# Patient Record
Sex: Female | Born: 1960
Health system: Southern US, Community
[De-identification: ages and names within clinical notes are randomized; demographics above are authoritative.]

## PROBLEM LIST (undated history)

## (undated) DIAGNOSIS — I639 Cerebral infarction, unspecified: Secondary | ICD-10-CM

## (undated) DIAGNOSIS — I10 Essential (primary) hypertension: Secondary | ICD-10-CM

## (undated) DIAGNOSIS — R569 Unspecified convulsions: Secondary | ICD-10-CM

## (undated) HISTORY — PX: TRACHEOSTOMY CLOSURE: SHX458

---

## 2009-08-28 ENCOUNTER — Inpatient Hospital Stay (HOSPITAL_COMMUNITY): Admission: EM | Admit: 2009-08-28 | Discharge: 2009-09-16 | Payer: Self-pay | Admitting: Emergency Medicine

## 2009-08-28 ENCOUNTER — Ambulatory Visit: Payer: Self-pay | Admitting: Cardiovascular Disease

## 2009-08-29 ENCOUNTER — Encounter (INDEPENDENT_AMBULATORY_CARE_PROVIDER_SITE_OTHER): Payer: Self-pay | Admitting: Neurology

## 2009-08-29 ENCOUNTER — Ambulatory Visit: Payer: Self-pay | Admitting: Internal Medicine

## 2009-09-04 ENCOUNTER — Encounter (INDEPENDENT_AMBULATORY_CARE_PROVIDER_SITE_OTHER): Payer: Self-pay | Admitting: Neurology

## 2009-09-05 ENCOUNTER — Ambulatory Visit: Payer: Self-pay | Admitting: Vascular Surgery

## 2009-09-05 ENCOUNTER — Encounter (INDEPENDENT_AMBULATORY_CARE_PROVIDER_SITE_OTHER): Payer: Self-pay | Admitting: Neurology

## 2009-09-07 ENCOUNTER — Ambulatory Visit: Payer: Self-pay | Admitting: Internal Medicine

## 2009-09-13 ENCOUNTER — Encounter: Payer: Self-pay | Admitting: Gastroenterology

## 2009-09-14 ENCOUNTER — Ambulatory Visit: Payer: Self-pay | Admitting: Physical Medicine & Rehabilitation

## 2009-09-16 ENCOUNTER — Inpatient Hospital Stay: Admission: EM | Admit: 2009-09-16 | Discharge: 2009-10-20 | Payer: Self-pay | Admitting: *Deleted

## 2009-09-30 ENCOUNTER — Ambulatory Visit: Payer: Self-pay | Admitting: Pulmonary Disease

## 2009-10-20 ENCOUNTER — Ambulatory Visit: Payer: Self-pay | Admitting: Physical Medicine & Rehabilitation

## 2009-10-20 ENCOUNTER — Inpatient Hospital Stay (HOSPITAL_COMMUNITY)
Admission: RE | Admit: 2009-10-20 | Discharge: 2009-11-16 | Payer: Self-pay | Admitting: Physical Medicine & Rehabilitation

## 2009-10-21 ENCOUNTER — Ambulatory Visit: Payer: Self-pay | Admitting: Physical Medicine & Rehabilitation

## 2009-12-27 ENCOUNTER — Encounter
Admission: RE | Admit: 2009-12-27 | Discharge: 2010-03-27 | Payer: Self-pay | Admitting: Physical Medicine & Rehabilitation

## 2009-12-28 ENCOUNTER — Ambulatory Visit: Payer: Self-pay | Admitting: Physical Medicine & Rehabilitation

## 2010-01-13 ENCOUNTER — Emergency Department (HOSPITAL_BASED_OUTPATIENT_CLINIC_OR_DEPARTMENT_OTHER): Admission: EM | Admit: 2010-01-13 | Discharge: 2010-01-13 | Payer: Self-pay | Admitting: Emergency Medicine

## 2010-01-25 ENCOUNTER — Encounter
Admission: RE | Admit: 2010-01-25 | Discharge: 2010-04-25 | Payer: Self-pay | Admitting: Physical Medicine & Rehabilitation

## 2010-02-15 ENCOUNTER — Ambulatory Visit: Payer: Self-pay | Admitting: Physical Medicine & Rehabilitation

## 2010-03-29 ENCOUNTER — Ambulatory Visit: Payer: Self-pay | Admitting: Psychology

## 2010-05-12 ENCOUNTER — Encounter
Admission: RE | Admit: 2010-05-12 | Discharge: 2010-05-17 | Payer: Self-pay | Source: Home / Self Care | Attending: Physical Medicine & Rehabilitation | Admitting: Physical Medicine & Rehabilitation

## 2010-05-17 ENCOUNTER — Ambulatory Visit: Payer: Self-pay | Admitting: Physical Medicine & Rehabilitation

## 2010-05-25 ENCOUNTER — Ambulatory Visit: Payer: Self-pay | Admitting: Cardiology

## 2010-08-03 ENCOUNTER — Inpatient Hospital Stay (HOSPITAL_COMMUNITY): Admission: EM | Admit: 2010-08-03 | Discharge: 2010-05-26 | Payer: Self-pay | Admitting: Emergency Medicine

## 2010-08-04 ENCOUNTER — Encounter
Admission: RE | Admit: 2010-08-04 | Discharge: 2010-08-09 | Payer: Self-pay | Source: Home / Self Care | Attending: Physical Medicine & Rehabilitation | Admitting: Physical Medicine & Rehabilitation

## 2010-08-09 ENCOUNTER — Ambulatory Visit: Payer: Self-pay | Admitting: Physical Medicine & Rehabilitation

## 2010-09-26 NOTE — Procedures (Signed)
Summary: [facility]  [facility]   Imported By: Sherian Rein 09/19/2009 12:45:21  _____________________________________________________________________  External Attachment:    Type:   Image     Comment:   External Document

## 2010-10-09 ENCOUNTER — Emergency Department (HOSPITAL_COMMUNITY): Payer: BC Managed Care – PPO

## 2010-10-09 ENCOUNTER — Emergency Department (HOSPITAL_COMMUNITY)
Admission: EM | Admit: 2010-10-09 | Discharge: 2010-10-10 | Disposition: A | Discharge status: Home / Self Care | Payer: BC Managed Care – PPO | Attending: Emergency Medicine | Admitting: Emergency Medicine

## 2010-10-09 DIAGNOSIS — R5383 Other fatigue: Secondary | ICD-10-CM | POA: Insufficient documentation

## 2010-10-09 DIAGNOSIS — F29 Unspecified psychosis not due to a substance or known physiological condition: Secondary | ICD-10-CM | POA: Insufficient documentation

## 2010-10-09 DIAGNOSIS — I440 Atrioventricular block, first degree: Secondary | ICD-10-CM | POA: Insufficient documentation

## 2010-10-09 DIAGNOSIS — I10 Essential (primary) hypertension: Secondary | ICD-10-CM | POA: Insufficient documentation

## 2010-10-09 DIAGNOSIS — E039 Hypothyroidism, unspecified: Secondary | ICD-10-CM | POA: Insufficient documentation

## 2010-10-09 DIAGNOSIS — R569 Unspecified convulsions: Secondary | ICD-10-CM | POA: Insufficient documentation

## 2010-10-09 DIAGNOSIS — R5381 Other malaise: Secondary | ICD-10-CM | POA: Insufficient documentation

## 2010-10-09 DIAGNOSIS — I498 Other specified cardiac arrhythmias: Secondary | ICD-10-CM | POA: Insufficient documentation

## 2010-10-09 DIAGNOSIS — E119 Type 2 diabetes mellitus without complications: Secondary | ICD-10-CM | POA: Insufficient documentation

## 2010-10-09 DIAGNOSIS — Z8679 Personal history of other diseases of the circulatory system: Secondary | ICD-10-CM | POA: Insufficient documentation

## 2010-10-09 DIAGNOSIS — R51 Headache: Secondary | ICD-10-CM | POA: Insufficient documentation

## 2010-10-09 LAB — URINALYSIS, ROUTINE W REFLEX MICROSCOPIC
Bilirubin Urine: NEGATIVE
Nitrite: NEGATIVE
Specific Gravity, Urine: 1.028 (ref 1.005–1.030)
pH: 5.5 (ref 5.0–8.0)

## 2010-10-09 LAB — CBC
HCT: 37.3 % (ref 36.0–46.0)
Hemoglobin: 12.5 g/dL (ref 12.0–15.0)
MCH: 27.1 pg (ref 26.0–34.0)
MCV: 80.9 fL (ref 78.0–100.0)
RBC: 4.61 MIL/uL (ref 3.87–5.11)

## 2010-10-09 LAB — PROTIME-INR: Prothrombin Time: 25 seconds — ABNORMAL HIGH (ref 11.6–15.2)

## 2010-10-09 LAB — DIFFERENTIAL
Basophils Relative: 1 % (ref 0–1)
Eosinophils Relative: 1 % (ref 0–5)
Lymphocytes Relative: 23 % (ref 12–46)
Monocytes Relative: 7 % (ref 3–12)
Neutrophils Relative %: 68 % (ref 43–77)

## 2010-10-09 LAB — BASIC METABOLIC PANEL
CO2: 24 mEq/L (ref 19–32)
GFR calc non Af Amer: >60 mL/min (ref >60)
Glucose, Bld: 133 mg/dL — ABNORMAL HIGH (ref 70–99)
Potassium: 4.1 mEq/L (ref 3.5–5.1)
Sodium: 139 mEq/L (ref 135–145)

## 2010-10-09 LAB — URINE MICROSCOPIC-ADD ON

## 2010-10-25 ENCOUNTER — Ambulatory Visit: Payer: Self-pay | Admitting: Physical Medicine & Rehabilitation

## 2010-11-09 LAB — FERRITIN: Ferritin: 3 ng/mL — ABNORMAL LOW (ref 10–291)

## 2010-11-09 LAB — BASIC METABOLIC PANEL
BUN: 6 mg/dL (ref 6–23)
Calcium: 9 mg/dL (ref 8.4–10.5)
Chloride: 106 mEq/L (ref 96–112)
GFR calc Af Amer: >60 mL/min (ref >60)
GFR calc non Af Amer: >60 mL/min (ref >60)
GFR calc non Af Amer: >60 mL/min (ref >60)
Glucose, Bld: 73 mg/dL (ref 70–99)
Potassium: 3.7 mEq/L (ref 3.5–5.1)
Potassium: 3.7 mEq/L (ref 3.5–5.1)
Sodium: 135 mEq/L (ref 135–145)
Sodium: 137 mEq/L (ref 135–145)

## 2010-11-09 LAB — CBC
HCT: 22.4 % — ABNORMAL LOW (ref 36.0–46.0)
MCH: 16.4 pg — ABNORMAL LOW (ref 26.0–34.0)
MCHC: 26.8 g/dL — ABNORMAL LOW (ref 30.0–36.0)
MCHC: 27.4 g/dL — ABNORMAL LOW (ref 30.0–36.0)
MCHC: 28.9 g/dL — ABNORMAL LOW (ref 30.0–36.0)
MCV: 59.9 fL — ABNORMAL LOW (ref 78.0–100.0)
Platelets: 312 10*3/uL (ref 150–400)
Platelets: 339 10*3/uL (ref 150–400)
RDW: 20.6 % — ABNORMAL HIGH (ref 11.5–15.5)
RDW: 20.8 % — ABNORMAL HIGH (ref 11.5–15.5)
RDW: 23.8 % — ABNORMAL HIGH (ref 11.5–15.5)
WBC: 4.5 10*3/uL (ref 4.0–10.5)
WBC: 6.9 10*3/uL (ref 4.0–10.5)
WBC: 9.8 10*3/uL (ref 4.0–10.5)

## 2010-11-09 LAB — DIFFERENTIAL
Basophils Relative: 1 % (ref 0–1)
Eosinophils Relative: 0 % (ref 0–5)
Lymphocytes Relative: 11 % — ABNORMAL LOW (ref 12–46)
Monocytes Relative: 5 % (ref 3–12)
Neutrophils Relative %: 83 % — ABNORMAL HIGH (ref 43–77)

## 2010-11-09 LAB — TSH: TSH: 1.404 u[IU]/mL (ref 0.350–4.500)

## 2010-11-09 LAB — RETICULOCYTES: RBC.: 3.84 MIL/uL — ABNORMAL LOW (ref 3.87–5.11)

## 2010-11-09 LAB — TYPE AND SCREEN: Antibody Screen: NEGATIVE

## 2010-11-09 LAB — CARDIAC PANEL(CRET KIN+CKTOT+MB+TROPI)
CK, MB: 1.1 ng/mL (ref 0.3–4.0)
Relative Index: 0.9 (ref 0.0–2.5)
Relative Index: 1.3 (ref 0.0–2.5)
Total CK: 131 U/L (ref 7–177)
Troponin I: 0.02 ng/mL (ref 0.00–0.06)
Troponin I: 0.03 ng/mL (ref 0.00–0.06)

## 2010-11-09 LAB — GLUCOSE, CAPILLARY
Glucose-Capillary: 104 mg/dL — ABNORMAL HIGH (ref 70–99)
Glucose-Capillary: 106 mg/dL — ABNORMAL HIGH (ref 70–99)
Glucose-Capillary: 116 mg/dL — ABNORMAL HIGH (ref 70–99)
Glucose-Capillary: 116 mg/dL — ABNORMAL HIGH (ref 70–99)
Glucose-Capillary: 65 mg/dL — ABNORMAL LOW (ref 70–99)
Glucose-Capillary: 83 mg/dL (ref 70–99)

## 2010-11-09 LAB — COMPREHENSIVE METABOLIC PANEL
Albumin: 3.3 g/dL — ABNORMAL LOW (ref 3.5–5.2)
BUN: 7 mg/dL (ref 6–23)
Calcium: 8.7 mg/dL (ref 8.4–10.5)
Creatinine, Ser: 0.63 mg/dL (ref 0.4–1.2)
Potassium: 4 mEq/L (ref 3.5–5.1)
Total Protein: 7.8 g/dL (ref 6.0–8.3)

## 2010-11-09 LAB — IRON AND TIBC
Iron: <10 ug/dL — ABNORMAL LOW (ref 42–135)
UIBC: 388 ug/dL

## 2010-11-09 LAB — PROTIME-INR
INR: 2.61 — ABNORMAL HIGH (ref 0.00–1.49)
Prothrombin Time: 28 seconds — ABNORMAL HIGH (ref 11.6–15.2)

## 2010-11-09 LAB — VITAMIN B12: Vitamin B-12: 567 pg/mL (ref 211–911)

## 2010-11-09 LAB — HEMOCCULT GUIAC POC 1CARD (OFFICE): Fecal Occult Bld: NEGATIVE

## 2010-11-12 LAB — BASIC METABOLIC PANEL
BUN: 10 mg/dL (ref 6–23)
BUN: 10 mg/dL (ref 6–23)
BUN: 12 mg/dL (ref 6–23)
BUN: 12 mg/dL (ref 6–23)
BUN: 19 mg/dL (ref 6–23)
CO2: 23 mEq/L (ref 19–32)
CO2: 24 mEq/L (ref 19–32)
CO2: 25 mEq/L (ref 19–32)
CO2: 25 mEq/L (ref 19–32)
CO2: 25 mEq/L (ref 19–32)
CO2: 26 mEq/L (ref 19–32)
CO2: 28 mEq/L (ref 19–32)
Calcium: 8.1 mg/dL — ABNORMAL LOW (ref 8.4–10.5)
Calcium: 8.2 mg/dL — ABNORMAL LOW (ref 8.4–10.5)
Calcium: 8.3 mg/dL — ABNORMAL LOW (ref 8.4–10.5)
Calcium: 8.4 mg/dL (ref 8.4–10.5)
Calcium: 8.4 mg/dL (ref 8.4–10.5)
Calcium: 8.5 mg/dL (ref 8.4–10.5)
Chloride: 115 mEq/L — ABNORMAL HIGH (ref 96–112)
Chloride: 119 mEq/L — ABNORMAL HIGH (ref 96–112)
Creatinine, Ser: 0.61 mg/dL (ref 0.4–1.2)
Creatinine, Ser: 0.61 mg/dL (ref 0.4–1.2)
Creatinine, Ser: 0.63 mg/dL (ref 0.4–1.2)
Creatinine, Ser: 0.69 mg/dL (ref 0.4–1.2)
Creatinine, Ser: 0.7 mg/dL (ref 0.4–1.2)
GFR calc Af Amer: >60 mL/min (ref >60)
GFR calc Af Amer: >60 mL/min (ref >60)
GFR calc Af Amer: >60 mL/min (ref >60)
GFR calc Af Amer: >60 mL/min (ref >60)
GFR calc Af Amer: >60 mL/min (ref >60)
GFR calc non Af Amer: >60 mL/min (ref >60)
GFR calc non Af Amer: >60 mL/min (ref >60)
GFR calc non Af Amer: >60 mL/min (ref >60)
GFR calc non Af Amer: >60 mL/min (ref >60)
GFR calc non Af Amer: >60 mL/min (ref >60)
Glucose, Bld: 161 mg/dL — ABNORMAL HIGH (ref 70–99)
Glucose, Bld: 166 mg/dL — ABNORMAL HIGH (ref 70–99)
Glucose, Bld: 176 mg/dL — ABNORMAL HIGH (ref 70–99)
Glucose, Bld: 190 mg/dL — ABNORMAL HIGH (ref 70–99)
Potassium: 3 mEq/L — ABNORMAL LOW (ref 3.5–5.1)
Potassium: 3.3 mEq/L — ABNORMAL LOW (ref 3.5–5.1)
Potassium: 3.5 mEq/L (ref 3.5–5.1)
Potassium: 3.5 mEq/L (ref 3.5–5.1)
Potassium: 3.7 mEq/L (ref 3.5–5.1)
Sodium: 138 mEq/L (ref 135–145)
Sodium: 150 mEq/L — ABNORMAL HIGH (ref 135–145)
Sodium: 154 mEq/L — ABNORMAL HIGH (ref 135–145)
Sodium: 155 mEq/L — ABNORMAL HIGH (ref 135–145)

## 2010-11-12 LAB — GLUCOSE, CAPILLARY
Glucose-Capillary: 115 mg/dL — ABNORMAL HIGH (ref 70–99)
Glucose-Capillary: 119 mg/dL — ABNORMAL HIGH (ref 70–99)
Glucose-Capillary: 124 mg/dL — ABNORMAL HIGH (ref 70–99)
Glucose-Capillary: 129 mg/dL — ABNORMAL HIGH (ref 70–99)
Glucose-Capillary: 130 mg/dL — ABNORMAL HIGH (ref 70–99)
Glucose-Capillary: 133 mg/dL — ABNORMAL HIGH (ref 70–99)
Glucose-Capillary: 133 mg/dL — ABNORMAL HIGH (ref 70–99)
Glucose-Capillary: 133 mg/dL — ABNORMAL HIGH (ref 70–99)
Glucose-Capillary: 134 mg/dL — ABNORMAL HIGH (ref 70–99)
Glucose-Capillary: 134 mg/dL — ABNORMAL HIGH (ref 70–99)
Glucose-Capillary: 136 mg/dL — ABNORMAL HIGH (ref 70–99)
Glucose-Capillary: 137 mg/dL — ABNORMAL HIGH (ref 70–99)
Glucose-Capillary: 137 mg/dL — ABNORMAL HIGH (ref 70–99)
Glucose-Capillary: 139 mg/dL — ABNORMAL HIGH (ref 70–99)
Glucose-Capillary: 139 mg/dL — ABNORMAL HIGH (ref 70–99)
Glucose-Capillary: 139 mg/dL — ABNORMAL HIGH (ref 70–99)
Glucose-Capillary: 140 mg/dL — ABNORMAL HIGH (ref 70–99)
Glucose-Capillary: 141 mg/dL — ABNORMAL HIGH (ref 70–99)
Glucose-Capillary: 142 mg/dL — ABNORMAL HIGH (ref 70–99)
Glucose-Capillary: 144 mg/dL — ABNORMAL HIGH (ref 70–99)
Glucose-Capillary: 144 mg/dL — ABNORMAL HIGH (ref 70–99)
Glucose-Capillary: 144 mg/dL — ABNORMAL HIGH (ref 70–99)
Glucose-Capillary: 144 mg/dL — ABNORMAL HIGH (ref 70–99)
Glucose-Capillary: 144 mg/dL — ABNORMAL HIGH (ref 70–99)
Glucose-Capillary: 144 mg/dL — ABNORMAL HIGH (ref 70–99)
Glucose-Capillary: 144 mg/dL — ABNORMAL HIGH (ref 70–99)
Glucose-Capillary: 144 mg/dL — ABNORMAL HIGH (ref 70–99)
Glucose-Capillary: 145 mg/dL — ABNORMAL HIGH (ref 70–99)
Glucose-Capillary: 145 mg/dL — ABNORMAL HIGH (ref 70–99)
Glucose-Capillary: 146 mg/dL — ABNORMAL HIGH (ref 70–99)
Glucose-Capillary: 147 mg/dL — ABNORMAL HIGH (ref 70–99)
Glucose-Capillary: 148 mg/dL — ABNORMAL HIGH (ref 70–99)
Glucose-Capillary: 149 mg/dL — ABNORMAL HIGH (ref 70–99)
Glucose-Capillary: 150 mg/dL — ABNORMAL HIGH (ref 70–99)
Glucose-Capillary: 152 mg/dL — ABNORMAL HIGH (ref 70–99)
Glucose-Capillary: 153 mg/dL — ABNORMAL HIGH (ref 70–99)
Glucose-Capillary: 154 mg/dL — ABNORMAL HIGH (ref 70–99)
Glucose-Capillary: 156 mg/dL — ABNORMAL HIGH (ref 70–99)
Glucose-Capillary: 157 mg/dL — ABNORMAL HIGH (ref 70–99)
Glucose-Capillary: 157 mg/dL — ABNORMAL HIGH (ref 70–99)
Glucose-Capillary: 158 mg/dL — ABNORMAL HIGH (ref 70–99)
Glucose-Capillary: 159 mg/dL — ABNORMAL HIGH (ref 70–99)
Glucose-Capillary: 159 mg/dL — ABNORMAL HIGH (ref 70–99)
Glucose-Capillary: 160 mg/dL — ABNORMAL HIGH (ref 70–99)
Glucose-Capillary: 160 mg/dL — ABNORMAL HIGH (ref 70–99)
Glucose-Capillary: 160 mg/dL — ABNORMAL HIGH (ref 70–99)
Glucose-Capillary: 161 mg/dL — ABNORMAL HIGH (ref 70–99)
Glucose-Capillary: 161 mg/dL — ABNORMAL HIGH (ref 70–99)
Glucose-Capillary: 161 mg/dL — ABNORMAL HIGH (ref 70–99)
Glucose-Capillary: 161 mg/dL — ABNORMAL HIGH (ref 70–99)
Glucose-Capillary: 164 mg/dL — ABNORMAL HIGH (ref 70–99)
Glucose-Capillary: 164 mg/dL — ABNORMAL HIGH (ref 70–99)
Glucose-Capillary: 164 mg/dL — ABNORMAL HIGH (ref 70–99)
Glucose-Capillary: 165 mg/dL — ABNORMAL HIGH (ref 70–99)
Glucose-Capillary: 166 mg/dL — ABNORMAL HIGH (ref 70–99)
Glucose-Capillary: 168 mg/dL — ABNORMAL HIGH (ref 70–99)
Glucose-Capillary: 168 mg/dL — ABNORMAL HIGH (ref 70–99)
Glucose-Capillary: 169 mg/dL — ABNORMAL HIGH (ref 70–99)
Glucose-Capillary: 174 mg/dL — ABNORMAL HIGH (ref 70–99)
Glucose-Capillary: 175 mg/dL — ABNORMAL HIGH (ref 70–99)
Glucose-Capillary: 178 mg/dL — ABNORMAL HIGH (ref 70–99)
Glucose-Capillary: 178 mg/dL — ABNORMAL HIGH (ref 70–99)
Glucose-Capillary: 178 mg/dL — ABNORMAL HIGH (ref 70–99)
Glucose-Capillary: 178 mg/dL — ABNORMAL HIGH (ref 70–99)
Glucose-Capillary: 179 mg/dL — ABNORMAL HIGH (ref 70–99)
Glucose-Capillary: 182 mg/dL — ABNORMAL HIGH (ref 70–99)
Glucose-Capillary: 184 mg/dL — ABNORMAL HIGH (ref 70–99)
Glucose-Capillary: 184 mg/dL — ABNORMAL HIGH (ref 70–99)
Glucose-Capillary: 186 mg/dL — ABNORMAL HIGH (ref 70–99)
Glucose-Capillary: 186 mg/dL — ABNORMAL HIGH (ref 70–99)
Glucose-Capillary: 186 mg/dL — ABNORMAL HIGH (ref 70–99)
Glucose-Capillary: 187 mg/dL — ABNORMAL HIGH (ref 70–99)
Glucose-Capillary: 190 mg/dL — ABNORMAL HIGH (ref 70–99)
Glucose-Capillary: 192 mg/dL — ABNORMAL HIGH (ref 70–99)
Glucose-Capillary: 193 mg/dL — ABNORMAL HIGH (ref 70–99)
Glucose-Capillary: 194 mg/dL — ABNORMAL HIGH (ref 70–99)
Glucose-Capillary: 195 mg/dL — ABNORMAL HIGH (ref 70–99)
Glucose-Capillary: 198 mg/dL — ABNORMAL HIGH (ref 70–99)
Glucose-Capillary: 199 mg/dL — ABNORMAL HIGH (ref 70–99)
Glucose-Capillary: 201 mg/dL — ABNORMAL HIGH (ref 70–99)
Glucose-Capillary: 209 mg/dL — ABNORMAL HIGH (ref 70–99)
Glucose-Capillary: 210 mg/dL — ABNORMAL HIGH (ref 70–99)
Glucose-Capillary: 210 mg/dL — ABNORMAL HIGH (ref 70–99)
Glucose-Capillary: 213 mg/dL — ABNORMAL HIGH (ref 70–99)
Glucose-Capillary: 213 mg/dL — ABNORMAL HIGH (ref 70–99)
Glucose-Capillary: 214 mg/dL — ABNORMAL HIGH (ref 70–99)
Glucose-Capillary: 215 mg/dL — ABNORMAL HIGH (ref 70–99)
Glucose-Capillary: 217 mg/dL — ABNORMAL HIGH (ref 70–99)
Glucose-Capillary: 219 mg/dL — ABNORMAL HIGH (ref 70–99)
Glucose-Capillary: 219 mg/dL — ABNORMAL HIGH (ref 70–99)
Glucose-Capillary: 239 mg/dL — ABNORMAL HIGH (ref 70–99)
Glucose-Capillary: 242 mg/dL — ABNORMAL HIGH (ref 70–99)
Glucose-Capillary: 245 mg/dL — ABNORMAL HIGH (ref 70–99)
Glucose-Capillary: 247 mg/dL — ABNORMAL HIGH (ref 70–99)
Glucose-Capillary: 252 mg/dL — ABNORMAL HIGH (ref 70–99)
Glucose-Capillary: 259 mg/dL — ABNORMAL HIGH (ref 70–99)
Glucose-Capillary: 270 mg/dL — ABNORMAL HIGH (ref 70–99)
Glucose-Capillary: 281 mg/dL — ABNORMAL HIGH (ref 70–99)
Glucose-Capillary: 283 mg/dL — ABNORMAL HIGH (ref 70–99)
Glucose-Capillary: 289 mg/dL — ABNORMAL HIGH (ref 70–99)

## 2010-11-12 LAB — BLOOD GAS, ARTERIAL
Acid-Base Excess: 1.5 mmol/L (ref 0.0–2.0)
Acid-Base Excess: 2.3 mmol/L — ABNORMAL HIGH (ref 0.0–2.0)
Acid-Base Excess: 2.8 mmol/L — ABNORMAL HIGH (ref 0.0–2.0)
Acid-base deficit: 0.3 mmol/L (ref 0.0–2.0)
Bicarbonate: 23.1 mEq/L (ref 20.0–24.0)
Bicarbonate: 23.3 mEq/L (ref 20.0–24.0)
Bicarbonate: 24.7 mEq/L — ABNORMAL HIGH (ref 20.0–24.0)
Bicarbonate: 26.2 mEq/L — ABNORMAL HIGH (ref 20.0–24.0)
Drawn by: 24487
Drawn by: 29017
FIO2: 0.3 %
FIO2: 0.3 %
FIO2: 0.3 %
FIO2: 0.3 %
MECHVT: 440 mL
MECHVT: 440 mL
MECHVT: 440 mL
MECHVT: 440 mL
O2 Saturation: 96 %
O2 Saturation: 96.2 %
O2 Saturation: 96.9 %
PEEP: 5 cmH2O
PEEP: 5 cmH2O
PEEP: 5 cmH2O
PEEP: 5 cmH2O
PEEP: 5 cmH2O
PEEP: 5 cmH2O
PEEP: 5 cmH2O
Patient temperature: 100.1
Patient temperature: 98.2
Patient temperature: 98.6
Patient temperature: 98.6
Patient temperature: 98.6
RATE: 12 resp/min
RATE: 14 resp/min
RATE: 14 resp/min
RATE: 14 resp/min
TCO2: 22.7 mmol/L (ref 0–100)
TCO2: 24.1 mmol/L (ref 0–100)
TCO2: 24.8 mmol/L (ref 0–100)
TCO2: 26.2 mmol/L (ref 0–100)
TCO2: 26.6 mmol/L (ref 0–100)
TCO2: 27.8 mmol/L (ref 0–100)
pCO2 arterial: 33.9 mmHg — ABNORMAL LOW (ref 35.0–45.0)
pCO2 arterial: 35.3 mmHg (ref 35.0–45.0)
pCO2 arterial: 36.1 mmHg (ref 35.0–45.0)
pCO2 arterial: 36.6 mmHg (ref 35.0–45.0)
pCO2 arterial: 37.9 mmHg (ref 35.0–45.0)
pCO2 arterial: 38.2 mmHg (ref 35.0–45.0)
pCO2 arterial: 39.1 mmHg (ref 35.0–45.0)
pH, Arterial: 7.393 (ref 7.350–7.400)
pH, Arterial: 7.402 — ABNORMAL HIGH (ref 7.350–7.400)
pH, Arterial: 7.445 — ABNORMAL HIGH (ref 7.350–7.400)
pH, Arterial: 7.452 — ABNORMAL HIGH (ref 7.350–7.400)
pH, Arterial: 7.456 — ABNORMAL HIGH (ref 7.350–7.400)
pH, Arterial: 7.477 — ABNORMAL HIGH (ref 7.350–7.400)
pH, Arterial: 7.483 — ABNORMAL HIGH (ref 7.350–7.400)
pO2, Arterial: 419 mmHg — ABNORMAL HIGH (ref 80.0–100.0)
pO2, Arterial: 71.5 mmHg — ABNORMAL LOW (ref 80.0–100.0)
pO2, Arterial: 72.6 mmHg — ABNORMAL LOW (ref 80.0–100.0)
pO2, Arterial: 73.9 mmHg — ABNORMAL LOW (ref 80.0–100.0)
pO2, Arterial: 80.4 mmHg (ref 80.0–100.0)
pO2, Arterial: 84 mmHg (ref 80.0–100.0)
pO2, Arterial: 93.1 mmHg (ref 80.0–100.0)

## 2010-11-12 LAB — CBC
HCT: 21.7 % — ABNORMAL LOW (ref 36.0–46.0)
HCT: 24.1 % — ABNORMAL LOW (ref 36.0–46.0)
HCT: 24.1 % — ABNORMAL LOW (ref 36.0–46.0)
HCT: 27.6 % — ABNORMAL LOW (ref 36.0–46.0)
HCT: 30.6 % — ABNORMAL LOW (ref 36.0–46.0)
HCT: 35.2 % — ABNORMAL LOW (ref 36.0–46.0)
Hemoglobin: 10.4 g/dL — ABNORMAL LOW (ref 12.0–15.0)
Hemoglobin: 11.8 g/dL — ABNORMAL LOW (ref 12.0–15.0)
Hemoglobin: 7.4 g/dL — ABNORMAL LOW (ref 12.0–15.0)
Hemoglobin: 7.6 g/dL — ABNORMAL LOW (ref 12.0–15.0)
Hemoglobin: 8.1 g/dL — ABNORMAL LOW (ref 12.0–15.0)
Hemoglobin: 9.3 g/dL — ABNORMAL LOW (ref 12.0–15.0)
Hemoglobin: 9.3 g/dL — ABNORMAL LOW (ref 12.0–15.0)
MCHC: 33.1 g/dL (ref 30.0–36.0)
MCHC: 33.3 g/dL (ref 30.0–36.0)
MCHC: 33.6 g/dL (ref 30.0–36.0)
MCHC: 33.6 g/dL (ref 30.0–36.0)
MCHC: 33.7 g/dL (ref 30.0–36.0)
MCHC: 33.7 g/dL (ref 30.0–36.0)
MCHC: 33.8 g/dL (ref 30.0–36.0)
MCHC: 33.8 g/dL (ref 30.0–36.0)
MCHC: 34 g/dL (ref 30.0–36.0)
MCHC: 34.2 g/dL (ref 30.0–36.0)
MCV: 84.6 fL (ref 78.0–100.0)
MCV: 85.2 fL (ref 78.0–100.0)
MCV: 85.5 fL (ref 78.0–100.0)
MCV: 85.6 fL (ref 78.0–100.0)
MCV: 85.8 fL (ref 78.0–100.0)
Platelets: 200 10*3/uL (ref 150–400)
Platelets: 243 10*3/uL (ref 150–400)
Platelets: 256 10*3/uL (ref 150–400)
Platelets: 329 10*3/uL (ref 150–400)
Platelets: 353 10*3/uL (ref 150–400)
RBC: 2.53 MIL/uL — ABNORMAL LOW (ref 3.87–5.11)
RBC: 2.66 MIL/uL — ABNORMAL LOW (ref 3.87–5.11)
RBC: 2.78 MIL/uL — ABNORMAL LOW (ref 3.87–5.11)
RBC: 3.2 MIL/uL — ABNORMAL LOW (ref 3.87–5.11)
RBC: 3.58 MIL/uL — ABNORMAL LOW (ref 3.87–5.11)
RBC: 4.03 MIL/uL (ref 3.87–5.11)
RBC: 4.16 MIL/uL (ref 3.87–5.11)
RDW: 14.4 % (ref 11.5–15.5)
RDW: 14.9 % (ref 11.5–15.5)
RDW: 15.3 % (ref 11.5–15.5)
RDW: 15.4 % (ref 11.5–15.5)
RDW: 15.7 % — ABNORMAL HIGH (ref 11.5–15.5)
WBC: 10.8 10*3/uL — ABNORMAL HIGH (ref 4.0–10.5)
WBC: 12.6 10*3/uL — ABNORMAL HIGH (ref 4.0–10.5)
WBC: 13.8 10*3/uL — ABNORMAL HIGH (ref 4.0–10.5)
WBC: 14.4 10*3/uL — ABNORMAL HIGH (ref 4.0–10.5)

## 2010-11-12 LAB — CULTURE, BLOOD (ROUTINE X 2): Culture: NO GROWTH

## 2010-11-12 LAB — COMPREHENSIVE METABOLIC PANEL
ALT: 15 U/L (ref 0–35)
ALT: 16 U/L (ref 0–35)
ALT: 17 U/L (ref 0–35)
AST: 20 U/L (ref 0–37)
Albumin: 2.8 g/dL — ABNORMAL LOW (ref 3.5–5.2)
Alkaline Phosphatase: 39 U/L (ref 39–117)
Alkaline Phosphatase: 42 U/L (ref 39–117)
Alkaline Phosphatase: 56 U/L (ref 39–117)
BUN: 8 mg/dL (ref 6–23)
CO2: 23 mEq/L (ref 19–32)
CO2: 24 mEq/L (ref 19–32)
CO2: 28 mEq/L (ref 19–32)
Calcium: 8 mg/dL — ABNORMAL LOW (ref 8.4–10.5)
Calcium: 8.2 mg/dL — ABNORMAL LOW (ref 8.4–10.5)
Calcium: 8.6 mg/dL (ref 8.4–10.5)
Chloride: 103 mEq/L (ref 96–112)
Chloride: 113 mEq/L — ABNORMAL HIGH (ref 96–112)
Chloride: 120 mEq/L — ABNORMAL HIGH (ref 96–112)
Creatinine, Ser: 0.68 mg/dL (ref 0.4–1.2)
GFR calc Af Amer: >60 mL/min (ref >60)
GFR calc non Af Amer: >60 mL/min (ref >60)
GFR calc non Af Amer: >60 mL/min (ref >60)
Glucose, Bld: 253 mg/dL — ABNORMAL HIGH (ref 70–99)
Glucose, Bld: 255 mg/dL — ABNORMAL HIGH (ref 70–99)
Glucose, Bld: 288 mg/dL — ABNORMAL HIGH (ref 70–99)
Potassium: 3.1 mEq/L — ABNORMAL LOW (ref 3.5–5.1)
Potassium: 3.2 mEq/L — ABNORMAL LOW (ref 3.5–5.1)
Potassium: 4 mEq/L (ref 3.5–5.1)
Sodium: 135 mEq/L (ref 135–145)
Sodium: 142 mEq/L (ref 135–145)
Sodium: 155 mEq/L — ABNORMAL HIGH (ref 135–145)
Total Bilirubin: 0.1 mg/dL — ABNORMAL LOW (ref 0.3–1.2)
Total Bilirubin: 0.3 mg/dL (ref 0.3–1.2)
Total Protein: 5.9 g/dL — ABNORMAL LOW (ref 6.0–8.3)
Total Protein: 7.6 g/dL (ref 6.0–8.3)

## 2010-11-12 LAB — SODIUM
Sodium: 137 mEq/L (ref 135–145)
Sodium: 139 mEq/L (ref 135–145)
Sodium: 145 mEq/L (ref 135–145)
Sodium: 147 mEq/L — ABNORMAL HIGH (ref 135–145)
Sodium: 147 mEq/L — ABNORMAL HIGH (ref 135–145)
Sodium: 148 mEq/L — ABNORMAL HIGH (ref 135–145)
Sodium: 150 mEq/L — ABNORMAL HIGH (ref 135–145)
Sodium: 152 mEq/L — ABNORMAL HIGH (ref 135–145)
Sodium: 153 mEq/L — ABNORMAL HIGH (ref 135–145)
Sodium: 154 mEq/L — ABNORMAL HIGH (ref 135–145)
Sodium: 155 mEq/L — ABNORMAL HIGH (ref 135–145)
Sodium: 158 mEq/L — ABNORMAL HIGH (ref 135–145)

## 2010-11-12 LAB — APTT: aPTT: 34 seconds (ref 24–37)

## 2010-11-12 LAB — MAGNESIUM
Magnesium: 2 mg/dL (ref 1.5–2.5)
Magnesium: 2.2 mg/dL (ref 1.5–2.5)
Magnesium: 2.2 mg/dL (ref 1.5–2.5)
Magnesium: 2.3 mg/dL (ref 1.5–2.5)
Magnesium: 2.3 mg/dL (ref 1.5–2.5)

## 2010-11-12 LAB — URINALYSIS, ROUTINE W REFLEX MICROSCOPIC
Bilirubin Urine: NEGATIVE
Bilirubin Urine: NEGATIVE
Ketones, ur: NEGATIVE mg/dL
Nitrite: NEGATIVE
Nitrite: NEGATIVE
Specific Gravity, Urine: 1.019 (ref 1.005–1.030)
Urobilinogen, UA: 0.2 mg/dL (ref 0.0–1.0)
Urobilinogen, UA: 2 mg/dL — ABNORMAL HIGH (ref 0.0–1.0)

## 2010-11-12 LAB — HEMOGLOBIN A1C: Mean Plasma Glucose: 209 mg/dL

## 2010-11-12 LAB — LIPID PANEL
LDL Cholesterol: 158 mg/dL — ABNORMAL HIGH (ref 0–99)
Triglycerides: 69 mg/dL (ref <150)
VLDL: 14 mg/dL (ref 0–40)

## 2010-11-12 LAB — CULTURE, RESPIRATORY W GRAM STAIN

## 2010-11-12 LAB — PHOSPHORUS
Phosphorus: 3.5 mg/dL (ref 2.3–4.6)
Phosphorus: 3.9 mg/dL (ref 2.3–4.6)

## 2010-11-12 LAB — DIFFERENTIAL
Basophils Absolute: 0 10*3/uL (ref 0.0–0.1)
Basophils Relative: 0 % (ref 0–1)
Eosinophils Absolute: 0 10*3/uL (ref 0.0–0.7)
Monocytes Absolute: 0.6 10*3/uL (ref 0.1–1.0)
Neutrophils Relative %: 50 % (ref 43–77)

## 2010-11-12 LAB — CULTURE, BAL-QUANTITATIVE W GRAM STAIN

## 2010-11-12 LAB — OSMOLALITY
Osmolality: 292 mOsm/kg (ref 275–300)
Osmolality: 324 mOsm/kg — ABNORMAL HIGH (ref 275–300)
Osmolality: 326 mOsm/kg — ABNORMAL HIGH (ref 275–300)
Osmolality: 332 mOsm/kg — ABNORMAL HIGH (ref 275–300)

## 2010-11-12 LAB — SEDIMENTATION RATE: Sed Rate: 8 mm/hr (ref 0–22)

## 2010-11-12 LAB — URINE CULTURE

## 2010-11-12 LAB — VANCOMYCIN, TROUGH: Vancomycin Tr: 22.7 ug/mL — ABNORMAL HIGH (ref 10.0–20.0)

## 2010-11-12 LAB — PROTIME-INR
INR: 1.14 (ref 0.00–1.49)
Prothrombin Time: 14.8 seconds (ref 11.6–15.2)

## 2010-11-12 LAB — TSH: TSH: 6.581 u[IU]/mL — ABNORMAL HIGH (ref 0.350–4.500)

## 2010-11-13 LAB — CARDIOLIPIN ANTIBODIES, IGG, IGM, IGA
Anticardiolipin IgA: <3 APL U/mL — ABNORMAL LOW (ref <10)
Anticardiolipin IgG: 4 GPL U/mL — ABNORMAL LOW (ref <10)
Anticardiolipin IgM: <3 MPL U/mL — ABNORMAL LOW (ref <10)

## 2010-11-13 LAB — GLUCOSE, CAPILLARY
Glucose-Capillary: 147 mg/dL — ABNORMAL HIGH (ref 70–99)
Glucose-Capillary: 157 mg/dL — ABNORMAL HIGH (ref 70–99)
Glucose-Capillary: 160 mg/dL — ABNORMAL HIGH (ref 70–99)
Glucose-Capillary: 161 mg/dL — ABNORMAL HIGH (ref 70–99)
Glucose-Capillary: 175 mg/dL — ABNORMAL HIGH (ref 70–99)
Glucose-Capillary: 178 mg/dL — ABNORMAL HIGH (ref 70–99)
Glucose-Capillary: 185 mg/dL — ABNORMAL HIGH (ref 70–99)
Glucose-Capillary: 186 mg/dL — ABNORMAL HIGH (ref 70–99)
Glucose-Capillary: 196 mg/dL — ABNORMAL HIGH (ref 70–99)
Glucose-Capillary: 216 mg/dL — ABNORMAL HIGH (ref 70–99)
Glucose-Capillary: 233 mg/dL — ABNORMAL HIGH (ref 70–99)

## 2010-11-13 LAB — PROTIME-INR
INR: 1.31 (ref 0.00–1.49)
INR: 1.32 (ref 0.00–1.49)
INR: 1.46 (ref 0.00–1.49)
INR: 1.46 (ref 0.00–1.49)
INR: 1.46 (ref 0.00–1.49)
Prothrombin Time: 16.2 seconds — ABNORMAL HIGH (ref 11.6–15.2)
Prothrombin Time: 16.3 seconds — ABNORMAL HIGH (ref 11.6–15.2)
Prothrombin Time: 17.6 seconds — ABNORMAL HIGH (ref 11.6–15.2)
Prothrombin Time: 17.6 seconds — ABNORMAL HIGH (ref 11.6–15.2)

## 2010-11-13 LAB — COMPREHENSIVE METABOLIC PANEL
ALT: 42 U/L — ABNORMAL HIGH (ref 0–35)
AST: 29 U/L (ref 0–37)
Albumin: 2.6 g/dL — ABNORMAL LOW (ref 3.5–5.2)
Alkaline Phosphatase: 65 U/L (ref 39–117)
Alkaline Phosphatase: 76 U/L (ref 39–117)
BUN: 19 mg/dL (ref 6–23)
CO2: 29 mEq/L (ref 19–32)
Calcium: 9.2 mg/dL (ref 8.4–10.5)
Calcium: 9.4 mg/dL (ref 8.4–10.5)
Chloride: 102 mEq/L (ref 96–112)
Chloride: 99 mEq/L (ref 96–112)
Creatinine, Ser: 0.53 mg/dL (ref 0.4–1.2)
GFR calc Af Amer: >60 mL/min (ref >60)
GFR calc non Af Amer: >60 mL/min (ref >60)
Glucose, Bld: 180 mg/dL — ABNORMAL HIGH (ref 70–99)
Glucose, Bld: 186 mg/dL — ABNORMAL HIGH (ref 70–99)
Potassium: 4.3 mEq/L (ref 3.5–5.1)
Sodium: 140 mEq/L (ref 135–145)
Total Bilirubin: 0.3 mg/dL (ref 0.3–1.2)
Total Bilirubin: 0.4 mg/dL (ref 0.3–1.2)
Total Protein: 7.1 g/dL (ref 6.0–8.3)
Total Protein: 8 g/dL (ref 6.0–8.3)

## 2010-11-13 LAB — HEMOGLOBIN A1C: Hgb A1c MFr Bld: 9 % — ABNORMAL HIGH (ref 4.6–6.1)

## 2010-11-13 LAB — CBC
HCT: 26.6 % — ABNORMAL LOW (ref 36.0–46.0)
HCT: 28.1 % — ABNORMAL LOW (ref 36.0–46.0)
HCT: 28.6 % — ABNORMAL LOW (ref 36.0–46.0)
Hemoglobin: 8.6 g/dL — ABNORMAL LOW (ref 12.0–15.0)
Hemoglobin: 9.1 g/dL — ABNORMAL LOW (ref 12.0–15.0)
MCHC: 31.9 g/dL (ref 30.0–36.0)
MCHC: 32.3 g/dL (ref 30.0–36.0)
MCHC: 32.9 g/dL (ref 30.0–36.0)
MCHC: 33.8 g/dL (ref 30.0–36.0)
MCHC: 29.8 g/dL — ABNORMAL LOW (ref 30.0–36.0)
MCV: 84.3 fL (ref 78.0–100.0)
MCV: 84.9 fL (ref 78.0–100.0)
MCV: 64.2 fL — ABNORMAL LOW (ref 78.0–100.0)
Platelets: 390 10*3/uL (ref 150–400)
Platelets: 424 10*3/uL — ABNORMAL HIGH (ref 150–400)
Platelets: 536 10*3/uL — ABNORMAL HIGH (ref 150–400)
Platelets: 612 10*3/uL — ABNORMAL HIGH (ref 150–400)
Platelets: 385 10*3/uL (ref 150–400)
RBC: 3.31 MIL/uL — ABNORMAL LOW (ref 3.87–5.11)
RDW: 15.3 % (ref 11.5–15.5)
RDW: 15.3 % (ref 11.5–15.5)
RDW: 15.5 % (ref 11.5–15.5)
RDW: 15.6 % — ABNORMAL HIGH (ref 11.5–15.5)
WBC: 4 10*3/uL (ref 4.0–10.5)
WBC: 4.8 10*3/uL (ref 4.0–10.5)

## 2010-11-13 LAB — DIFFERENTIAL
Basophils Absolute: 0 10*3/uL (ref 0.0–0.1)
Basophils Relative: 1 % (ref 0–1)
Basophils Relative: 1 % (ref 0–1)
Eosinophils Absolute: 0.2 10*3/uL (ref 0.0–0.7)
Eosinophils Absolute: 0 10*3/uL (ref 0.0–0.7)
Lymphocytes Relative: 25 % (ref 12–46)
Lymphs Abs: 1.2 10*3/uL (ref 0.7–4.0)
Lymphs Abs: 1.2 10*3/uL (ref 0.7–4.0)
Lymphs Abs: 1.3 10*3/uL (ref 0.7–4.0)
Monocytes Absolute: 0.2 10*3/uL (ref 0.1–1.0)
Monocytes Relative: 7 % (ref 3–12)
Neutro Abs: 2.9 10*3/uL (ref 1.7–7.7)
Neutro Abs: 5.8 10*3/uL (ref 1.7–7.7)
Neutrophils Relative %: 62 % (ref 43–77)
Neutrophils Relative %: 75 % (ref 43–77)
Neutrophils Relative %: 41 % — ABNORMAL LOW (ref 43–77)

## 2010-11-13 LAB — BASIC METABOLIC PANEL
BUN: 17 mg/dL (ref 6–23)
BUN: 20 mg/dL (ref 6–23)
BUN: 12 mg/dL (ref 6–23)
CO2: 24 mEq/L (ref 19–32)
CO2: 25 mEq/L (ref 19–32)
CO2: 26 mEq/L (ref 19–32)
CO2: 26 mEq/L (ref 19–32)
Calcium: 9.1 mg/dL (ref 8.4–10.5)
Chloride: 104 mEq/L (ref 96–112)
Chloride: 104 mEq/L (ref 96–112)
Chloride: 105 mEq/L (ref 96–112)
Creatinine, Ser: 0.7 mg/dL (ref 0.4–1.2)
Creatinine, Ser: 0.85 mg/dL (ref 0.4–1.2)
Creatinine, Ser: 0.6 mg/dL (ref 0.4–1.2)
GFR calc Af Amer: >60 mL/min (ref >60)
GFR calc Af Amer: >60 mL/min (ref >60)
Glucose, Bld: 157 mg/dL — ABNORMAL HIGH (ref 70–99)
Glucose, Bld: 193 mg/dL — ABNORMAL HIGH (ref 70–99)
Potassium: 3.8 mEq/L (ref 3.5–5.1)
Potassium: 3.8 mEq/L (ref 3.5–5.1)
Sodium: 141 mEq/L (ref 135–145)

## 2010-11-13 LAB — BLOOD GAS, ARTERIAL
Acid-Base Excess: 1 mmol/L (ref 0.0–2.0)
Acid-base deficit: 1.6 mmol/L (ref 0.0–2.0)
Bicarbonate: 22.5 mEq/L (ref 20.0–24.0)
Bicarbonate: 24.4 mEq/L — ABNORMAL HIGH (ref 20.0–24.0)
Drawn by: 270091
FIO2: 0.28 %
FIO2: 0.3 %
MECHVT: 500 mL
MECHVT: 500 mL
Patient temperature: 100.3
Patient temperature: 100.6
Patient temperature: 98.6
TCO2: 23.6 mmol/L (ref 0–100)
TCO2: 25.5 mmol/L (ref 0–100)
TCO2: 27 mmol/L (ref 0–100)
pCO2 arterial: 34.9 mmHg — ABNORMAL LOW (ref 35.0–45.0)
pCO2 arterial: 36.2 mmHg (ref 35.0–45.0)
pH, Arterial: 7.411 — ABNORMAL HIGH (ref 7.350–7.400)
pH, Arterial: 7.432 — ABNORMAL HIGH (ref 7.350–7.400)
pH, Arterial: 7.46 — ABNORMAL HIGH (ref 7.350–7.400)

## 2010-11-13 LAB — HOMOCYSTEINE: Homocysteine: 4.7 umol/L (ref 4.0–15.4)

## 2010-11-13 LAB — POCT CARDIAC MARKERS
CKMB, poc: <1 ng/mL — ABNORMAL LOW (ref 1.0–8.0)
Myoglobin, poc: 57.5 ng/mL (ref 12–200)
Myoglobin, poc: 69.9 ng/mL (ref 12–200)

## 2010-11-13 LAB — PHOSPHORUS: Phosphorus: 3.2 mg/dL (ref 2.3–4.6)

## 2010-11-13 LAB — TYPE AND SCREEN: Antibody Screen: NEGATIVE

## 2010-11-13 LAB — LUPUS ANTICOAGULANT PANEL
DRVVT: 60.8 secs — ABNORMAL HIGH (ref 36.2–46.0)
Drvvt confirmation: 1.38 Ratio — ABNORMAL HIGH (ref <1.21)
PTT Lupus Anticoagulant: 43.7 secs — ABNORMAL HIGH (ref 32.0–43.4)
PTT Lupus Anticoagulant: 46.1 secs — ABNORMAL HIGH (ref 32.0–43.4)
PTTLA 4:1 Mix: 43.2 secs (ref 36.3–48.8)

## 2010-11-13 LAB — COMPLEMENT, TOTAL: Compl, Total (CH50): >60 U/mL — ABNORMAL HIGH (ref 31–60)

## 2010-11-13 LAB — ANTITHROMBIN III: AntiThromb III Func: 126 % — ABNORMAL HIGH (ref 76–126)

## 2010-11-13 LAB — IRON AND TIBC
Iron: 19 ug/dL — ABNORMAL LOW (ref 42–135)
UIBC: 326 ug/dL

## 2010-11-13 LAB — RETICULOCYTES: Retic Ct Pct: 2.3 % (ref 0.4–3.1)

## 2010-11-13 LAB — FACTOR 5 LEIDEN

## 2010-11-13 LAB — ABO/RH: ABO/RH(D): A POS

## 2010-11-13 LAB — PREALBUMIN
Prealbumin: 22.6 mg/dL (ref 18.0–45.0)
Prealbumin: 25.9 mg/dL (ref 18.0–45.0)

## 2010-11-13 LAB — PROTHROMBIN GENE MUTATION

## 2010-11-13 LAB — ANA: Anti Nuclear Antibody(ANA): NEGATIVE

## 2010-11-13 LAB — MAGNESIUM
Magnesium: 1.8 mg/dL (ref 1.5–2.5)
Magnesium: 2.1 mg/dL (ref 1.5–2.5)
Magnesium: 2.4 mg/dL (ref 1.5–2.5)

## 2010-11-13 LAB — FOLATE: Folate: 16 ng/mL

## 2010-11-13 LAB — PROTEIN S, TOTAL: Protein S Ag, Total: 129 % (ref 70–140)

## 2010-11-13 LAB — PROTEIN C, TOTAL: Protein C, Total: 132 % (ref 70–140)

## 2010-11-13 LAB — SEDIMENTATION RATE: Sed Rate: 114 mm/hr — ABNORMAL HIGH (ref 0–22)

## 2010-11-13 LAB — PROTEIN S ACTIVITY: Protein S Activity: 51 % — ABNORMAL LOW (ref 69–129)

## 2010-11-16 LAB — DIFFERENTIAL
Band Neutrophils: 1 % (ref 0–10)
Band Neutrophils: 0 % (ref 0–10)
Band Neutrophils: 0 % (ref 0–10)
Basophils Absolute: 0 10*3/uL (ref 0.0–0.1)
Basophils Absolute: 0 10*3/uL (ref 0.0–0.1)
Basophils Absolute: 0 10*3/uL (ref 0.0–0.1)
Basophils Relative: 0 % (ref 0–1)
Basophils Relative: 0 % (ref 0–1)
Blasts: 0 %
Eosinophils Absolute: 0 10*3/uL (ref 0.0–0.7)
Eosinophils Absolute: 0.1 10*3/uL (ref 0.0–0.7)
Eosinophils Relative: 3 % (ref 0–5)
Eosinophils Relative: 4 % (ref 0–5)
Lymphocytes Relative: 38 % (ref 12–46)
Lymphocytes Relative: 40 % (ref 12–46)
Lymphocytes Relative: 43 % (ref 12–46)
Lymphs Abs: 2 10*3/uL (ref 0.7–4.0)
Lymphs Abs: 1.3 10*3/uL (ref 0.7–4.0)
Lymphs Abs: 1.3 10*3/uL (ref 0.7–4.0)
Metamyelocytes Relative: 0 %
Metamyelocytes Relative: 0 %
Monocytes Absolute: 0.2 10*3/uL (ref 0.1–1.0)
Monocytes Relative: 5 % (ref 3–12)
Myelocytes: 1 %
Myelocytes: 0 %
Neutro Abs: 2.6 10*3/uL (ref 1.7–7.7)
Neutro Abs: 2.8 10*3/uL (ref 1.7–7.7)
Neutrophils Relative %: 40 % — ABNORMAL LOW (ref 43–77)
Promyelocytes Absolute: 0 %
nRBC: 0 /100 WBC

## 2010-11-16 LAB — BASIC METABOLIC PANEL
BUN: 5 mg/dL — ABNORMAL LOW (ref 6–23)
BUN: 6 mg/dL (ref 6–23)
CO2: 26 mEq/L (ref 19–32)
CO2: 27 mEq/L (ref 19–32)
Calcium: 8.8 mg/dL (ref 8.4–10.5)
Calcium: 8.9 mg/dL (ref 8.4–10.5)
Chloride: 100 mEq/L (ref 96–112)
Chloride: 101 mEq/L (ref 96–112)
Creatinine, Ser: 0.44 mg/dL (ref 0.4–1.2)
GFR calc non Af Amer: >60 mL/min (ref >60)
Glucose, Bld: 116 mg/dL — ABNORMAL HIGH (ref 70–99)
Glucose, Bld: 98 mg/dL (ref 70–99)
Potassium: 3.6 mEq/L (ref 3.5–5.1)
Potassium: 4.1 mEq/L (ref 3.5–5.1)
Sodium: 133 mEq/L — ABNORMAL LOW (ref 135–145)
Sodium: 135 mEq/L (ref 135–145)

## 2010-11-16 LAB — GLUCOSE, CAPILLARY
Glucose-Capillary: 118 mg/dL — ABNORMAL HIGH (ref 70–99)
Glucose-Capillary: 120 mg/dL — ABNORMAL HIGH (ref 70–99)
Glucose-Capillary: 137 mg/dL — ABNORMAL HIGH (ref 70–99)
Glucose-Capillary: 143 mg/dL — ABNORMAL HIGH (ref 70–99)
Glucose-Capillary: 174 mg/dL — ABNORMAL HIGH (ref 70–99)
Glucose-Capillary: 85 mg/dL (ref 70–99)
Glucose-Capillary: 85 mg/dL (ref 70–99)
Glucose-Capillary: 91 mg/dL (ref 70–99)
Glucose-Capillary: 98 mg/dL (ref 70–99)

## 2010-11-16 LAB — PROTIME-INR
INR: 1.59 — ABNORMAL HIGH (ref 0.00–1.49)
INR: 1.74 — ABNORMAL HIGH (ref 0.00–1.49)
INR: 1.64 — ABNORMAL HIGH (ref 0.00–1.49)
INR: 1.65 — ABNORMAL HIGH (ref 0.00–1.49)
INR: 1.67 — ABNORMAL HIGH (ref 0.00–1.49)
INR: 1.83 — ABNORMAL HIGH (ref 0.00–1.49)
INR: 2.09 — ABNORMAL HIGH (ref 0.00–1.49)
INR: 2.31 — ABNORMAL HIGH (ref 0.00–1.49)
INR: 2.37 — ABNORMAL HIGH (ref 0.00–1.49)
INR: 2.78 — ABNORMAL HIGH (ref 0.00–1.49)
INR: 2.96 — ABNORMAL HIGH (ref 0.00–1.49)
Prothrombin Time: 20.2 seconds — ABNORMAL HIGH (ref 11.6–15.2)
Prothrombin Time: 19.3 seconds — ABNORMAL HIGH (ref 11.6–15.2)
Prothrombin Time: 19.4 seconds — ABNORMAL HIGH (ref 11.6–15.2)
Prothrombin Time: 19.6 seconds — ABNORMAL HIGH (ref 11.6–15.2)
Prothrombin Time: 21 seconds — ABNORMAL HIGH (ref 11.6–15.2)
Prothrombin Time: 25.4 seconds — ABNORMAL HIGH (ref 11.6–15.2)
Prothrombin Time: 28.7 seconds — ABNORMAL HIGH (ref 11.6–15.2)
Prothrombin Time: 30.6 seconds — ABNORMAL HIGH (ref 11.6–15.2)

## 2010-11-16 LAB — COMPREHENSIVE METABOLIC PANEL
ALT: 15 U/L (ref 0–35)
AST: 17 U/L (ref 0–37)
AST: 17 U/L (ref 0–37)
AST: 22 U/L (ref 0–37)
Albumin: 2.7 g/dL — ABNORMAL LOW (ref 3.5–5.2)
Albumin: 3 g/dL — ABNORMAL LOW (ref 3.5–5.2)
Alkaline Phosphatase: 53 U/L (ref 39–117)
Alkaline Phosphatase: 69 U/L (ref 39–117)
BUN: 10 mg/dL (ref 6–23)
BUN: 10 mg/dL (ref 6–23)
BUN: 6 mg/dL (ref 6–23)
CO2: 27 mEq/L (ref 19–32)
CO2: 28 mEq/L (ref 19–32)
Calcium: 8.8 mg/dL (ref 8.4–10.5)
Calcium: 8.9 mg/dL (ref 8.4–10.5)
Chloride: 103 mEq/L (ref 96–112)
Chloride: 101 mEq/L (ref 96–112)
Chloride: 105 mEq/L (ref 96–112)
Creatinine, Ser: 0.49 mg/dL (ref 0.4–1.2)
Creatinine, Ser: 0.47 mg/dL (ref 0.4–1.2)
Creatinine, Ser: 0.52 mg/dL (ref 0.4–1.2)
GFR calc Af Amer: >60 mL/min (ref >60)
GFR calc Af Amer: >60 mL/min (ref >60)
GFR calc non Af Amer: >60 mL/min (ref >60)
GFR calc non Af Amer: >60 mL/min (ref >60)
Glucose, Bld: 85 mg/dL (ref 70–99)
Potassium: 4 mEq/L (ref 3.5–5.1)
Potassium: 4 mEq/L (ref 3.5–5.1)
Total Bilirubin: 0.2 mg/dL — ABNORMAL LOW (ref 0.3–1.2)
Total Bilirubin: 0.2 mg/dL — ABNORMAL LOW (ref 0.3–1.2)
Total Bilirubin: 0.4 mg/dL (ref 0.3–1.2)

## 2010-11-16 LAB — CBC
HCT: 26.1 % — ABNORMAL LOW (ref 36.0–46.0)
HCT: 27.3 % — ABNORMAL LOW (ref 36.0–46.0)
HCT: 27.4 % — ABNORMAL LOW (ref 36.0–46.0)
HCT: 29.3 % — ABNORMAL LOW (ref 36.0–46.0)
HCT: 29.7 % — ABNORMAL LOW (ref 36.0–46.0)
HCT: 29.7 % — ABNORMAL LOW (ref 36.0–46.0)
Hemoglobin: 8.9 g/dL — ABNORMAL LOW (ref 12.0–15.0)
Hemoglobin: 9 g/dL — ABNORMAL LOW (ref 12.0–15.0)
Hemoglobin: 9.4 g/dL — ABNORMAL LOW (ref 12.0–15.0)
Hemoglobin: 9.6 g/dL — ABNORMAL LOW (ref 12.0–15.0)
Hemoglobin: 9.7 g/dL — ABNORMAL LOW (ref 12.0–15.0)
MCHC: 32.5 g/dL (ref 30.0–36.0)
MCHC: 32.2 g/dL (ref 30.0–36.0)
MCHC: 32.7 g/dL (ref 30.0–36.0)
MCHC: 32.8 g/dL (ref 30.0–36.0)
MCHC: 32.9 g/dL (ref 30.0–36.0)
MCV: 77.3 fL — ABNORMAL LOW (ref 78.0–100.0)
MCV: 77.4 fL — ABNORMAL LOW (ref 78.0–100.0)
MCV: 80.2 fL (ref 78.0–100.0)
MCV: 81.3 fL (ref 78.0–100.0)
MCV: 81.9 fL (ref 78.0–100.0)
MCV: 81.9 fL (ref 78.0–100.0)
Platelets: 491 10*3/uL — ABNORMAL HIGH (ref 150–400)
Platelets: 296 10*3/uL (ref 150–400)
Platelets: 298 10*3/uL (ref 150–400)
Platelets: 443 10*3/uL — ABNORMAL HIGH (ref 150–400)
Platelets: 596 10*3/uL — ABNORMAL HIGH (ref 150–400)
RBC: 3.37 MIL/uL — ABNORMAL LOW (ref 3.87–5.11)
RBC: 3.58 MIL/uL — ABNORMAL LOW (ref 3.87–5.11)
RDW: 16.7 % — ABNORMAL HIGH (ref 11.5–15.5)
RDW: 15.4 % (ref 11.5–15.5)
RDW: 15.6 % — ABNORMAL HIGH (ref 11.5–15.5)
WBC: 5.2 10*3/uL (ref 4.0–10.5)
WBC: 3 10*3/uL — ABNORMAL LOW (ref 4.0–10.5)
WBC: 3.7 10*3/uL — ABNORMAL LOW (ref 4.0–10.5)
WBC: 4.3 10*3/uL (ref 4.0–10.5)
WBC: 5.4 10*3/uL (ref 4.0–10.5)

## 2010-11-16 LAB — PHOSPHORUS: Phosphorus: 4.7 mg/dL — ABNORMAL HIGH (ref 2.3–4.6)

## 2010-11-16 LAB — MAGNESIUM
Magnesium: 1.6 mg/dL (ref 1.5–2.5)
Magnesium: 1.7 mg/dL (ref 1.5–2.5)

## 2010-11-20 LAB — GLUCOSE, CAPILLARY
Glucose-Capillary: 100 mg/dL — ABNORMAL HIGH (ref 70–99)
Glucose-Capillary: 105 mg/dL — ABNORMAL HIGH (ref 70–99)
Glucose-Capillary: 106 mg/dL — ABNORMAL HIGH (ref 70–99)
Glucose-Capillary: 106 mg/dL — ABNORMAL HIGH (ref 70–99)
Glucose-Capillary: 113 mg/dL — ABNORMAL HIGH (ref 70–99)
Glucose-Capillary: 122 mg/dL — ABNORMAL HIGH (ref 70–99)
Glucose-Capillary: 124 mg/dL — ABNORMAL HIGH (ref 70–99)
Glucose-Capillary: 125 mg/dL — ABNORMAL HIGH (ref 70–99)
Glucose-Capillary: 132 mg/dL — ABNORMAL HIGH (ref 70–99)
Glucose-Capillary: 134 mg/dL — ABNORMAL HIGH (ref 70–99)
Glucose-Capillary: 137 mg/dL — ABNORMAL HIGH (ref 70–99)
Glucose-Capillary: 137 mg/dL — ABNORMAL HIGH (ref 70–99)
Glucose-Capillary: 139 mg/dL — ABNORMAL HIGH (ref 70–99)
Glucose-Capillary: 142 mg/dL — ABNORMAL HIGH (ref 70–99)
Glucose-Capillary: 145 mg/dL — ABNORMAL HIGH (ref 70–99)
Glucose-Capillary: 146 mg/dL — ABNORMAL HIGH (ref 70–99)
Glucose-Capillary: 147 mg/dL — ABNORMAL HIGH (ref 70–99)
Glucose-Capillary: 149 mg/dL — ABNORMAL HIGH (ref 70–99)
Glucose-Capillary: 152 mg/dL — ABNORMAL HIGH (ref 70–99)
Glucose-Capillary: 153 mg/dL — ABNORMAL HIGH (ref 70–99)
Glucose-Capillary: 154 mg/dL — ABNORMAL HIGH (ref 70–99)
Glucose-Capillary: 159 mg/dL — ABNORMAL HIGH (ref 70–99)
Glucose-Capillary: 179 mg/dL — ABNORMAL HIGH (ref 70–99)
Glucose-Capillary: 180 mg/dL — ABNORMAL HIGH (ref 70–99)
Glucose-Capillary: 180 mg/dL — ABNORMAL HIGH (ref 70–99)
Glucose-Capillary: 184 mg/dL — ABNORMAL HIGH (ref 70–99)
Glucose-Capillary: 186 mg/dL — ABNORMAL HIGH (ref 70–99)
Glucose-Capillary: 201 mg/dL — ABNORMAL HIGH (ref 70–99)
Glucose-Capillary: 202 mg/dL — ABNORMAL HIGH (ref 70–99)
Glucose-Capillary: 71 mg/dL (ref 70–99)
Glucose-Capillary: 77 mg/dL (ref 70–99)
Glucose-Capillary: 78 mg/dL (ref 70–99)
Glucose-Capillary: 78 mg/dL (ref 70–99)
Glucose-Capillary: 78 mg/dL (ref 70–99)
Glucose-Capillary: 81 mg/dL (ref 70–99)
Glucose-Capillary: 86 mg/dL (ref 70–99)
Glucose-Capillary: 88 mg/dL (ref 70–99)
Glucose-Capillary: 89 mg/dL (ref 70–99)
Glucose-Capillary: 90 mg/dL (ref 70–99)
Glucose-Capillary: 91 mg/dL (ref 70–99)
Glucose-Capillary: 93 mg/dL (ref 70–99)
Glucose-Capillary: 94 mg/dL (ref 70–99)
Glucose-Capillary: 94 mg/dL (ref 70–99)
Glucose-Capillary: 97 mg/dL (ref 70–99)
Glucose-Capillary: 99 mg/dL (ref 70–99)

## 2010-11-20 LAB — PROTIME-INR
INR: 1.61 — ABNORMAL HIGH (ref 0.00–1.49)
INR: 1.72 — ABNORMAL HIGH (ref 0.00–1.49)
INR: 2.17 — ABNORMAL HIGH (ref 0.00–1.49)
INR: 2.26 — ABNORMAL HIGH (ref 0.00–1.49)
INR: 2.39 — ABNORMAL HIGH (ref 0.00–1.49)
INR: 2.49 — ABNORMAL HIGH (ref 0.00–1.49)
INR: 2.54 — ABNORMAL HIGH (ref 0.00–1.49)
Prothrombin Time: 18.2 seconds — ABNORMAL HIGH (ref 11.6–15.2)
Prothrombin Time: 18.9 seconds — ABNORMAL HIGH (ref 11.6–15.2)
Prothrombin Time: 19 seconds — ABNORMAL HIGH (ref 11.6–15.2)
Prothrombin Time: 21.3 seconds — ABNORMAL HIGH (ref 11.6–15.2)
Prothrombin Time: 24 seconds — ABNORMAL HIGH (ref 11.6–15.2)
Prothrombin Time: 26.7 seconds — ABNORMAL HIGH (ref 11.6–15.2)
Prothrombin Time: 27.1 seconds — ABNORMAL HIGH (ref 11.6–15.2)
Prothrombin Time: 28.9 seconds — ABNORMAL HIGH (ref 11.6–15.2)

## 2010-11-20 LAB — LIPID PANEL
LDL Cholesterol: 74 mg/dL (ref 0–99)
Total CHOL/HDL Ratio: 2.5 RATIO
VLDL: 7 mg/dL (ref 0–40)

## 2010-11-20 LAB — CBC
MCHC: 32.1 g/dL (ref 30.0–36.0)
RDW: 16.7 % — ABNORMAL HIGH (ref 11.5–15.5)

## 2011-01-24 ENCOUNTER — Encounter
Payer: BC Managed Care – PPO | Attending: Physical Medicine & Rehabilitation | Admitting: Physical Medicine & Rehabilitation

## 2011-01-24 DIAGNOSIS — M7520 Bicipital tendinitis, unspecified shoulder: Secondary | ICD-10-CM

## 2011-01-24 DIAGNOSIS — M719 Bursopathy, unspecified: Secondary | ICD-10-CM | POA: Insufficient documentation

## 2011-01-24 DIAGNOSIS — G8110 Spastic hemiplegia affecting unspecified side: Secondary | ICD-10-CM

## 2011-01-24 DIAGNOSIS — G589 Mononeuropathy, unspecified: Secondary | ICD-10-CM | POA: Insufficient documentation

## 2011-01-24 DIAGNOSIS — M7530 Calcific tendinitis of unspecified shoulder: Secondary | ICD-10-CM

## 2011-01-24 DIAGNOSIS — I6330 Cerebral infarction due to thrombosis of unspecified cerebral artery: Secondary | ICD-10-CM

## 2011-01-24 DIAGNOSIS — M67919 Unspecified disorder of synovium and tendon, unspecified shoulder: Secondary | ICD-10-CM | POA: Insufficient documentation

## 2011-01-24 DIAGNOSIS — I69959 Hemiplegia and hemiparesis following unspecified cerebrovascular disease affecting unspecified side: Secondary | ICD-10-CM | POA: Insufficient documentation

## 2011-01-25 ENCOUNTER — Emergency Department (HOSPITAL_COMMUNITY): Payer: BC Managed Care – PPO

## 2011-01-25 ENCOUNTER — Emergency Department (HOSPITAL_COMMUNITY)
Admission: EM | Admit: 2011-01-25 | Discharge: 2011-01-25 | Disposition: A | Discharge status: Home / Self Care | Payer: BC Managed Care – PPO | Attending: Emergency Medicine | Admitting: Emergency Medicine

## 2011-01-25 DIAGNOSIS — E039 Hypothyroidism, unspecified: Secondary | ICD-10-CM | POA: Insufficient documentation

## 2011-01-25 DIAGNOSIS — R51 Headache: Secondary | ICD-10-CM | POA: Insufficient documentation

## 2011-01-25 DIAGNOSIS — Z7901 Long term (current) use of anticoagulants: Secondary | ICD-10-CM | POA: Insufficient documentation

## 2011-01-25 DIAGNOSIS — Z8673 Personal history of transient ischemic attack (TIA), and cerebral infarction without residual deficits: Secondary | ICD-10-CM | POA: Insufficient documentation

## 2011-01-25 DIAGNOSIS — Z794 Long term (current) use of insulin: Secondary | ICD-10-CM | POA: Insufficient documentation

## 2011-01-25 DIAGNOSIS — G40909 Epilepsy, unspecified, not intractable, without status epilepticus: Secondary | ICD-10-CM | POA: Insufficient documentation

## 2011-01-25 DIAGNOSIS — E119 Type 2 diabetes mellitus without complications: Secondary | ICD-10-CM | POA: Insufficient documentation

## 2011-01-25 DIAGNOSIS — I10 Essential (primary) hypertension: Secondary | ICD-10-CM | POA: Insufficient documentation

## 2011-01-25 DIAGNOSIS — Z79899 Other long term (current) drug therapy: Secondary | ICD-10-CM | POA: Insufficient documentation

## 2011-01-25 DIAGNOSIS — W503XXA Accidental bite by another person, initial encounter: Secondary | ICD-10-CM | POA: Insufficient documentation

## 2011-01-25 DIAGNOSIS — IMO0002 Reserved for concepts with insufficient information to code with codable children: Secondary | ICD-10-CM | POA: Insufficient documentation

## 2011-01-25 LAB — PROTIME-INR
INR: 2.73 — ABNORMAL HIGH (ref 0.00–1.49)
Prothrombin Time: 29 seconds — ABNORMAL HIGH (ref 11.6–15.2)

## 2011-01-25 LAB — BASIC METABOLIC PANEL
BUN: 10 mg/dL (ref 6–23)
Calcium: 9.2 mg/dL (ref 8.4–10.5)
GFR calc non Af Amer: >60 mL/min (ref >60)
Glucose, Bld: 216 mg/dL — ABNORMAL HIGH (ref 70–99)

## 2011-01-25 LAB — URINALYSIS, ROUTINE W REFLEX MICROSCOPIC
Bilirubin Urine: NEGATIVE
Ketones, ur: NEGATIVE mg/dL
Nitrite: NEGATIVE
Protein, ur: 100 mg/dL — AB
Urobilinogen, UA: 1 mg/dL (ref 0.0–1.0)

## 2011-01-25 LAB — CBC
MCH: 30.2 pg (ref 26.0–34.0)
MCHC: 35.1 g/dL (ref 30.0–36.0)
Platelets: 255 10*3/uL (ref 150–400)
RBC: 4.67 MIL/uL (ref 3.87–5.11)

## 2011-01-25 NOTE — Assessment & Plan Note (Signed)
Kimberly [name] is back regarding her right MCA stroke.  I have not seen her since December.  I am really not sure why she did not follow up sooner. She is having some bilateral shoulder pain, left more than right.  The right bothers her with activity, left bothers her more when she rests on that side or if the arm is hanging downwards.  She was given a sling by an orthopedist in [place].  She is having some left hip pain and left foot pain.  She is concerned why the left foot will not stretch out in all the way.  We had her on baclofen previously, but apparently this was taken off to due to a questionable seizure.  She has been given Ultram in the past for breakthrough pain.  Sleep is good.  She tries to do some short distance walking with her cane.  Primarily, she is in a wheelchair lot of the day.  REVIEW OF SYSTEMS:  Notable for the above.  Full 12-point review is in the written health and history section of the chart.  SOCIAL HISTORY:  The patient is married.  Husband is present with her today.  PHYSICAL EXAMINATION:  VITAL SIGNS:  Blood pressure is 133/66, pulse 78, respiratory rate 18, she is satting 99% on room air. GENERAL:  The patient is generally flat.  Sometimes, she is inappropriate with her gestures and has been impulsive with speech. MUSCULOSKELETAL:  Left shoulder is painful with rotation movements and rotator cuff impingement maneuvers.  She has 1/2-inch subluxation of the left shoulder as well.  Right shoulder is more tender anteriorly along the short head biceps tendon and lesser so with rotator cuff impingement maneuvers.  She has trace movement in left upper extremity.  Left lower extremity, she is 2 to 2+ out of 5 except for the left ankle, but she is 1+ with plantar flexion, 0 with ankle dorsiflexion.  Left foot is in a 15-20 degrees plantar flexion contracture.  There is some surrounding spasticity as well.  She remained hyperreflexic on the left side throughout.  She  has mild inattention and sensory loss still on the left side. HEART:  Regular. CHEST:  Clear. ABDOMEN:  Soft, nontender.  ASSESSMENT: 1. Right middle cerebral artery stroke, spastic left hemiparesis, and     inattention. 2. Neuropathic left-sided pain. 3. Left rotator cuff syndrome and stroke syndrome related to     subluxation of the shoulder. 4. Right bicipital tendonitis.  PLAN: 1. After informed consent, we injected around the short head biceps     tendon with 40 mg Kenalog 1 mL of 1% lidocaine.  I also injected     the left shoulder via lateral approach with the same solution.  I     have asked patient to work on regular passive and active range of     motion as tolerated.  She should avoid over use.  She also needs to     be better about keeping the left arm supportive when she is up and     about.  Sling is okay for short periods of time.  I want to     minimize the use of this to avoid adhesive capsulitis. 2. We will set patient up for Botox injections as she is not a     candidate for oral medications with seizure.  We will try using the     left gastrocs perhaps the tibialis posterior.  We went over some  stretching exercises as well today.  I likely have her followup     with therapy once we do the injections for a program of stretching     and range of     motion. 3. I will see the patient back here pending injection the above.     Ranelle Oyster, M.D. Electronically Signed    ZTS/MedQ D:  01/24/2011 11:29:52  T:  01/25/2011 00:12:56  Job #:  [id number]  cc:   Truddie Crumble Fax: [phone]

## 2011-03-27 ENCOUNTER — Encounter
Payer: BC Managed Care – PPO | Attending: Physical Medicine & Rehabilitation | Admitting: Physical Medicine & Rehabilitation

## 2011-03-27 DIAGNOSIS — G8110 Spastic hemiplegia affecting unspecified side: Secondary | ICD-10-CM

## 2011-03-27 NOTE — Procedures (Signed)
NAMECRYSTALMARIE, Kimberly [name]                ACCOUNT NO.:  [id number]  MEDICAL RECORD NO.:  [id number]           PATIENT TYPE:  O  LOCATION:  PRM                          FACILITY:  [facility]  PHYSICIAN:  Ranelle Oyster, M.D.DATE OF BIRTH:  Apr 03, 1961  DATE OF PROCEDURE:  03/27/2011 DATE OF DISCHARGE:                              OPERATIVE REPORT  PROCEDURE:  Botox injection.  DIAGNOSTIC CODE:  This 342.12 spastic hemiparesis nondominant limb.  DESCRIPTION OF PROCEDURE:  After informed consent, preparation of the skin with isopropyl alcohol, I localized the gastrocnemius muscles as well as tibialis posterior.  I injected 75 units of Botox into each gastroc as well as the tibialis posterior.  I diluted each 100 units of Botox in 1 mL preservative-free normal saline.  The patient tolerated well.  We discussed stretching program at the end of injection.  Areas did not bleed and the skin was cleaned at appropriate time of discharge.     Ranelle Oyster, M.D. Electronically Signed    ZTS/MEDQ  D:  03/27/2011 13:04:54  T:  03/27/2011 22:29:11  Job:  [id number]

## 2011-06-13 ENCOUNTER — Encounter
Payer: BC Managed Care – PPO | Attending: Physical Medicine & Rehabilitation | Admitting: Physical Medicine & Rehabilitation

## 2011-07-16 ENCOUNTER — Emergency Department (INDEPENDENT_AMBULATORY_CARE_PROVIDER_SITE_OTHER): Payer: BC Managed Care – PPO

## 2011-07-16 ENCOUNTER — Emergency Department (HOSPITAL_BASED_OUTPATIENT_CLINIC_OR_DEPARTMENT_OTHER)
Admission: EM | Admit: 2011-07-16 | Discharge: 2011-07-16 | Disposition: A | Discharge status: Home / Self Care | Payer: BC Managed Care – PPO | Attending: Emergency Medicine | Admitting: Emergency Medicine

## 2011-07-16 ENCOUNTER — Encounter: Payer: Self-pay | Admitting: *Deleted

## 2011-07-16 DIAGNOSIS — E119 Type 2 diabetes mellitus without complications: Secondary | ICD-10-CM | POA: Insufficient documentation

## 2011-07-16 DIAGNOSIS — G9389 Other specified disorders of brain: Secondary | ICD-10-CM | POA: Insufficient documentation

## 2011-07-16 DIAGNOSIS — R569 Unspecified convulsions: Secondary | ICD-10-CM

## 2011-07-16 DIAGNOSIS — Z8679 Personal history of other diseases of the circulatory system: Secondary | ICD-10-CM | POA: Insufficient documentation

## 2011-07-16 DIAGNOSIS — Z79899 Other long term (current) drug therapy: Secondary | ICD-10-CM | POA: Insufficient documentation

## 2011-07-16 DIAGNOSIS — I10 Essential (primary) hypertension: Secondary | ICD-10-CM | POA: Insufficient documentation

## 2011-07-16 DIAGNOSIS — Z8673 Personal history of transient ischemic attack (TIA), and cerebral infarction without residual deficits: Secondary | ICD-10-CM

## 2011-07-16 HISTORY — DX: Essential (primary) hypertension: I10

## 2011-07-16 HISTORY — DX: Cerebral infarction, unspecified: I63.9

## 2011-07-16 HISTORY — DX: Unspecified convulsions: R56.9

## 2011-07-16 LAB — PROTIME-INR
INR: 1.63 — ABNORMAL HIGH (ref 0.00–1.49)
Prothrombin Time: 19.6 seconds — ABNORMAL HIGH (ref 11.6–15.2)

## 2011-07-16 NOTE — ED Notes (Signed)
EMS reports patient was home and had a seizure that lasted approximately 5-7 minutes.  CBG 119, BP 160/100, now 148/96.  Patient has a history of seizures and stroke with left side paralysis.

## 2011-07-16 NOTE — ED Provider Notes (Signed)
History     CSN: [id number] Arrival date & time: 07/16/2011 12:16 PM   First MD Initiated Contact with Patient 07/16/11 1227      Chief Complaint  Patient presents with  . Seizures    (Consider location/radiation/quality/duration/timing/severity/associated sxs/prior treatment) Patient is a 50 y.o. female presenting with seizures. The history is provided by the patient.  Seizures  Pertinent negatives include no confusion, no headaches and no chest pain.  pt w hx cva w left hemiparesis, and seizures presents after sz from home. ?duration 5 minutes. Pt states was at home w care provider. Felt fine this morning/earlier today. States compliant w meds, denies recent change in meds. Pt unsure when last seizure was, but denies recent increase in seizure frequency. No headache. No neck or back pain. No new numbness/weakness. States recent health at baseline. No fever or chills. No nvd. No gu c/o.   Past Medical History  Diagnosis Date  . Seizures   . Stroke   . Diabetes mellitus   . Hypertension     History reviewed. No pertinent past surgical history.  No family history on file.  History  Substance Use Topics  . Smoking status: Not on file  . Smokeless tobacco: Not on file  . Alcohol Use:     OB History    Grav Para Term Preterm Abortions TAB SAB Ect Mult Living                  Review of Systems  Constitutional: Negative for fever and chills.  HENT: Negative for neck pain.   Eyes: Negative for redness.  Respiratory: Negative for shortness of breath.   Cardiovascular: Negative for chest pain and palpitations.  Gastrointestinal: Negative for abdominal pain.  Genitourinary: Negative for dysuria.  Musculoskeletal: Negative for back pain.  Skin: Negative for rash.  Neurological: Positive for seizures. Negative for headaches.  Hematological: Does not bruise/bleed easily.  Psychiatric/Behavioral: Negative for confusion.    Allergies  Review of patient's allergies  indicates no known allergies.  Home Medications   Current Outpatient Rx  Name Route Sig Dispense Refill  . INTEGRA 62.5-62.5-40-3 MG PO CAPS Oral Take by mouth.      Marland Kitchen GABAPENTIN 100 MG PO CAPS Oral Take 100 mg by mouth 3 (three) times daily.      Marland Kitchen LACOSAMIDE 200 MG PO TABS Oral Take by mouth 2 (two) times daily.      Marland Kitchen LEVETIRACETAM 100 MG/ML PO SOLN Oral Take 10 mg/kg by mouth 2 (two) times daily.      Marland Kitchen METOPROLOL SUCCINATE 25 MG PO TB24 Oral Take 25 mg by mouth daily.      Marland Kitchen PANTOPRAZOLE SODIUM 20 MG PO TBEC Oral Take 20 mg by mouth daily.      . WARFARIN SODIUM 10 MG PO TABS Oral Take 10 mg by mouth daily.        BP 152/104  Pulse 104  Temp 98.7 F (37.1 C)  Resp 20  SpO2 98%  Physical Exam  Nursing note and vitals reviewed. Constitutional: She is oriented to person, place, and time. She appears well-developed and well-nourished. No distress.  HENT:  Head: Atraumatic.  Mouth/Throat: Oropharynx is clear and moist.  Eyes: Conjunctivae are normal. Pupils are equal, round, and reactive to light. No scleral icterus.  Neck: Neck supple. No tracheal deviation present.  Cardiovascular: Normal rate, regular rhythm, normal heart sounds and intact distal pulses.  Exam reveals no friction rub.   No murmur heard. Pulmonary/Chest: Effort  normal. No respiratory distress. She has no rales. She exhibits no tenderness.  Abdominal: Soft. Normal appearance. She exhibits no distension and no mass. There is no tenderness. There is no rebound and no guarding.  Genitourinary:       No cva tenderness.   Musculoskeletal: She exhibits no edema and no tenderness.       Good rom without pain or focal bony tenderness. Entire spine non tender  Neurological: She is alert and oriented to person, place, and time.       Left weakness, at baseline per pt.   Skin: Skin is warm and dry. No rash noted.  Psychiatric: She has a normal mood and affect.    ED Course  Procedures (including critical care  time)   Labs Reviewed  PROTIME-INR   No results found.  Results for orders placed during the hospital encounter of 07/16/11  PROTIME-INR      Component Value Range   Prothrombin Time 19.6 (*) 11.6 - 15.2 (seconds)   INR 1.63 (*) 0.00 - 1.49    Ct Head Wo Contrast  07/16/2011  *RADIOLOGY REPORT*  Clinical Data: Seizure.  History of CVA.  CT HEAD WITHOUT CONTRAST  Technique:  Contiguous axial images were obtained from the base of the skull through the vertex without contrast.  Comparison: Head CT 01/25/2011 and 10/09/2010.  Findings: Extensive encephalomalacia throughout the right frontal and temporal lobes appears stable.  There is stable ipsilateral ventricular dilatation with midline shift to the right.  There is no evidence of acute intracranial hemorrhage, mass lesion, brain edema or extra-axial fluid collection.  There is no hydrocephalus.  The visualized paranasal sinuses are clear.  The calvarium is intact.  Calvarial hyperostosis is noted.  IMPRESSION: Stable examination with right MCA distribution encephalomalacia. No acute intracranial findings.  Original Report Authenticated By: Gerrianne Scale, M.D.     No diagnosis found.    MDM  Nursing notes and prior labs/ct, and echart notes reviewed.   Recheck alert, awake, talking on cell phone. No c/o. No pain. Spine nt.       Suzi Roots, MD 07/16/11 1336

## 2012-01-23 ENCOUNTER — Emergency Department (HOSPITAL_BASED_OUTPATIENT_CLINIC_OR_DEPARTMENT_OTHER)
Admission: EM | Admit: 2012-01-23 | Discharge: 2012-01-23 | Disposition: A | Discharge status: Home / Self Care | Payer: BC Managed Care – PPO | Attending: Emergency Medicine | Admitting: Emergency Medicine

## 2012-01-23 ENCOUNTER — Emergency Department (HOSPITAL_BASED_OUTPATIENT_CLINIC_OR_DEPARTMENT_OTHER): Payer: BC Managed Care – PPO

## 2012-01-23 ENCOUNTER — Encounter (HOSPITAL_BASED_OUTPATIENT_CLINIC_OR_DEPARTMENT_OTHER): Payer: Self-pay

## 2012-01-23 DIAGNOSIS — Z8679 Personal history of other diseases of the circulatory system: Secondary | ICD-10-CM | POA: Insufficient documentation

## 2012-01-23 DIAGNOSIS — R569 Unspecified convulsions: Secondary | ICD-10-CM | POA: Insufficient documentation

## 2012-01-23 DIAGNOSIS — S43036A Inferior dislocation of unspecified humerus, initial encounter: Secondary | ICD-10-CM | POA: Insufficient documentation

## 2012-01-23 DIAGNOSIS — Z794 Long term (current) use of insulin: Secondary | ICD-10-CM | POA: Insufficient documentation

## 2012-01-23 DIAGNOSIS — E119 Type 2 diabetes mellitus without complications: Secondary | ICD-10-CM | POA: Insufficient documentation

## 2012-01-23 DIAGNOSIS — S43002A Unspecified subluxation of left shoulder joint, initial encounter: Secondary | ICD-10-CM

## 2012-01-23 DIAGNOSIS — M25519 Pain in unspecified shoulder: Secondary | ICD-10-CM | POA: Insufficient documentation

## 2012-01-23 DIAGNOSIS — S43003A Unspecified subluxation of unspecified shoulder joint, initial encounter: Secondary | ICD-10-CM

## 2012-01-23 DIAGNOSIS — W050XXA Fall from non-moving wheelchair, initial encounter: Secondary | ICD-10-CM | POA: Insufficient documentation

## 2012-01-23 DIAGNOSIS — I10 Essential (primary) hypertension: Secondary | ICD-10-CM | POA: Insufficient documentation

## 2012-01-23 MED ORDER — TRAMADOL HCL 50 MG PO TABS: 50.0000 mg | ORAL_TABLET | Freq: Four times a day (QID) | ORAL | Status: AC | PRN

## 2012-01-23 MED ORDER — METHOCARBAMOL 500 MG PO TABS: 500.0000 mg | ORAL_TABLET | Freq: Two times a day (BID) | ORAL | Status: AC

## 2012-01-23 NOTE — ED Provider Notes (Signed)
History     CSN: [id number]  Arrival date & time 01/23/12  1432   First MD Initiated Contact with Patient 01/23/12 1510     4:00 PM HPI Patient reports on April 1 she fell out of her wheelchair. Patient is wheelchair-bound after stroke left her left side paralyzed. Reports when she fell out of her wheelchair she fell onto her left shoulder and the arm rest hit her left flank. Reports she continues to have pain despite rest and physical therapy. Reports she is in physical therapy for her stroke related left sided weakness and paralysis.  Patient is a 51 y.o. female presenting with fall. The history is provided by the patient.  Fall Incident onset: Approximately 2 months ago. She landed on concrete. There was no blood loss. The point of impact was the left shoulder. The pain is present in the left shoulder. The pain is moderate. There was no alcohol use involved in the accident. Pertinent negatives include no visual change, no numbness, no abdominal pain, no nausea, no vomiting, no headaches and no loss of consciousness. The symptoms are aggravated by activity. She has tried nothing for the symptoms.    Past Medical History  Diagnosis Date  . Seizures   . Stroke   . Diabetes mellitus   . Hypertension     History reviewed. No pertinent past surgical history.  No family history on file.  History  Substance Use Topics  . Smoking status: Never Smoker   . Smokeless tobacco: Not on file  . Alcohol Use: No    OB History    Grav Para Term Preterm Abortions TAB SAB Ect Mult Living                  Review of Systems  Constitutional: Negative for chills and unexpected weight change.  HENT: Negative for neck pain.   Respiratory: Negative for cough and shortness of breath.   Cardiovascular: Negative for chest pain.  Gastrointestinal: Negative for nausea, vomiting and abdominal pain.  Musculoskeletal: Negative for back pain.       Shoulder pain and rib pain  Neurological: Negative  for loss of consciousness, weakness, numbness and headaches.  All other systems reviewed and are negative.    Allergies  Review of patient's allergies indicates no known allergies.  Home Medications   Current Outpatient Rx  Name Route Sig Dispense Refill  . VITAMIN D 1000 UNITS PO TABS Oral Take 1,000 Units by mouth daily.    Marland Kitchen COLACE PO Oral Take 1 capsule by mouth daily as needed. Patient used this medication for constipation.    . INTEGRA 62.5-62.5-40-3 MG PO CAPS Oral Take by mouth.      Marland Kitchen GABAPENTIN 100 MG PO CAPS Oral Take 100 mg by mouth 3 (three) times daily.     Marland Kitchen GLIMEPIRIDE 1 MG PO TABS Oral Take 1 mg by mouth daily before breakfast.    . HYDROCORTISONE 2.5 % RE CREA Rectal Place 1 application rectally 2 (two) times daily. Patient used this for constipation.    . INSULIN GLARGINE 100 UNIT/ML [place] SOLN Subcutaneous Inject 46 Units into the skin at bedtime.     Marland Kitchen LACOSAMIDE 200 MG PO TABS Oral Take by mouth 2 (two) times daily.      Marland Kitchen METOPROLOL SUCCINATE ER 25 MG PO TB24 Oral Take 25 mg by mouth daily.      Marland Kitchen PANTOPRAZOLE SODIUM 20 MG PO TBEC Oral Take 20 mg by mouth daily.      Marland Kitchen  SIMVASTATIN 20 MG PO TABS Oral Take 20 mg by mouth every evening.    . WARFARIN SODIUM 10 MG PO TABS Oral Take 10 mg by mouth daily.        BP 142/84  Pulse 72  Temp(Src) 98.2 F (36.8 C) (Oral)  Resp 16  Ht 5\' 3"  (1.6 m)  Wt 250 lb (113.399 kg)  BMI 44.29 kg/m2  SpO2 98%  Physical Exam  Vitals reviewed. Constitutional: She is oriented to person, place, and time. Vital signs are normal. She appears well-developed and well-nourished.  HENT:  Head: Normocephalic and atraumatic.  Eyes: Conjunctivae are normal. Pupils are equal, round, and reactive to light.  Neck: Normal range of motion. Neck supple.  Cardiovascular: Normal rate, regular rhythm and normal heart sounds.  Exam reveals no friction rub.   No murmur heard. Pulmonary/Chest: Effort normal and breath sounds normal. She has no  wheezes. She has no rhonchi. She has no rales. She exhibits no tenderness.  Musculoskeletal: Normal range of motion.       Left shoulder: She exhibits no tenderness, no bony tenderness, no swelling, no effusion, no crepitus, no deformity, no laceration and normal pulse.       Patient reports pain to left superior shoulder. States it is tender when I palpate this area. Patient has full passive range of motion however of patient's left arm is paralyzed as soap unable to actively move arm  4 test strength. Normal distal pulses. No step off or abnormal deformity on exam. Patient also complaining of left rib pain nontender to palpation. No bruising of ribs. Normal lung exam   Neurological: She is alert and oriented to person, place, and time. Coordination normal.  Skin: Skin is warm and dry. No rash noted. No erythema. No pallor.    ED Course  Procedures  Results for orders placed during the hospital encounter of 07/16/11  PROTIME-INR      Component Value Range   Prothrombin Time 19.6 (*) 11.6 - 15.2 (seconds)   INR 1.63 (*) 0.00 - 1.49    Dg Ribs Unilateral W/chest Left  01/23/2012  *RADIOLOGY REPORT*  Clinical Data: Larey Seat today with left-sided chest pain and shortness of breath  LEFT RIBS AND CHEST - 3+ VIEW  Comparison: Chest x-ray of 05/24/2010  Findings: The lungs are not optimally aerated.  No infiltrate is seen and no pneumothorax is noted.  There is no evidence of pleural effusion.  The left humeral head is noted to be somewhat subluxed in position but no definite fracture is seen.  The left ribs appear intact.  IMPRESSION:  1.  No active lung disease.  No pneumothorax. 2.  The left humeral head is inferiorly positioned but does not appear to be completely dislocated and may be subluxed.  Joint effusion cannot be excluded.  Original Report Authenticated By: Juline Patch, M.D.   Dg Shoulder Left  01/23/2012  *RADIOLOGY REPORT*  Clinical Data: Larey Seat today with limited movement of the left  shoulder and pain  LEFT SHOULDER - 2+ VIEW  Comparison: None.  Findings: The left humeral head is inferiorly positioned but does not appear to be completely dislocated .  There is significantly subluxed and this raises the possibility of left joint effusion. The left [facility] joint is normally aligned.  IMPRESSION: Significantly subluxed left humeral head without definite dislocation.  Question joint effusion.  Original Report Authenticated By: Juline Patch, M.D.     MDM    Patient has a mild subluxation  of the left shoulder. Will discharge with Ultram and Robaxin. Advised followup with orthopedic physician to discuss possible physical therapy. Patient and spouse voiced understanding and are ready for discharge      Thomasene Lot, Cordelia Poche 01/23/12 1739

## 2012-01-23 NOTE — ED Notes (Signed)
Fell April 1st-c/o cont'd pain to left shoulder-no medical f/u

## 2012-01-23 NOTE — ED Provider Notes (Signed)
History/physical exam/procedure(s) were performed by non-physician practitioner and as supervising physician I was immediately available for consultation/collaboration. I have reviewed all notes and am in agreement with care and plan.   Lindon Kiel S Jestine Bicknell, MD 01/23/12 1746 

## 2012-01-23 NOTE — Discharge Instructions (Signed)
Subluxation A subluxation is a minor form of a dislocation, in which two or more adjacent bones are no longer properly aligned. The most common joints susceptible to a dislocation are the shoulder, kneecap, and fingers.  SYMPTOMS   Sudden pain at the time of injury.   Noticeable deformity in the area of the joint.   Limited range of motion.  CAUSES   Usually a traumatic injury that stretches or tears ligaments that surround a joint and hold the bones together.   Condition present at birth (congenital) in which the joint surfaces are shallow or abnormally formed.   Joint disease such as arthritis or other diseases of ligaments and tissues around a joint.  RISK INCREASES WITH:  Repeated injury to a joint.   Previous dislocation of a joint.   Contact sports (football, rugby, hockey, lacrosse) or sports that require repetitive overhead arm motion (throwing, swimming, volleyball).   Rheumatoid arthritis.   Congenital joint condition.  PREVENTION  Warm up and stretch properly before activity.   Maintain physical fitness:   Joint flexibility.   Muscle strength and endurance.   Cardiovascular fitness.   Wear proper protective equipment and ensure correct fit.   Learn and use proper technique.  PROGNOSIS  This condition is usually curable with prompt treatment. You may need physical therapy for strength and stretching exercises that may be performed at home or with a therapist. RELATED COMPLICATIONS   Damage to nearby nerves or major blood vessels, causing numbness, coldness, or paleness.   Recurrent injury to the joint.   Arthritis of affected joint.   Fracture of joint.  TREATMENT Treatment initially involves realigning the bones (reduction) of the joint. Reductions should only be performed by someone who is trained in the procedure. After the joint is reduced, medicine and ice should be used to reduce pain and inflammation. The joint may be immobilized to allow for the  muscles and ligaments to heal. If a joint is subjected to recurrent dislocations, surgery may be necessary to tighten or replace injured structures. After surgery, stretching and strengthening exercises may be required. These may be performed at home or with a therapist. MEDICATION   Patients may require medicine to help them relax (sedative) or muscle relaxants in order to reduce the joint.   If pain medicine is necessary, nonsteroidal anti-inflammatory medicines, such as aspirin and ibuprofen, or other minor pain relievers, such as acetaminophen, are often recommended.   Do not take pain medicine for 7 days before surgery.   Prescription pain relievers may be necessary. Use only as directed and only as much as you need.  SEEK MEDICAL CARE IF:   Symptoms get worse or do not improve despite treatment.   You have difficulty moving a joint after injury.   Any extremity becomes numb, pale, or cool after injury. This is an emergency.   Dislocations or subluxations occur repeatedly.  Document Released: 08/13/2005 Document Revised: 08/02/2011 Document Reviewed: 11/25/2008 [facility] Patient Information 2012 [place], [place].

## 2012-02-05 ENCOUNTER — Encounter: Payer: BC Managed Care – PPO | Admitting: Physical Medicine and Rehabilitation

## 2012-02-06 ENCOUNTER — Encounter: Payer: BC Managed Care – PPO | Admitting: Physical Medicine & Rehabilitation

## 2012-02-11 ENCOUNTER — Encounter
Payer: BC Managed Care – PPO | Attending: Physical Medicine & Rehabilitation | Admitting: Physical Medicine and Rehabilitation

## 2012-02-11 ENCOUNTER — Encounter: Payer: Self-pay | Admitting: Physical Medicine and Rehabilitation

## 2012-02-11 VITALS — BP 128/67 | HR 75 | Resp 16 | Ht 63.0 in | Wt 242.6 lb

## 2012-02-11 DIAGNOSIS — I69959 Hemiplegia and hemiparesis following unspecified cerebrovascular disease affecting unspecified side: Secondary | ICD-10-CM | POA: Insufficient documentation

## 2012-02-11 DIAGNOSIS — M79609 Pain in unspecified limb: Secondary | ICD-10-CM | POA: Insufficient documentation

## 2012-02-11 DIAGNOSIS — I6350 Cerebral infarction due to unspecified occlusion or stenosis of unspecified cerebral artery: Secondary | ICD-10-CM

## 2012-02-11 DIAGNOSIS — I639 Cerebral infarction, unspecified: Secondary | ICD-10-CM

## 2012-02-11 DIAGNOSIS — M6700 Short Achilles tendon (acquired), unspecified ankle: Secondary | ICD-10-CM

## 2012-02-11 DIAGNOSIS — M6240 Contracture of muscle, unspecified site: Secondary | ICD-10-CM | POA: Insufficient documentation

## 2012-02-11 DIAGNOSIS — M6702 Short Achilles tendon (acquired), left ankle: Secondary | ICD-10-CM

## 2012-02-11 NOTE — Progress Notes (Signed)
Subjective:    Patient ID: Kimberly [name], female    DOB: 02-Sep-1960, 51 y.o.   MRN: [id number]  HPI Patient has a history of a CVA in January 2011, which left her with left hemiparesis. She is following up today after a Botox injection  into her left lower leg. She states that the injection has not helped her at all. She is to not able to wear her AFO brace because of the increased tone in the calf muscle, and plantar flex position of her left foot. She also complains about left arm pain.  Pain Inventory Average Pain 5 Pain Right Now 5 My pain is intermittent and dull  In the last 24 hours, has pain interfered with the following? General activity 0 Relation with others 0 Enjoyment of life 0 What TIME of day is your pain at its worst? night Sleep (in general) Good  Pain is worse with: sitting Pain improves with: rest, heat/ice and medication Relief from Meds: 9  Mobility walk with assistance use a cane how many minutes can you walk? 5 ability to climb steps?  yes do you drive?  no use a wheelchair  Function disabled: date disabled  I need assistance with the following:  meal prep and shopping  Neuro/Psych bowel control problems  Prior Studies x-rays at [facility]  Physicians involved in your care Any changes since last visit?  no   Family History  Problem Relation Age of Onset  . Cancer Father    History   Social History  . Marital Status: Married    Spouse Name: N/A    Number of Children: N/A  . Years of Education: N/A   Social History Main Topics  . Smoking status: Never Smoker   . Smokeless tobacco: Never Used  . Alcohol Use: No  . Drug Use: No  . Sexually Active: None   Other Topics Concern  . None   Social History Narrative  . None   History reviewed. No pertinent past surgical history. Past Medical History  Diagnosis Date  . Seizures   . Stroke   . Diabetes mellitus   . Hypertension    BP 128/67  Pulse 75  Resp 16  Ht 5\' 3"   (1.6 m)  Wt 242 lb 9.6 oz (110.043 kg)  BMI 42.97 kg/m2  SpO2 97%    Review of Systems  Constitutional: Positive for diaphoresis and unexpected weight change.  Gastrointestinal: Positive for constipation.       Objective:   Physical Exam  Constitutional: She is oriented to person, place, and time. She appears well-developed and well-nourished.       Morbidly obese in wheelchair  HENT:  Head: Normocephalic.  Neck: Neck supple.  Musculoskeletal: She exhibits tenderness.  Neurological: She is alert and oriented to person, place, and time.  Skin: Skin is warm and dry.  Psychiatric: She has a normal mood and affect.   Symmetric normal motor tone is noted throughout, except increased muscle tone in left UE and more severe in left LE. Normal muscle bulk. Muscle testing reveals 5/5 muscle strength of the upper extremity, and 5/5 of the lower extremity, on the right, on the left UE 0-1/5, LE contracture of gastroc/achilles tendon.  Fine motor movements are normal in the right hand, not possible in the left. Sensory is decreased on the left side.         Assessment & Plan:  This is a 51 year old female  with 1. CVA 2. Contracture  of left achilles tendon 3. Leg pain in the left 4. Left arm pain Plan : Continue with medication, continue with exercising and stretching at home. Also prescribed formal physical therapy for the left achilles tendon contracture and the left shoulder pain. Showed daughter some stretches she should do at least twice a day with her mother. Follow up in 1 month.

## 2012-02-11 NOTE — Patient Instructions (Signed)
Continue with your exercises, continue with medication. Try PT

## 2012-03-12 ENCOUNTER — Ambulatory Visit
Payer: BC Managed Care – PPO | Attending: Physical Medicine and Rehabilitation | Admitting: Rehabilitative and Restorative Service Providers"

## 2012-03-12 ENCOUNTER — Encounter: Payer: Self-pay | Admitting: Physical Medicine and Rehabilitation

## 2012-03-12 ENCOUNTER — Encounter
Payer: BC Managed Care – PPO | Attending: Physical Medicine & Rehabilitation | Admitting: Physical Medicine and Rehabilitation

## 2012-03-12 VITALS — BP 138/68 | HR 75 | Resp 14 | Ht 63.0 in | Wt 250.0 lb

## 2012-03-12 DIAGNOSIS — R262 Difficulty in walking, not elsewhere classified: Secondary | ICD-10-CM | POA: Insufficient documentation

## 2012-03-12 DIAGNOSIS — I69959 Hemiplegia and hemiparesis following unspecified cerebrovascular disease affecting unspecified side: Secondary | ICD-10-CM | POA: Insufficient documentation

## 2012-03-12 DIAGNOSIS — M79602 Pain in left arm: Secondary | ICD-10-CM

## 2012-03-12 DIAGNOSIS — M79609 Pain in unspecified limb: Secondary | ICD-10-CM | POA: Insufficient documentation

## 2012-03-12 DIAGNOSIS — I6350 Cerebral infarction due to unspecified occlusion or stenosis of unspecified cerebral artery: Secondary | ICD-10-CM

## 2012-03-12 DIAGNOSIS — IMO0001 Reserved for inherently not codable concepts without codable children: Secondary | ICD-10-CM | POA: Insufficient documentation

## 2012-03-12 DIAGNOSIS — M6281 Muscle weakness (generalized): Secondary | ICD-10-CM | POA: Insufficient documentation

## 2012-03-12 DIAGNOSIS — M6240 Contracture of muscle, unspecified site: Secondary | ICD-10-CM | POA: Insufficient documentation

## 2012-03-12 DIAGNOSIS — I69998 Other sequelae following unspecified cerebrovascular disease: Secondary | ICD-10-CM | POA: Insufficient documentation

## 2012-03-12 DIAGNOSIS — R269 Unspecified abnormalities of gait and mobility: Secondary | ICD-10-CM | POA: Insufficient documentation

## 2012-03-12 DIAGNOSIS — I639 Cerebral infarction, unspecified: Secondary | ICD-10-CM

## 2012-03-12 MED ORDER — DICLOFENAC SODIUM 1 % TD GEL: 1.0000 "application " | Freq: Four times a day (QID) | TRANSDERMAL | Status: DC

## 2012-03-12 NOTE — Patient Instructions (Signed)
Continue with exercises at home, continue with PT.

## 2012-03-12 NOTE — Progress Notes (Signed)
Subjective:    Patient ID: Kimberly [name], female    DOB: 1960/09/11, 51 y.o.   MRN: [id number]  HPI Patient has a history of a CVA in January 2011, which left her with left hemiparesis. She is following up today after a Botox injection into her left lower leg. She states that the injection has not helped her at all. She is to not able to wear her AFO brace because of the increased tone in the calf muscle, and plantar flex position of her left foot. She also complains about left arm pain.She has just started with PT, but they only work on her LE, she states, that she needs an order for occupational therapy for her UE.  Pain Inventory Average Pain 4 Pain Right Now 4 My pain is dull  In the last 24 hours, has pain interfered with the following? General activity 6 Relation with others 6 Enjoyment of life 6 What TIME of day is your pain at its worst? n/a Sleep (in general) Fair  Pain is worse with: unsure Pain improves with: pacing activities Relief from Meds: 5  Mobility use a wheelchair transfers alone Do you have any goals in this area?  no  Function I need assistance with the following:  bathing, meal prep, household duties and shopping Do you have any goals in this area?  no  Neuro/Psych No problems in this area  Prior Studies Any changes since last visit?  no  Physicians involved in your care Any changes since last visit?  no   Family History  Problem Relation Age of Onset  . Cancer Father    History   Social History  . Marital Status: Married    Spouse Name: N/A    Number of Children: N/A  . Years of Education: N/A   Social History Main Topics  . Smoking status: Never Smoker   . Smokeless tobacco: Never Used  . Alcohol Use: No  . Drug Use: No  . Sexually Active: None   Other Topics Concern  . None   Social History Narrative  . None   History reviewed. No pertinent past surgical history. Past Medical History  Diagnosis Date  . Seizures   . Stroke    . Diabetes mellitus   . Hypertension    BP 138/68  Pulse 75  Resp 14  Ht 5\' 3"  (1.6 m)  Wt 250 lb (113.399 kg)  BMI 44.29 kg/m2  SpO2 93%     Review of Systems  Musculoskeletal: Positive for gait problem.  All other systems reviewed and are negative.       Objective:   Physical Exam Constitutional: She is oriented to person, place, and time. She appears well-developed and well-nourished.  Morbidly obese in wheelchair  HENT:  Head: Normocephalic.  Neck: Neck supple.  Musculoskeletal: She exhibits tenderness.  Neurological: She is alert and oriented to person, place, and time.  Skin: Skin is warm and dry.  Psychiatric: She has a normal mood and affect.   Symmetric normal motor tone is noted throughout, except increased muscle tone in left UE and more severe in left LE. Normal muscle bulk. Muscle testing reveals 5/5 muscle strength of the upper extremity, and 5/5 of the lower extremity, on the right, on the left UE 0-1/5, LE contracture of gastroc/achilles tendon. Fine motor movements are normal in the right hand, not possible in the left.  Sensory is decreased on the left side.         Assessment & Plan:  This is a 51 year old female with  1. CVA  2. Contracture of left achilles tendon  3. Leg pain in the left  4. Left arm pain  Plan :  Continue with medication, continue with exercising and stretching at home.Patient has just started  formal physical therapy for the left achilles tendon contracture. The patient needs an order for occupational therapy for the left shoulder pain, wrote that order today. Prescribed Voltaren gel for ankle pain after PT, and left shoulder pain . Follow up in 2 month.

## 2012-03-17 ENCOUNTER — Telehealth: Payer: Self-pay

## 2012-03-17 NOTE — Telephone Encounter (Signed)
Pharmacy would like to know the status on the prior auth for Voltaren gel.  [phone]

## 2012-03-17 NOTE — Telephone Encounter (Signed)
Spoke with pharmacy made them aware we have done prior auth but have not heard whether it was approved or not.

## 2012-03-24 ENCOUNTER — Telehealth: Payer: Self-pay | Admitting: Physical Medicine & Rehabilitation

## 2012-03-24 NOTE — Telephone Encounter (Signed)
I don't see where this was ordered for the patient

## 2012-03-24 NOTE — Telephone Encounter (Signed)
[employer] [facility] needs Rx for ROM device.

## 2012-03-26 ENCOUNTER — Telehealth: Payer: Self-pay | Admitting: Physical Medicine & Rehabilitation

## 2012-03-26 ENCOUNTER — Ambulatory Visit: Payer: BC Managed Care – PPO | Admitting: Rehabilitative and Restorative Service Providers"

## 2012-03-26 NOTE — Telephone Encounter (Signed)
Did we receive LMN for Dr to sign and fax back?

## 2012-04-08 ENCOUNTER — Telehealth: Payer: Self-pay

## 2012-04-08 NOTE — Telephone Encounter (Signed)
Jeanie called requesting information regarding ROM order for ankle.  Lm advising her to fax request.

## 2012-04-09 ENCOUNTER — Ambulatory Visit: Payer: BC Managed Care – PPO | Attending: Physical Medicine and Rehabilitation | Admitting: Occupational Therapy

## 2012-04-09 DIAGNOSIS — I69998 Other sequelae following unspecified cerebrovascular disease: Secondary | ICD-10-CM | POA: Insufficient documentation

## 2012-04-09 DIAGNOSIS — R269 Unspecified abnormalities of gait and mobility: Secondary | ICD-10-CM | POA: Insufficient documentation

## 2012-04-09 DIAGNOSIS — R262 Difficulty in walking, not elsewhere classified: Secondary | ICD-10-CM | POA: Insufficient documentation

## 2012-04-09 DIAGNOSIS — M6281 Muscle weakness (generalized): Secondary | ICD-10-CM | POA: Insufficient documentation

## 2012-04-09 DIAGNOSIS — IMO0001 Reserved for inherently not codable concepts without codable children: Secondary | ICD-10-CM | POA: Insufficient documentation

## 2012-04-16 ENCOUNTER — Encounter: Payer: BC Managed Care – PPO | Admitting: Occupational Therapy

## 2012-04-29 ENCOUNTER — Telehealth: Payer: Self-pay | Admitting: *Deleted

## 2012-04-29 NOTE — Telephone Encounter (Signed)
The last time she was here Kimberly [name] wrote an rx for a boot for her foot. She needs a letter for medical necessity signed in order to get this covered by her insurance.

## 2012-04-30 ENCOUNTER — Ambulatory Visit: Payer: BC Managed Care – PPO | Attending: Physical Medicine and Rehabilitation | Admitting: Occupational Therapy

## 2012-04-30 DIAGNOSIS — I69998 Other sequelae following unspecified cerebrovascular disease: Secondary | ICD-10-CM | POA: Insufficient documentation

## 2012-04-30 DIAGNOSIS — R269 Unspecified abnormalities of gait and mobility: Secondary | ICD-10-CM | POA: Insufficient documentation

## 2012-04-30 DIAGNOSIS — M6281 Muscle weakness (generalized): Secondary | ICD-10-CM | POA: Insufficient documentation

## 2012-04-30 DIAGNOSIS — IMO0001 Reserved for inherently not codable concepts without codable children: Secondary | ICD-10-CM | POA: Insufficient documentation

## 2012-04-30 DIAGNOSIS — R262 Difficulty in walking, not elsewhere classified: Secondary | ICD-10-CM | POA: Insufficient documentation

## 2012-04-30 NOTE — Telephone Encounter (Signed)
Is there a form I have to fill out

## 2012-04-30 NOTE — Telephone Encounter (Signed)
She needs a letter stating why it is medically necessary for her to have the brace.

## 2012-04-30 NOTE — Telephone Encounter (Signed)
Can you write this?

## 2012-05-07 ENCOUNTER — Ambulatory Visit: Payer: BC Managed Care – PPO | Admitting: Occupational Therapy

## 2012-05-14 ENCOUNTER — Encounter
Payer: BC Managed Care – PPO | Attending: Physical Medicine and Rehabilitation | Admitting: Physical Medicine and Rehabilitation

## 2012-05-14 ENCOUNTER — Encounter: Payer: BC Managed Care – PPO | Admitting: Occupational Therapy

## 2012-05-14 ENCOUNTER — Encounter: Payer: Self-pay | Admitting: Physical Medicine and Rehabilitation

## 2012-05-14 ENCOUNTER — Ambulatory Visit: Payer: BC Managed Care – PPO | Admitting: Physical Medicine and Rehabilitation

## 2012-05-14 VITALS — BP 118/69 | HR 153 | Resp 14 | Ht 63.0 in | Wt 250.0 lb

## 2012-05-14 DIAGNOSIS — M79609 Pain in unspecified limb: Secondary | ICD-10-CM | POA: Insufficient documentation

## 2012-05-14 DIAGNOSIS — I6350 Cerebral infarction due to unspecified occlusion or stenosis of unspecified cerebral artery: Secondary | ICD-10-CM

## 2012-05-14 DIAGNOSIS — I69959 Hemiplegia and hemiparesis following unspecified cerebrovascular disease affecting unspecified side: Secondary | ICD-10-CM | POA: Insufficient documentation

## 2012-05-14 DIAGNOSIS — I639 Cerebral infarction, unspecified: Secondary | ICD-10-CM

## 2012-05-14 DIAGNOSIS — M25519 Pain in unspecified shoulder: Secondary | ICD-10-CM | POA: Insufficient documentation

## 2012-05-14 DIAGNOSIS — M25569 Pain in unspecified knee: Secondary | ICD-10-CM | POA: Insufficient documentation

## 2012-05-14 DIAGNOSIS — G8190 Hemiplegia, unspecified affecting unspecified side: Secondary | ICD-10-CM

## 2012-05-14 DIAGNOSIS — M6240 Contracture of muscle, unspecified site: Secondary | ICD-10-CM | POA: Insufficient documentation

## 2012-05-14 NOTE — Patient Instructions (Signed)
Support your left leg if you sit on a recliner, Support your left leg with pillows when you lay on your right side.

## 2012-05-14 NOTE — Progress Notes (Signed)
Subjective:    Patient ID: Kimberly [name], female    DOB: Jun 12, 1961, 51 y.o.   MRN: [id number]  HPI Patient has a history of a CVA in January 2011, which left her with left hemiparesis. She is following up today after a Botox injection into her left lower leg. She states that the injection has not helped her at all. She states, that she has finished OT and PT, and would like to continue, but her insurance is not paying for more visits, she also states, that she now has medicare, before she was on medicaid.   Pain Inventory Average Pain 4 Pain Right Now 4 My pain is dull  In the last 24 hours, has pain interfered with the following? General activity 4 Relation with others 4 Enjoyment of life 4 What TIME of day is your pain at its worst? night Sleep (in general) Fair  Pain is worse with: inactivity Pain improves with: therapy/exercise Relief from Meds: 7  Mobility use a cane how many minutes can you walk? 5 ability to climb steps?  no do you drive?  no use a wheelchair  Function not employed: date last employed  Do you have any goals in this area?  no  Neuro/Psych weakness trouble walking  Prior Studies Any changes since last visit?  no  Physicians involved in your care Any changes since last visit?  no   Family History  Problem Relation Age of Onset  . Cancer Father    History   Social History  . Marital Status: Married    Spouse Name: N/A    Number of Children: N/A  . Years of Education: N/A   Social History Main Topics  . Smoking status: Never Smoker   . Smokeless tobacco: Never Used  . Alcohol Use: No  . Drug Use: No  . Sexually Active: None   Other Topics Concern  . None   Social History Narrative  . None   History reviewed. No pertinent past surgical history. Past Medical History  Diagnosis Date  . Seizures   . Stroke   . Diabetes mellitus   . Hypertension    BP 118/69  Pulse 153  Resp 14  Ht 5\' 3"  (1.6 m)  Wt 250 lb (113.399 kg)   BMI 44.29 kg/m2  SpO2 99%     Review of Systems  Musculoskeletal: Positive for myalgias, arthralgias and gait problem.  Neurological: Positive for weakness.  All other systems reviewed and are negative.       Objective:   Physical Exam Constitutional: She is oriented to person, place, and time. She appears well-developed and well-nourished.  Morbidly obese in wheelchair  HENT:  Head: Normocephalic.  Neck: Neck supple.  Musculoskeletal: She exhibits tenderness.  Neurological: She is alert and oriented to person, place, and time.  Skin: Skin is warm and dry.  Psychiatric: She has a normal mood and affect.  Symmetric normal motor tone is noted throughout, except increased muscle tone in left UE and more severe in left LE. Normal muscle bulk. Muscle testing reveals 5/5 muscle strength of the upper extremity, and 5/5 of the lower extremity, on the right, on the left UE 0-1/5, LE contracture of gastroc/achilles tendon. Fine motor movements are normal in the right hand, not possible in the left.  Sensory is decreased on the left side.         Assessment & Plan:  This is a 51 year old female with  1. CVA  2. Contracture of left  achilles tendon  3. Leg pain in the left  4. Left arm pain  5. Right shoulder and knee pain after being more active. Plan :  Continue with medication, continue with exercising and stretching at home.Patient has finished formal physical therapy for the left achilles tendon contracture and OT, she just got about 4 appointments. The patient has medicare now, so hopefully her insurance will pay for some more sessions. I ordered PT and OT today. The patient states, that the Voltaren gel prescribed at last visit has given her relief.  Follow up in 2 month.

## 2012-06-25 ENCOUNTER — Ambulatory Visit: Payer: BC Managed Care – PPO | Admitting: Occupational Therapy

## 2012-07-01 DIAGNOSIS — I6330 Cerebral infarction due to thrombosis of unspecified cerebral artery: Secondary | ICD-10-CM | POA: Insufficient documentation

## 2012-07-01 DIAGNOSIS — M79609 Pain in unspecified limb: Secondary | ICD-10-CM | POA: Insufficient documentation

## 2012-07-01 DIAGNOSIS — G40219 Localization-related (focal) (partial) symptomatic epilepsy and epileptic syndromes with complex partial seizures, intractable, without status epilepticus: Secondary | ICD-10-CM | POA: Insufficient documentation

## 2012-07-01 DIAGNOSIS — G40209 Localization-related (focal) (partial) symptomatic epilepsy and epileptic syndromes with complex partial seizures, not intractable, without status epilepticus: Secondary | ICD-10-CM | POA: Insufficient documentation

## 2012-07-16 ENCOUNTER — Encounter
Payer: BC Managed Care – PPO | Attending: Physical Medicine and Rehabilitation | Admitting: Physical Medicine and Rehabilitation

## 2012-07-16 ENCOUNTER — Encounter: Payer: Self-pay | Admitting: Physical Medicine and Rehabilitation

## 2012-07-16 VITALS — BP 127/57 | HR 78 | Resp 14 | Ht 63.5 in | Wt 256.0 lb

## 2012-07-16 DIAGNOSIS — M6702 Short Achilles tendon (acquired), left ankle: Secondary | ICD-10-CM

## 2012-07-16 DIAGNOSIS — I69959 Hemiplegia and hemiparesis following unspecified cerebrovascular disease affecting unspecified side: Secondary | ICD-10-CM | POA: Insufficient documentation

## 2012-07-16 DIAGNOSIS — M79609 Pain in unspecified limb: Secondary | ICD-10-CM | POA: Insufficient documentation

## 2012-07-16 DIAGNOSIS — M25519 Pain in unspecified shoulder: Secondary | ICD-10-CM | POA: Insufficient documentation

## 2012-07-16 DIAGNOSIS — M6240 Contracture of muscle, unspecified site: Secondary | ICD-10-CM | POA: Insufficient documentation

## 2012-07-16 DIAGNOSIS — I639 Cerebral infarction, unspecified: Secondary | ICD-10-CM

## 2012-07-16 NOTE — Patient Instructions (Signed)
Continue with stretching exercises , for your arm and achilles tendon, call [facility] and ask whether they can replace the velcro on your brace, or whether you need a new brace.

## 2012-07-16 NOTE — Progress Notes (Signed)
Subjective:    Patient ID: Kimberly [name], female    DOB: [birth year], 51 y.o.   MRN: [id number]  HPI Patient has a history of a CVA in January 2011, which left her with left hemiparesis. She is following up today after a Botox injection into her left lower leg. She states that the injection has not helped her at all. She states, that she has finished OT and PT, and she does not want to continue, at this point. She states, that she would like to get a power chair, because she can not move her wheel chair by herself, and is always depending on somebody else to push her.  Pain Inventory Average Pain 0 Pain Right Now 0 My pain is dull  In the last 24 hours, has pain interfered with the following? General activity 0 Relation with others 0 Enjoyment of life 0 What TIME of day is your pain at its worst? night Sleep (in general) Good  Pain is worse with: other Pain improves with: rest, heat/ice, therapy/exercise and medication Relief from Meds: 7  Mobility walk with assistance use a cane ability to climb steps?  no do you drive?  no use a wheelchair needs help with transfers  Function disabled: date disabled  I need assistance with the following:  dressing, bathing, meal prep, household duties and shopping  Neuro/Psych bladder control problems  Prior Studies Any changes since last visit?  no  Physicians involved in your care Any changes since last visit?  no   Family History  Problem Relation Age of Onset  . Cancer Father    History   Social History  . Marital Status: Married    Spouse Name: N/A    Number of Children: N/A  . Years of Education: N/A   Social History Main Topics  . Smoking status: Never Smoker   . Smokeless tobacco: Never Used  . Alcohol Use: No  . Drug Use: No  . Sexually Active: None   Other Topics Concern  . None   Social History Narrative  . None   History reviewed. No pertinent past surgical history. Past Medical History  Diagnosis  Date  . Seizures   . Stroke   . Diabetes mellitus   . Hypertension    BP 127/57  Pulse 78  Resp 14  Ht 5' 3.5" (1.613 m)  Wt 256 lb (116.121 kg)  BMI 44.64 kg/m2  SpO2 98%    Review of Systems  Constitutional: Positive for unexpected weight change.  All other systems reviewed and are negative.       Objective:   Physical Exam Constitutional: She is oriented to person, place, and time. She appears well-developed and well-nourished.  Morbidly obese in wheelchair  HENT:  Head: Normocephalic.  Neck: Neck supple.  Musculoskeletal: She exhibits tenderness.  Neurological: She is alert and oriented to person, place, and time.  Skin: Skin is warm and dry.  Psychiatric: She has a normal mood and affect.  Symmetric normal motor tone is noted throughout, except increased muscle tone in left UE and more severe in left LE. Normal muscle bulk. Muscle testing reveals 5/5 muscle strength of the upper extremity, and 5/5 of the lower extremity, on the right, on the left UE 0-1/5, LE contracture of gastroc/achilles tendon. Fine motor movements are normal in the right hand, not possible in the left.  Sensory is decreased on the left side.         Assessment & Plan:  This is a 51  year old female with  1. CVA  2. Contracture of left achilles tendon  3. Leg pain in the left  4. Left arm pain  5. Right shoulder and knee pain after being more active.  Plan :  Continue with medication, continue with exercising and stretching at home.Patient has finished formal physical therapy for the left achilles tendon contracture and OT.The patient states, that the Voltaren gel prescribed at last visit has given her relief, for her shoulder pain.Ordered HH to evaluate patient for a power chair, patient already talked to [facility], and they might send [place] the required paper work, if [facility] approves. I think it would be legitimate, to prescribe a power chair to the patient, because of the level of her  disability/ restrictions.  Follow up in 3 month.

## 2012-07-17 ENCOUNTER — Telehealth: Payer: Self-pay

## 2012-07-17 NOTE — Telephone Encounter (Signed)
Patient called about her power chair.  She is going to have [employer] send [place] information.

## 2012-07-26 ENCOUNTER — Emergency Department (HOSPITAL_BASED_OUTPATIENT_CLINIC_OR_DEPARTMENT_OTHER)
Admission: EM | Admit: 2012-07-26 | Discharge: 2012-07-27 | Disposition: A | Discharge status: Home / Self Care | Payer: BC Managed Care – PPO | Attending: Emergency Medicine | Admitting: Emergency Medicine

## 2012-07-26 ENCOUNTER — Encounter (HOSPITAL_BASED_OUTPATIENT_CLINIC_OR_DEPARTMENT_OTHER): Payer: Self-pay | Admitting: *Deleted

## 2012-07-26 DIAGNOSIS — Z8673 Personal history of transient ischemic attack (TIA), and cerebral infarction without residual deficits: Secondary | ICD-10-CM | POA: Insufficient documentation

## 2012-07-26 DIAGNOSIS — E119 Type 2 diabetes mellitus without complications: Secondary | ICD-10-CM | POA: Insufficient documentation

## 2012-07-26 DIAGNOSIS — Z794 Long term (current) use of insulin: Secondary | ICD-10-CM | POA: Insufficient documentation

## 2012-07-26 DIAGNOSIS — L600 Ingrowing nail: Secondary | ICD-10-CM | POA: Insufficient documentation

## 2012-07-26 DIAGNOSIS — G40909 Epilepsy, unspecified, not intractable, without status epilepticus: Secondary | ICD-10-CM | POA: Insufficient documentation

## 2012-07-26 DIAGNOSIS — IMO0002 Reserved for concepts with insufficient information to code with codable children: Secondary | ICD-10-CM

## 2012-07-26 DIAGNOSIS — Z79899 Other long term (current) drug therapy: Secondary | ICD-10-CM | POA: Insufficient documentation

## 2012-07-26 DIAGNOSIS — I10 Essential (primary) hypertension: Secondary | ICD-10-CM | POA: Insufficient documentation

## 2012-07-26 DIAGNOSIS — L03039 Cellulitis of unspecified toe: Secondary | ICD-10-CM | POA: Insufficient documentation

## 2012-07-26 DIAGNOSIS — Z7901 Long term (current) use of anticoagulants: Secondary | ICD-10-CM | POA: Insufficient documentation

## 2012-07-26 LAB — GLUCOSE, CAPILLARY: Glucose-Capillary: 278 mg/dL — ABNORMAL HIGH (ref 70–99)

## 2012-07-26 MED ORDER — BUPIVACAINE HCL (PF) 0.5 % IJ SOLN
20.0000 mL | Freq: Once | INTRAMUSCULAR | Status: DC
  Filled 2012-07-26: qty 10

## 2012-07-26 NOTE — ED Notes (Signed)
Left mid great toe draining clear fluid

## 2012-07-26 NOTE — ED Notes (Signed)
Pt states her left great toe is swollen and painful "I think it's the nail". Oozing. Pt is a diabetic.

## 2012-07-26 NOTE — ED Provider Notes (Signed)
History  This chart was scribed for Kimberly Seamen, MD by Ardeen Jourdain, ED Scribe. This patient was seen in room MH02/MH02 and the patient's care was started at 2324.  CSN: [id number]  Arrival date & time 07/26/12  2257   First MD Initiated Contact with Patient 07/26/12 2324      Chief Complaint  Patient presents with  . Toe Pain     The history is provided by the patient. No language interpreter was used.    Kimberly [name] is a 51 y.o. female who presents to the Emergency Department complaining of left great toe pain and discharge. She states that the pain started this morning and has been gradually worsening. She describes it as an achy pain centered in the great toe that radiates up her leg. She denies fever, chills, nausea and emesis as associated symptoms.  Past Medical History  Diagnosis Date  . Seizures   . Stroke   . Diabetes mellitus   . Hypertension     History reviewed. No pertinent past surgical history.  Family History  Problem Relation Age of Onset  . Cancer Father     History  Substance Use Topics  . Smoking status: Never Smoker   . Smokeless tobacco: Never Used  . Alcohol Use: No   No OB history available.   Review of Systems  Constitutional: Negative for fever, chills and diaphoresis.  Gastrointestinal: Negative for nausea and vomiting.  Skin:       Ingrown toenail   All other systems reviewed and are negative.    Allergies  Review of patient's allergies indicates no known allergies.  Home Medications   Current Outpatient Rx  Name  Route  Sig  Dispense  Refill  . VITAMIN D 1000 UNITS PO TABS   Oral   Take 1,000 Units by mouth daily.         Marland Kitchen DICLOFENAC SODIUM 1 % TD GEL   Topical   Apply 1 application topically 4 (four) times daily.   2 Tube   2   . COLACE PO   Oral   Take 1 capsule by mouth daily as needed. Patient used this medication for constipation.         Marland Kitchen GABAPENTIN 100 MG PO CAPS   Oral   Take 100 mg by mouth 3  (three) times daily.          Marland Kitchen GLIMEPIRIDE 1 MG PO TABS   Oral   Take 1 mg by mouth daily before breakfast.         . INSULIN GLARGINE 100 UNIT/ML  SOLN   Subcutaneous   Inject 46 Units into the skin at bedtime.          Marland Kitchen JANUMET 50-500 MG PO TABS               . LACOSAMIDE 200 MG PO TABS   Oral   Take by mouth 2 (two) times daily.           Marland Kitchen METOPROLOL SUCCINATE ER 25 MG PO TB24   Oral   Take 25 mg by mouth daily.           . NORETHINDRONE ACETATE 5 MG PO TABS               . PANTOPRAZOLE SODIUM 20 MG PO TBEC   Oral   Take 20 mg by mouth daily.           Marland Kitchen SIMVASTATIN 20 MG PO  TABS   Oral   Take 20 mg by mouth every evening.         . WARFARIN SODIUM 10 MG PO TABS   Oral   Take 10 mg by mouth daily.             Triage Vitals: BP 142/82  Pulse 90  Temp 98 F (36.7 C) (Oral)  Resp 20  Ht 5\' 3"  (1.6 m)  Wt 256 lb (116.121 kg)  BMI 45.35 kg/m2  SpO2 99%  Physical Exam  Nursing note and vitals reviewed. Constitutional: She is oriented to person, place, and time. She appears well-developed and well-nourished. No distress.  HENT:  Head: Normocephalic and atraumatic.  Eyes: Conjunctivae normal and EOM are normal. Pupils are equal, round, and reactive to light.       Mild arcus senilis   Neck: Normal range of motion. Neck supple. No tracheal deviation present.  Cardiovascular: Normal rate, regular rhythm and normal heart sounds.   Pulmonary/Chest: Effort normal and breath sounds normal. No respiratory distress.  Abdominal: Soft. She exhibits no distension. There is no tenderness.  Musculoskeletal: Normal range of motion. She exhibits edema.       Ingrown left medial great toe nail with erythema and swelling consistent with developing paronychia, extremities unremarkable otherwise, pulses normal, trace edema in lower legs.   Neurological: She is alert and oriented to person, place, and time.       Left hemiparesis   Skin: Skin is warm  and dry.  Psychiatric: She has a normal mood and affect. Her behavior is normal.    ED Course  Procedures (including critical care time)  DIAGNOSTIC STUDIES: Oxygen Saturation is 99% on room air, normal by my interpretation.    COORDINATION OF CARE:  11:34 PM: Discussed treatment plan which includes removing the ingrown portion of the toenail with pt at bedside and pt agreed to plan.   INCISION AND DRAINAGE Performed by: Paula Libra L Consent: Verbal consent obtained. Risks and benefits: risks, benefits and alternatives were discussed Type: Ingrown toenail with early paronychia   Body area: Left great toe, medially  Anesthesia: local infiltration  Incision was made with a scalpel. Wedge of toenail was removed with scissors.  Anesthesia: Bupivacaine 0.5% 6 mL plus local infiltration with lidocaine 2% 1 mL  Complexity: Simple  Drainage: Sanguinous   Patient tolerance: Patient tolerated the procedure well with no immediate complications.      MDM   I personally performed the services described in this documentation, which was scribed in my presence.  The recorded information has been reviewed and accurate.    Kimberly Seamen, MD 07/27/12 0025

## 2012-07-26 NOTE — ED Notes (Signed)
MD at bedside. 

## 2012-07-27 MED ORDER — HYDROCODONE-ACETAMINOPHEN 5-325 MG PO TABS: 1.0000 | ORAL_TABLET | Freq: Four times a day (QID) | ORAL | Status: DC | PRN

## 2012-07-27 MED ORDER — LIDOCAINE HCL 2 % IJ SOLN
INTRAMUSCULAR | Status: AC
  Filled 2012-07-27: qty 20

## 2012-08-08 ENCOUNTER — Encounter: Payer: BC Managed Care – PPO | Admitting: Physical Medicine & Rehabilitation

## 2012-09-03 ENCOUNTER — Encounter: Payer: Medicare HMO | Admitting: Physical Medicine & Rehabilitation

## 2012-09-22 ENCOUNTER — Ambulatory Visit: Payer: BC Managed Care – PPO | Admitting: Physical Therapy

## 2012-09-24 ENCOUNTER — Encounter: Payer: Medicare HMO | Admitting: Physical Medicine & Rehabilitation

## 2012-09-28 ENCOUNTER — Other Ambulatory Visit: Payer: Self-pay | Admitting: Physical Medicine & Rehabilitation

## 2012-10-05 ENCOUNTER — Other Ambulatory Visit: Payer: Self-pay | Admitting: Physical Medicine & Rehabilitation

## 2012-10-08 ENCOUNTER — Telehealth: Payer: Self-pay

## 2012-10-08 NOTE — Telephone Encounter (Signed)
Someone from [employer] called regarding fax.

## 2012-10-13 NOTE — Telephone Encounter (Signed)
Unable to recall [employer] regarding paperwork.  No call back number.

## 2012-10-15 ENCOUNTER — Encounter: Payer: Self-pay | Admitting: Physical Medicine & Rehabilitation

## 2012-10-15 ENCOUNTER — Encounter
Payer: BC Managed Care – PPO | Attending: Physical Medicine and Rehabilitation | Admitting: Physical Medicine & Rehabilitation

## 2012-10-15 VITALS — BP 132/83 | HR 80 | Resp 16 | Ht 63.0 in | Wt 256.0 lb

## 2012-10-15 DIAGNOSIS — G8110 Spastic hemiplegia affecting unspecified side: Secondary | ICD-10-CM | POA: Insufficient documentation

## 2012-10-15 DIAGNOSIS — IMO0001 Reserved for inherently not codable concepts without codable children: Secondary | ICD-10-CM | POA: Insufficient documentation

## 2012-10-15 MED ORDER — BACLOFEN 10 MG PO TABS: 10.0000 mg | ORAL_TABLET | Freq: Three times a day (TID) | ORAL | Status: DC

## 2012-10-15 NOTE — Progress Notes (Signed)
  Subjective:    Patient ID: Kimberly [name], female    DOB: 1961/08/19, 52 y.o.   MRN: [id number]  HPI   .cp Review of Systems     Objective:   Physical Exam        Assessment & Plan:

## 2012-10-15 NOTE — Patient Instructions (Addendum)
PLEASE HAVE [employer] CONTACT us REGARDING YOUR POWER WHEEL CHAIR RX  TAKE BACLOFEN 10MG  TWICE DAILY FOR 3 DAYS THEN INCREASE TO THREE X PER DAY.

## 2012-10-15 NOTE — Progress Notes (Signed)
Subjective:    Patient ID: Kimberly [name], female    DOB: [birth year], 52 y.o.   MRN: [id number]  HPI  Kimberly [name] is back regarding her right MCA infarct. I last saw her a couple years ago. She saw my PA in November regarding tightness in her right Achilles tendon.  She hasn't seen a lot of change but her stretching is not substantial and irregular. She has a JAS splint to help with stretching of her heel cord. She remains quite limited with mobility. She can transfer short dx using her walker and move limited dx with her wheelchair but it's quite difficult for her. She has begun the process of pursuing a powered wheelchair due to these difficulties.  From a pain standpoint, she is having pain in her left side and behind the left knee. She recalls falling last year and hitting her left rib cage but she's not sure that the fall caused the pain she is having now. The pain is generally manageable. Occasionally, she will use a tylenol for pain. She has voltaren gel which she uses more often than not on her right hand. She remains on gabapentin for neuropathic pain.   She reports that her sugars have been under control. The other day, in fact, they were low.   Pain Inventory Average Pain 4 Pain Right Now 4 My pain is dull  In the last 24 hours, has pain interfered with the following? General activity 3 Relation with others 4 Enjoyment of life 3 What TIME of day is your pain at its worst? daytime Sleep (in general) Fair  Pain is worse with: sitting Pain improves with: medication Relief from Meds: 7  Mobility use a cane ability to climb steps?  no do you drive?  no  Function disabled: date disabled see chart  Neuro/Psych No problems in this area  Prior Studies Any changes since last visit?  no  Physicians involved in your care Any changes since last visit?  no   Family History  Problem Relation Age of Onset  . Cancer Father    History   Social History  . Marital Status:  Married    Spouse Name: N/A    Number of Children: N/A  . Years of Education: N/A   Social History Main Topics  . Smoking status: Never Smoker   . Smokeless tobacco: Never Used  . Alcohol Use: No  . Drug Use: No  . Sexually Active: None   Other Topics Concern  . None   Social History Narrative  . None   History reviewed. No pertinent past surgical history. Past Medical History  Diagnosis Date  . Seizures   . Stroke   . Diabetes mellitus   . Hypertension    BP 132/83  Pulse 80  Resp 16  Ht 5\' 3"  (1.6 m)  Wt 256 lb (116.121 kg)  BMI 45.36 kg/m2  SpO2 98%     Review of Systems  All other systems reviewed and are negative.       Objective:   Physical Exam  General: Alert and oriented x 3, No apparent distress HEENT: Head is normocephalic, atraumatic, PERRLA, EOMI, sclera anicteric, oral mucosa pink and moist, dentition intact, ext ear canals clear,  Neck: Supple without JVD or lymphadenopathy Heart: Reg rate and rhythm. No murmurs rubs or gallops Chest: CTA bilaterally without wheezes, rales, or rhonchi; no distress Abdomen: Soft, non-tender, non-distended, bowel sounds positive. Extremities: No clubbing, cyanosis, or edema. Pulses are 2+ Skin: Clean and intact  without signs of breakdown Neuro: fair insight and awareness. Oriented x 3. Follows all commands. Left upper ist 1/5 at pec major with 1 1+ tone noted. LLE is 1-2/5 left hip and knee. 0/5 at left ankle with equinovarus position noted. Tone is 4 at ankle. Sensation is diminished but she still senses pain on the left. DTR's are 3+ on left. Speech is generally clear. She has a mild left central 7. Musculoskeletal: she has a mild subluxation of .25 inches at left shoulder. Posture is fair. Psych: Pt's affect is appropriate. Pt is cooperative         Assessment & Plan:  Assessment: 1.Remote rght MCA infarct 2. Positive Lupus coagulant- on coumadin 3. Myofascial pain 4. Spastic left hemiparesis due to  #1  Plan: 1. Trial of baclofen 10mg  bid to tid to treat her left sided spasticity with a more broad stroke. 2. Set up with Botox to the left gastroc and tibialis posterior. She will need to follow this up with therapy and JAS splint 3. Sandrika is appropriate for a powered wheelchair given her profound left hemiparesis and spasticity. She is competent and motivated to use a powered chair within her home and in the community. The chair would allow her to be more independent with self care and mobility as a whole. As it stands, she is unsafe to do more than simple transfers on her own, and she cannot propel a standard chair given her size and neurological deficits. A scooter would not meet her needs given her spasticity and problems with trunk control. 4. I will see her back for botox, 400u to left calf. 30 minutes of face to face patient care time were spent during this visit. All questions were encouraged and answered.

## 2012-10-18 ENCOUNTER — Emergency Department (HOSPITAL_BASED_OUTPATIENT_CLINIC_OR_DEPARTMENT_OTHER)
Admission: EM | Admit: 2012-10-18 | Discharge: 2012-10-18 | Disposition: A | Discharge status: Home / Self Care | Payer: BC Managed Care – PPO | Attending: Emergency Medicine | Admitting: Emergency Medicine

## 2012-10-18 ENCOUNTER — Encounter (HOSPITAL_BASED_OUTPATIENT_CLINIC_OR_DEPARTMENT_OTHER): Payer: Self-pay | Admitting: Emergency Medicine

## 2012-10-18 DIAGNOSIS — Z79899 Other long term (current) drug therapy: Secondary | ICD-10-CM | POA: Insufficient documentation

## 2012-10-18 DIAGNOSIS — J029 Acute pharyngitis, unspecified: Secondary | ICD-10-CM | POA: Insufficient documentation

## 2012-10-18 DIAGNOSIS — E119 Type 2 diabetes mellitus without complications: Secondary | ICD-10-CM | POA: Insufficient documentation

## 2012-10-18 DIAGNOSIS — Z8673 Personal history of transient ischemic attack (TIA), and cerebral infarction without residual deficits: Secondary | ICD-10-CM | POA: Insufficient documentation

## 2012-10-18 DIAGNOSIS — Z791 Long term (current) use of non-steroidal anti-inflammatories (NSAID): Secondary | ICD-10-CM | POA: Insufficient documentation

## 2012-10-18 DIAGNOSIS — G40909 Epilepsy, unspecified, not intractable, without status epilepticus: Secondary | ICD-10-CM | POA: Insufficient documentation

## 2012-10-18 DIAGNOSIS — Z794 Long term (current) use of insulin: Secondary | ICD-10-CM | POA: Insufficient documentation

## 2012-10-18 DIAGNOSIS — Z7901 Long term (current) use of anticoagulants: Secondary | ICD-10-CM | POA: Insufficient documentation

## 2012-10-18 DIAGNOSIS — K122 Cellulitis and abscess of mouth: Secondary | ICD-10-CM

## 2012-10-18 DIAGNOSIS — I10 Essential (primary) hypertension: Secondary | ICD-10-CM | POA: Insufficient documentation

## 2012-10-18 MED ORDER — DIPHENHYDRAMINE HCL 25 MG PO TABS: 25.0000 mg | ORAL_TABLET | Freq: Four times a day (QID) | ORAL | Status: DC

## 2012-10-18 MED ORDER — PREDNISONE 20 MG PO TABS: 40.0000 mg | ORAL_TABLET | Freq: Every day | ORAL | Status: DC

## 2012-10-18 NOTE — ED Notes (Signed)
Pt states she had sensation of Foreign Body in throat.  Uvula is enlarged, red and swollen.  No pain, no fever.  Started taking baclofen two days ago.

## 2012-10-18 NOTE — ED Provider Notes (Signed)
Medical screening examination/treatment/procedure(s) were performed by non-physician practitioner and as supervising physician I was immediately available for consultation/collaboration.  Geoffery Lyons, MD 10/18/12 [phone]

## 2012-10-18 NOTE — ED Provider Notes (Signed)
History     CSN: [id number]  Arrival date & time 10/18/12  1145   First MD Initiated Contact with Patient 10/18/12 1212      Chief Complaint  Patient presents with  . Oral Swelling    (Consider location/radiation/quality/duration/timing/severity/associated sxs/prior treatment) HPI Comments: Patient is a 52 year old female who presents with a sensation of a foreign body in her throat. Patient reports the sensation started this morning when she woke up. She reports trying to clear her throat to break up any mucous or phlegm but that did not provide any relief. She denies pain, fever, SOB, throat closing or any other associated symptoms. Patient reports starting to take baclofen 2 days ago which is her only new exposure. No aggravating/alleviating factors.    Past Medical History  Diagnosis Date  . Seizures   . Stroke   . Diabetes mellitus   . Hypertension     No past surgical history on file.  Family History  Problem Relation Age of Onset  . Cancer Father     History  Substance Use Topics  . Smoking status: Never Smoker   . Smokeless tobacco: Never Used  . Alcohol Use: No    OB History   Grav Para Term Preterm Abortions TAB SAB Ect Mult Living                  Review of Systems  HENT: Positive for sore throat.   All other systems reviewed and are negative.    Allergies  Review of patient's allergies indicates no known allergies.  Home Medications   Current Outpatient Rx  Name  Route  Sig  Dispense  Refill  . baclofen (LIORESAL) 10 MG tablet   Oral   Take 1 tablet (10 mg total) by mouth 3 (three) times daily.   90 each   3   . cholecalciferol (VITAMIN D) 1000 UNITS tablet   Oral   Take 1,000 Units by mouth daily.         . diclofenac sodium (VOLTAREN) 1 % GEL   Topical   Apply 1 application topically 4 (four) times daily.   2 Tube   2   . Docusate Sodium (COLACE PO)   Oral   Take 1 capsule by mouth daily as needed. Patient used this  medication for constipation.         . gabapentin (NEURONTIN) 100 MG capsule   Oral   Take 100 mg by mouth 3 (three) times daily.          Marland Kitchen glimepiride (AMARYL) 1 MG tablet   Oral   Take 1 mg by mouth daily before breakfast.         . HYDROcodone-acetaminophen (NORCO/VICODIN) 5-325 MG per tablet   Oral   Take 1-2 tablets by mouth every 6 (six) hours as needed for pain.   10 tablet   0   . insulin glargine (LANTUS) 100 UNIT/ML injection   Subcutaneous   Inject 46 Units into the skin at bedtime.          Marland Kitchen JANUMET 50-500 MG per tablet               . lacosamide (VIMPAT) 200 MG TABS   Oral   Take by mouth 2 (two) times daily.           . metoprolol succinate (TOPROL-XL) 25 MG 24 hr tablet   Oral   Take 25 mg by mouth daily.           Marland Kitchen  norethindrone (AYGESTIN) 5 MG tablet               . pantoprazole (PROTONIX) 20 MG tablet   Oral   Take 20 mg by mouth daily.           . simvastatin (ZOCOR) 20 MG tablet   Oral   Take 20 mg by mouth every evening.         . warfarin (COUMADIN) 10 MG tablet   Oral   Take 10 mg by mouth daily.             BP 146/81  Pulse 83  Temp(Src) 98.3 F (36.8 C) (Oral)  Resp 18  Ht 5\' 3"  (1.6 m)  Wt 260 lb (117.935 kg)  BMI 46.07 kg/m2  SpO2 99%  Physical Exam  Nursing note and vitals reviewed. Constitutional: She appears well-developed and well-nourished. No distress.  HENT:  Head: Normocephalic and atraumatic.  Edematous and erythematous uvula that is non obstructing. No tonsillar edema or erythema.   Eyes: Conjunctivae are normal.  Neck: Normal range of motion. Neck supple.  Cardiovascular: Normal rate and regular rhythm.  Exam reveals no gallop and no friction rub.   No murmur heard. Pulmonary/Chest: Effort normal and breath sounds normal. She has no wheezes. She has no rales. She exhibits no tenderness.  Abdominal: Soft. There is no tenderness.  Musculoskeletal: Normal range of motion.   Lymphadenopathy:    She has no cervical adenopathy.  Neurological: She is alert.  Speech is goal-oriented. Moves limbs without ataxia.   Skin: Skin is warm and dry.  Psychiatric: She has a normal mood and affect. Her behavior is normal.    ED Course  Procedures (including critical care time)  Labs Reviewed - No data to display No results found.   1. Uvulitis       MDM  12:18 PM Patient likely has uvulitis. I will treat her with prednisone and benadryl. Patient is afebrile and denies any difficulty breathing or throat closing. Vitals stable for discharge. Patient instructed to return with worsening or concerning symptoms.         Emilia Beck, PA-C 10/18/12 1228

## 2012-10-20 ENCOUNTER — Telehealth: Payer: Self-pay

## 2012-10-20 NOTE — Telephone Encounter (Signed)
Kimberly [name] with [facility] was calling to follow up on forms.  They need a letter of medical necessity and physical therapy evaluation.  Please advise.  RUEA#[id number] fax [phone]

## 2012-10-21 NOTE — Telephone Encounter (Signed)
The patient has a PT evaluation and we have no forms as far as I know. Her last office note was geared to justify medical necessity

## 2012-10-22 NOTE — Telephone Encounter (Signed)
[facility] called and said they received the form but still need letter of medical necessity.  Last office note faxed to [phone].

## 2012-10-25 ENCOUNTER — Other Ambulatory Visit: Payer: Self-pay | Admitting: Physical Medicine & Rehabilitation

## 2012-10-29 ENCOUNTER — Ambulatory Visit: Payer: BC Managed Care – PPO | Attending: Physical Medicine and Rehabilitation | Admitting: Physical Therapy

## 2012-10-29 ENCOUNTER — Telehealth: Payer: Self-pay

## 2012-10-29 DIAGNOSIS — I69998 Other sequelae following unspecified cerebrovascular disease: Secondary | ICD-10-CM | POA: Insufficient documentation

## 2012-10-29 DIAGNOSIS — IMO0001 Reserved for inherently not codable concepts without codable children: Secondary | ICD-10-CM

## 2012-10-29 DIAGNOSIS — M6281 Muscle weakness (generalized): Secondary | ICD-10-CM | POA: Insufficient documentation

## 2012-10-29 DIAGNOSIS — R269 Unspecified abnormalities of gait and mobility: Secondary | ICD-10-CM | POA: Insufficient documentation

## 2012-10-29 DIAGNOSIS — R262 Difficulty in walking, not elsewhere classified: Secondary | ICD-10-CM | POA: Insufficient documentation

## 2012-10-29 DIAGNOSIS — G8110 Spastic hemiplegia affecting unspecified side: Secondary | ICD-10-CM

## 2012-10-29 NOTE — Telephone Encounter (Signed)
Kristein with rehab has called and says order for PT and wheelchair evaluation has expired due to patient having transportation issues.  She is requesting a new order be faxed to [phone].  Patient has an appointment 10/29/12.  New order placed and faxed.

## 2012-11-26 ENCOUNTER — Encounter: Payer: Self-pay | Admitting: Physical Medicine & Rehabilitation

## 2012-11-26 ENCOUNTER — Encounter
Payer: BC Managed Care – PPO | Attending: Physical Medicine and Rehabilitation | Admitting: Physical Medicine & Rehabilitation

## 2012-11-26 VITALS — BP 132/85 | HR 84 | Resp 16 | Ht 63.0 in | Wt 250.0 lb

## 2012-11-26 DIAGNOSIS — I6350 Cerebral infarction due to unspecified occlusion or stenosis of unspecified cerebral artery: Secondary | ICD-10-CM

## 2012-11-26 DIAGNOSIS — M6702 Short Achilles tendon (acquired), left ankle: Secondary | ICD-10-CM

## 2012-11-26 DIAGNOSIS — G8110 Spastic hemiplegia affecting unspecified side: Secondary | ICD-10-CM | POA: Insufficient documentation

## 2012-11-26 DIAGNOSIS — I639 Cerebral infarction, unspecified: Secondary | ICD-10-CM

## 2012-11-26 DIAGNOSIS — IMO0001 Reserved for inherently not codable concepts without codable children: Secondary | ICD-10-CM

## 2012-11-26 DIAGNOSIS — M6240 Contracture of muscle, unspecified site: Secondary | ICD-10-CM

## 2012-11-26 NOTE — Patient Instructions (Signed)
CALL ME WITH ANY PROBLEMS OR QUESTIONS (#[phone]).  HAVE A GOOD DAY  

## 2012-11-26 NOTE — Progress Notes (Signed)
Subjective:    Patient ID: Kimberly [name], female    DOB: 1961-03-14, 52 y.o.   MRN: [id number]  HPI  Kimberly [name] is here primarily for a wheelchair evaluation. She has been seen by PT for a formal wheelchair assessment and recommendations were made and specs were provided to me today.    She has not been approved for botox apparently. She doesn't recall hearing anything from my office.. She is using her JAS splint, but usually only 30 minutes per day. She wears her AFO all day and is beginning to have pain while she wears it. She had some repairs done by [facility], and they mentioned to her about a replacement brace.  She complains of right wrist pain and occasional numbness. She is wearing a splint at night on the right hand.   Pain Inventory Average Pain 4 Pain Right Now 4 My pain is dull  In the last 24 hours, has pain interfered with the following? General activity 4 Relation with others n/a Enjoyment of life n/a What TIME of day is your pain at its worst? evening Sleep (in general) n/a  Pain is worse with: n/a Pain improves with: rest Relief from Meds: 9  Mobility use a cane ability to climb steps?  no do you drive?  no use a wheelchair transfers alone  Function Do you have any goals in this area?  no  Neuro/Psych No problems in this area  Prior Studies n/a  Physicians involved in your care Any changes since last visit?  no   Family History  Problem Relation Age of Onset  . Cancer Father    History   Social History  . Marital Status: Married    Spouse Name: N/A    Number of Children: N/A  . Years of Education: N/A   Social History Main Topics  . Smoking status: Never Smoker   . Smokeless tobacco: Never Used  . Alcohol Use: No  . Drug Use: No  . Sexually Active: None   Other Topics Concern  . None   Social History Narrative  . None   History reviewed. No pertinent past surgical history. Past Medical History  Diagnosis Date  .  Seizures   . Stroke   . Diabetes mellitus   . Hypertension    BP 132/85  Pulse 84  Resp 16  Ht 5\' 3"  (1.6 m)  Wt 250 lb (113.399 kg)  BMI 44.3 kg/m2  SpO2 99%      Review of Systems  All other systems reviewed and are negative.       Objective:   Physical Exam General: Alert and oriented x 3, No apparent distress  HEENT: Head is normocephalic, atraumatic, PERRLA, EOMI, sclera anicteric, oral mucosa pink and moist, dentition intact, ext ear canals clear,  Neck: Supple without JVD or lymphadenopathy  Heart: Reg rate and rhythm. No murmurs rubs or gallops  Chest: CTA bilaterally without wheezes, rales, or rhonchi; no distress  Abdomen: Soft, non-tender, non-distended, bowel sounds positive.  Extremities: No clubbing, cyanosis, or edema. Pulses are 2+  Skin: Clean and intact without signs of breakdown  Neuro: fair insight and awareness. Oriented x 3. Follows all commands. Left upper ist 1/5 at pec major with 1 1+ tone noted. LLE is 1-2/5 left hip and knee. 0/5 at left ankle with equinovarus position noted. Tone is 4 at ankle. Heel cord is very tight.  Sensation is diminished but she still senses pain on the left. DTR's are 3+ on left.  Speech is generally clear. She has a mild left central 7.  Musculoskeletal: she has a mild subluxation of .25 inches at left shoulder. Posture is fair.  Psych: Pt's affect is appropriate. Pt is cooperative and in good spirits.  Assessment & Plan:   Assessment:  1.Remote rght MCA infarct  2. Positive Lupus coagulant- on coumadin  3. Myofascial pain  4. Spastic left hemiparesis due to #1    Plan:  1. Continue with baclofen to 10mg  tid. 2. Will follow up with my office manager regarding botox aproval--would like to pursue injections to the left calf, tibialis posterior 3. Kimberly [name] has seen by PT for a formal wheelchair assessment and specification recommendations. I concur with their assessment and recommendations. She doesn't have sufficient  upper extremity function or strength to propel a standard chair. Kimberly [name] is appropriate for a powered wheelchair given her profound left hemiparesis and spasticity. She is competent and motivated to use a powered wheel chair within her home. A powered wheel chair would allow her to be more independent and safe with self care and mobility within her home 4. For now, we will set up 2 month follow up. 30 minutes of face to face patient care time were spent during this visit. All questions were encouraged and answered.

## 2013-01-21 ENCOUNTER — Encounter: Payer: BC Managed Care – PPO | Admitting: Physical Medicine & Rehabilitation

## 2013-01-28 ENCOUNTER — Encounter: Payer: Self-pay | Admitting: Physical Medicine & Rehabilitation

## 2013-01-28 ENCOUNTER — Encounter
Payer: BC Managed Care – PPO | Attending: Physical Medicine and Rehabilitation | Admitting: Physical Medicine & Rehabilitation

## 2013-01-28 VITALS — BP 139/83 | HR 87 | Resp 14 | Ht 63.5 in | Wt 250.0 lb

## 2013-01-28 DIAGNOSIS — IMO0001 Reserved for inherently not codable concepts without codable children: Secondary | ICD-10-CM | POA: Insufficient documentation

## 2013-01-28 DIAGNOSIS — G8110 Spastic hemiplegia affecting unspecified side: Secondary | ICD-10-CM | POA: Insufficient documentation

## 2013-01-28 NOTE — Progress Notes (Signed)
Botox Injection for spasticity using needle EMG guidance  Dilution: 100 Units/ml Indication: Severe spasticity which interferes with ADL,mobility and/or  hygiene and is unresponsive to medication management and other conservative care Informed consent was obtained after describing risks and benefits of the procedure with the patient. This includes bleeding, bruising, infection, excessive weakness, or medication side effects. A REMS form is on file and signed. Needle: 50mm injectable needle electrode Number of units per muscle  Gastrosoleus 100u Tibialis posterior 100 All injections were done after obtaining appropriate EMG activity and after negative drawback for blood. The patient tolerated the procedure well. Post procedure instructions were given. A followup appointment was made.   Discussed appropriate ROM

## 2013-01-28 NOTE — Patient Instructions (Signed)
CALL ME WITH ANY PROBLEMS OR QUESTIONS (#[phone]).  HAVE A GOOD DAY  

## 2013-03-16 ENCOUNTER — Emergency Department (HOSPITAL_BASED_OUTPATIENT_CLINIC_OR_DEPARTMENT_OTHER)
Admission: EM | Admit: 2013-03-16 | Discharge: 2013-03-16 | Disposition: A | Discharge status: Home / Self Care | Payer: BC Managed Care – PPO | Attending: Emergency Medicine | Admitting: Emergency Medicine

## 2013-03-16 ENCOUNTER — Emergency Department (HOSPITAL_BASED_OUTPATIENT_CLINIC_OR_DEPARTMENT_OTHER): Payer: BC Managed Care – PPO

## 2013-03-16 DIAGNOSIS — Z8673 Personal history of transient ischemic attack (TIA), and cerebral infarction without residual deficits: Secondary | ICD-10-CM | POA: Insufficient documentation

## 2013-03-16 DIAGNOSIS — I10 Essential (primary) hypertension: Secondary | ICD-10-CM | POA: Insufficient documentation

## 2013-03-16 DIAGNOSIS — R51 Headache: Secondary | ICD-10-CM | POA: Insufficient documentation

## 2013-03-16 DIAGNOSIS — E119 Type 2 diabetes mellitus without complications: Secondary | ICD-10-CM | POA: Insufficient documentation

## 2013-03-16 DIAGNOSIS — Z794 Long term (current) use of insulin: Secondary | ICD-10-CM | POA: Insufficient documentation

## 2013-03-16 DIAGNOSIS — G40909 Epilepsy, unspecified, not intractable, without status epilepticus: Secondary | ICD-10-CM | POA: Insufficient documentation

## 2013-03-16 DIAGNOSIS — R569 Unspecified convulsions: Secondary | ICD-10-CM

## 2013-03-16 DIAGNOSIS — Z79899 Other long term (current) drug therapy: Secondary | ICD-10-CM | POA: Insufficient documentation

## 2013-03-16 LAB — COMPREHENSIVE METABOLIC PANEL
Alkaline Phosphatase: 46 U/L (ref 39–117)
BUN: 9 mg/dL (ref 6–23)
CO2: 27 mEq/L (ref 19–32)
Chloride: 102 mEq/L (ref 96–112)
GFR calc Af Amer: >90 mL/min (ref >90)
GFR calc non Af Amer: >90 mL/min (ref >90)
Glucose, Bld: 211 mg/dL — ABNORMAL HIGH (ref 70–99)
Potassium: 3.7 mEq/L (ref 3.5–5.1)
Total Bilirubin: 0.4 mg/dL (ref 0.3–1.2)

## 2013-03-16 LAB — URINALYSIS, ROUTINE W REFLEX MICROSCOPIC
Bilirubin Urine: NEGATIVE
Ketones, ur: NEGATIVE mg/dL
Nitrite: NEGATIVE
Protein, ur: 100 mg/dL — AB
Specific Gravity, Urine: 1.021 (ref 1.005–1.030)
Urobilinogen, UA: 1 mg/dL (ref 0.0–1.0)

## 2013-03-16 LAB — CBC WITH DIFFERENTIAL/PLATELET
Eosinophils Absolute: 0 10*3/uL (ref 0.0–0.7)
Hemoglobin: 15.9 g/dL — ABNORMAL HIGH (ref 12.0–15.0)
Lymphocytes Relative: 39 % (ref 12–46)
Lymphs Abs: 1.6 10*3/uL (ref 0.7–4.0)
MCH: 30.3 pg (ref 26.0–34.0)
Monocytes Relative: 9 % (ref 3–12)
Neutro Abs: 2.1 10*3/uL (ref 1.7–7.7)
Neutrophils Relative %: 50 % (ref 43–77)
RBC: 5.25 MIL/uL — ABNORMAL HIGH (ref 3.87–5.11)

## 2013-03-16 LAB — PROTIME-INR
INR: 3.21 — ABNORMAL HIGH (ref 0.00–1.49)
Prothrombin Time: 31.7 seconds — ABNORMAL HIGH (ref 11.6–15.2)

## 2013-03-16 LAB — URINE MICROSCOPIC-ADD ON

## 2013-03-16 NOTE — ED Provider Notes (Signed)
History    CSN: [id number] Arrival date & time 03/16/13  1321  First MD Initiated Contact with Patient 03/16/13 1325     Chief Complaint  Patient presents with  . Seizures   (Consider location/radiation/quality/duration/timing/severity/associated sxs/prior Treatment) HPI Comments: Patient presents from home after witnessed seizure. History of left-sided plegia and previous seizure disorder. Witnessed by daughter. Seizure activity lasted about 5 minutes and resolves spontaneously. Did not appear to have a postictal period. No tongue biting or incontinence. States compliance with her contacts and Neurontin. No recent medication changes. No recent illnesses. No fevers, chills, cough, nausea vomiting. She felt fine this morning. She felt tingling in her left arm which is her seizures usually start in it that progressed to full-body shaking. She is back to baseline now. No new weakness, numbness or tingling. Last seizure was November 2012.   The history is provided by the patient and the EMS personnel.   Past Medical History  Diagnosis Date  . Seizures   . Stroke   . Diabetes mellitus   . Hypertension    No past surgical history on file. Family History  Problem Relation Age of Onset  . Cancer Father    History  Substance Use Topics  . Smoking status: Never Smoker   . Smokeless tobacco: Never Used  . Alcohol Use: No   OB History   Grav Para Term Preterm Abortions TAB SAB Ect Mult Living                 Review of Systems  Constitutional: Negative for fever, activity change and appetite change.  HENT: Negative for congestion and rhinorrhea.   Respiratory: Negative for chest tightness and shortness of breath.   Cardiovascular: Negative for chest pain.  Gastrointestinal: Negative for nausea, vomiting and abdominal pain.  Genitourinary: Negative for dysuria, hematuria, vaginal bleeding and vaginal discharge.  Musculoskeletal: Negative for back pain.  Skin: Negative for rash.   Neurological: Positive for seizures and headaches. Negative for dizziness.  A complete 10 system review of systems was obtained and all systems are negative except as noted in the HPI and PMH.    Allergies  Review of patient's allergies indicates no known allergies.  Home Medications   Current Outpatient Rx  Name  Route  Sig  Dispense  Refill  . baclofen (LIORESAL) 10 MG tablet   Oral   Take 1 tablet (10 mg total) by mouth 3 (three) times daily.   90 each   3   . cholecalciferol (VITAMIN D) 1000 UNITS tablet   Oral   Take 1,000 Units by mouth daily.         . diclofenac sodium (VOLTAREN) 1 % GEL   Topical   Apply 1 application topically 4 (four) times daily.   2 Tube   2   . diphenhydrAMINE (BENADRYL) 25 MG tablet   Oral   Take 1 tablet (25 mg total) by mouth every 6 (six) hours.   20 tablet   0   . fish oil-omega-3 fatty acids 1000 MG capsule   Oral   Take 1 g by mouth daily.         Marland Kitchen gabapentin (NEURONTIN) 100 MG capsule   Oral   Take 100 mg by mouth 3 (three) times daily.          Marland Kitchen glimepiride (AMARYL) 1 MG tablet   Oral   Take 1 mg by mouth daily before breakfast.         .  insulin aspart (NOVOLOG) 100 UNIT/ML injection   Subcutaneous   Inject 40 Units into the skin 2 (two) times daily with a meal.         . lacosamide (VIMPAT) 200 MG TABS   Oral   Take by mouth 2 (two) times daily.           . metoprolol succinate (TOPROL-XL) 25 MG 24 hr tablet   Oral   Take 25 mg by mouth daily.           . norethindrone (AYGESTIN) 5 MG tablet               . simvastatin (ZOCOR) 20 MG tablet   Oral   Take 20 mg by mouth every evening.         . warfarin (COUMADIN) 10 MG tablet   Oral   Take 10 mg by mouth daily. Monday and Friday 10 mg and all other days 5 mg          BP 137/99  Pulse 99  Temp(Src) 98.6 F (37 C) (Oral)  Resp 16  Ht 5' 3.5" (1.613 m)  Wt 250 lb (113.399 kg)  BMI 43.59 kg/m2  SpO2 98% Physical Exam   Constitutional: She is oriented to person, place, and time. She appears well-developed and well-nourished. No distress.  HENT:  Head: Normocephalic and atraumatic.  Mouth/Throat: Oropharynx is clear and moist. No oropharyngeal exudate.  Eyes: Conjunctivae and EOM are normal. Pupils are equal, round, and reactive to light.  Neck: Normal range of motion. Neck supple.  No C spine tendnerness  Cardiovascular: Normal rate, regular rhythm and normal heart sounds.   No murmur heard. Pulmonary/Chest: Effort normal and breath sounds normal. No respiratory distress.  Abdominal: Soft. There is no tenderness. There is no rebound and no guarding.  Musculoskeletal: Normal range of motion. She exhibits no edema and no tenderness.  No CVAT  Neurological: She is alert and oriented to person, place, and time. No cranial nerve deficit. She exhibits normal muscle tone. Coordination normal.  L sided weakness at baseline  Skin: Skin is warm.    ED Course  Procedures (including critical care time) Labs Reviewed  CBC WITH DIFFERENTIAL - Abnormal; Notable for the following:    RBC 5.25 (*)    Hemoglobin 15.9 (*)    All other components within normal limits  COMPREHENSIVE METABOLIC PANEL - Abnormal; Notable for the following:    Glucose, Bld 211 (*)    All other components within normal limits  PROTIME-INR - Abnormal; Notable for the following:    Prothrombin Time 31.7 (*)    INR 3.21 (*)    All other components within normal limits  URINALYSIS, ROUTINE W REFLEX MICROSCOPIC - Abnormal; Notable for the following:    APPearance CLOUDY (*)    Hgb urine dipstick MODERATE (*)    Protein, ur 100 (*)    All other components within normal limits  URINE MICROSCOPIC-ADD ON - Abnormal; Notable for the following:    Squamous Epithelial / LPF FEW (*)    All other components within normal limits   Ct Head Wo Contrast  03/16/2013   *RADIOLOGY REPORT*  Clinical Data: Seizure for 5-7 minutes, past history of  seizure, stroke, diabetes, hypertension  CT HEAD WITHOUT CONTRAST  Technique:  Contiguous axial images were obtained from the base of the skull through the vertex without contrast.  Comparison: 07/16/2011  Findings: Stable ventricular morphology with mild ex vacuo dilatation of right lateral ventricle and  mild left-to-right midline shift. Old right MCA territory infarct with encephalomalacia at the right temporal, right parietal and portion of the right frontal lobes as well as the right basal ganglia. No intracranial hemorrhage, mass lesion or evidence of acute infarction. No extra-axial fluid collections. Posterior fossa unremarkable. No acute bone or sinus abnormalities.  IMPRESSION: Old right MCA territory infarct unchanged from previous exam. No acute intracranial abnormalities.   Original Report Authenticated By: Ulyses Southward, M.D.   1. Seizure     MDM  Witnessed seizure with known seizure disorder. Now back to baseline. Left-sided weakness at baseline. States compliance with seizure medications. Patient states her dentist told her she had an infected tooth last week but did not put her on antibiotics. She was on antibiotics 2 weeks ago for a toenail infection.  CT head negative. INR elevated at 3.2 Antiepileptics discussed with Dr. Leroy Kennedy of neurology. He recommends no changes that she is on her maximum vimpat dose and has been seizure free for 1.5 years.   Family and patient confirms she is at her baseline. Advised to hold Coumadin dose tonight. Continue seizure medications.  Glynn Octave, MD 03/16/13 1530

## 2013-03-16 NOTE — ED Notes (Signed)
Report from EMS was the Pt. Daughter said the seizure started at 11:45 am today and the Pt. Daughter called EMS at 12:17 with reports from the Pt. Daughter that the Pt. Had an approx. 5-7 min seizure full body per Pt. Daughter.  Pt. Was not postictal at all.  Pt. Has slight movement to the L leg and no movement to L arm due to past history of Stroke.

## 2013-03-16 NOTE — ED Notes (Signed)
CBG was 168  With EMS

## 2013-03-16 NOTE — ED Notes (Signed)
Rebeeped Neurology via [facility]--spoke with Gala Romney

## 2013-03-27 ENCOUNTER — Other Ambulatory Visit: Payer: Self-pay | Admitting: Physical Medicine & Rehabilitation

## 2013-04-01 ENCOUNTER — Encounter: Payer: Self-pay | Admitting: Physical Medicine & Rehabilitation

## 2013-04-01 ENCOUNTER — Encounter
Payer: BC Managed Care – PPO | Attending: Physical Medicine and Rehabilitation | Admitting: Physical Medicine & Rehabilitation

## 2013-04-01 VITALS — BP 133/75 | HR 74 | Resp 14 | Ht 63.5 in | Wt 250.0 lb

## 2013-04-01 DIAGNOSIS — G8110 Spastic hemiplegia affecting unspecified side: Secondary | ICD-10-CM

## 2013-04-01 DIAGNOSIS — I639 Cerebral infarction, unspecified: Secondary | ICD-10-CM

## 2013-04-01 DIAGNOSIS — IMO0001 Reserved for inherently not codable concepts without codable children: Secondary | ICD-10-CM | POA: Insufficient documentation

## 2013-04-01 DIAGNOSIS — I6350 Cerebral infarction due to unspecified occlusion or stenosis of unspecified cerebral artery: Secondary | ICD-10-CM

## 2013-04-01 MED ORDER — GLIMEPIRIDE 1 MG PO TABS: 1.0000 mg | ORAL_TABLET | Freq: Every day | ORAL | Status: DC

## 2013-04-01 NOTE — Progress Notes (Signed)
Subjective:    Patient ID: Kimberly [name], female    DOB: 22-Jun-1961, 52 y.o.   MRN: [id number]  HPI  Kimberly [name] is back regarding her spastic left hemiparesis. 2 months ago we performed botox to her left gastroc and tibialis posterior. She does do some stretching at home, but she is not doing it daily. Her husband helps at times.  she has a JAS splint but is not using it daily. She and her husband feel that the botox was useful but she continues to have tone in the leg.   Her left shoulder remains weak although the pain is under good control at present.   She has a WHO but it has broken. She does not own a PRAFO.   Pain Inventory Average Pain 0 Pain Right Now 0 My pain is no pain  In the last 24 hours, has pain interfered with the following? General activity 0 Relation with others 0 Enjoyment of life 0 What TIME of day is your pain at its worst? no pain Sleep (in general) Good  Pain is worse with: no pain Pain improves with: no pain Relief from Meds: no pain  Mobility use a cane use a walker use a wheelchair  Function disabled: date disabled . I need assistance with the following:  dressing and shopping  Neuro/Psych No problems in this area  Prior Studies Any changes since last visit?  no  Physicians involved in your care Any changes since last visit?  no   Family History  Problem Relation Age of Onset  . Cancer Father    History   Social History  . Marital Status: Married    Spouse Name: N/A    Number of Children: N/A  . Years of Education: N/A   Social History Main Topics  . Smoking status: Never Smoker   . Smokeless tobacco: Never Used  . Alcohol Use: No  . Drug Use: No  . Sexually Active: None   Other Topics Concern  . None   Social History Narrative  . None   History reviewed. No pertinent past surgical history. Past Medical History  Diagnosis Date  . Seizures   . Stroke   . Diabetes mellitus   . Hypertension    BP 133/75  Pulse 74   Resp 14  Ht 5' 3.5" (1.613 m)  Wt 250 lb (113.399 kg)  BMI 43.59 kg/m2  SpO2 96%   Review of Systems  All other systems reviewed and are negative.       Objective:   Physical Exam  General: Alert and oriented x 3, No apparent distress  HEENT: Head is normocephalic, atraumatic, PERRLA, EOMI, sclera anicteric, oral mucosa pink and moist, dentition intact, ext ear canals clear,  Neck: Supple without JVD or lymphadenopathy  Heart: Reg rate and rhythm. No murmurs rubs or gallops  Chest: CTA bilaterally without wheezes, rales, or rhonchi; no distress  Abdomen: Soft, non-tender, non-distended, bowel sounds positive.  Extremities: No clubbing, cyanosis, or edema. Pulses are 2+  Skin: Clean and intact without signs of breakdown  Neuro: fair insight and awareness. Oriented x 3. Follows all commands. Left upper ist 1/5 at pec major with 1 1+ tone noted. LLE is 1-2/5 left hip and knee. 0/5 at left ankle with equinovarus position noted. Tone is 4 at ankle. Sensation is diminished but she still senses pain on the left. DTR's are 3+ on left. Speech is generally clear. She has a mild left central 7.  Musculoskeletal: she has a  mild subluxation of .25 inches at left shoulder. Posture is fair.  Psych: Pt's affect is appropriate. Pt is cooperative    Assessment & Plan:   Assessment:  1.Remote rght MCA infarct  2.Positive Lupus coagulant- on coumadin  3. Myofascial pain  4. Spastic left hemiparesis due to #1    Plan:  1. Continue baclofen 10mg  , but increase to TID for treat her left sided spasticity with a more broad stroke. 2. Will Set up again with Botox to the left gastroc and tibialis posterior, perhap tibialis anterior, will use 400u. We will do these next month.  3. Will order her a PRAFO and replacement left WHO to be used at night per [facility]. 4. She needs to be much more aggressive with her stretching program. 30-54minutes given her dense hp and spasticity. Pt and husband agree to do a  beter job in this regard.   30 minutes were spent with the patient today.

## 2013-04-01 NOTE — Patient Instructions (Signed)
YOU NEED TO WORK 30 TO 60 MINUTES PER DAY ON YOUR STRETCHING!!!!

## 2013-05-06 ENCOUNTER — Encounter
Payer: BC Managed Care – PPO | Attending: Physical Medicine and Rehabilitation | Admitting: Physical Medicine & Rehabilitation

## 2013-05-06 ENCOUNTER — Encounter: Payer: Self-pay | Admitting: Physical Medicine & Rehabilitation

## 2013-05-06 VITALS — BP 136/76 | HR 81 | Resp 14 | Ht 63.0 in | Wt 250.0 lb

## 2013-05-06 DIAGNOSIS — G8110 Spastic hemiplegia affecting unspecified side: Secondary | ICD-10-CM | POA: Insufficient documentation

## 2013-05-06 DIAGNOSIS — IMO0001 Reserved for inherently not codable concepts without codable children: Secondary | ICD-10-CM | POA: Insufficient documentation

## 2013-05-06 DIAGNOSIS — I639 Cerebral infarction, unspecified: Secondary | ICD-10-CM

## 2013-05-06 DIAGNOSIS — M6240 Contracture of muscle, unspecified site: Secondary | ICD-10-CM

## 2013-05-06 DIAGNOSIS — M6702 Short Achilles tendon (acquired), left ankle: Secondary | ICD-10-CM

## 2013-05-06 DIAGNOSIS — I6350 Cerebral infarction due to unspecified occlusion or stenosis of unspecified cerebral artery: Secondary | ICD-10-CM

## 2013-05-06 NOTE — Progress Notes (Signed)
Botox Injection for spasticity using needle EMG guidance  Dilution: 100 Units/ml Indication: Severe spasticity which interferes with ADL,mobility and/or  hygiene and is unresponsive to medication management and other conservative care Informed consent was obtained after describing risks and benefits of the procedure with the patient. This includes bleeding, bruising, infection, excessive weakness, or medication side effects. A REMS form is on file and signed. Needle: 25g, 1.5 inch needle  Number of units per muscle  Gastrosoleus/soleus 200 units in 4 separate access points Tibialis Posterior: 200 units in 4 separate access points All injections were done   after negative drawback for blood. The patient tolerated the procedure well. Post procedure instructions were given. A followup appointment was made.

## 2013-05-06 NOTE — Patient Instructions (Signed)
CALL ME WITH ANY PROBLEMS OR QUESTIONS (#[phone]).  HAVE A GOOD DAY  

## 2013-06-20 ENCOUNTER — Encounter (HOSPITAL_BASED_OUTPATIENT_CLINIC_OR_DEPARTMENT_OTHER): Payer: Self-pay | Admitting: Emergency Medicine

## 2013-06-20 ENCOUNTER — Emergency Department (HOSPITAL_BASED_OUTPATIENT_CLINIC_OR_DEPARTMENT_OTHER)
Admission: EM | Admit: 2013-06-20 | Discharge: 2013-06-20 | Disposition: A | Discharge status: Home / Self Care | Payer: BC Managed Care – PPO | Attending: Emergency Medicine | Admitting: Emergency Medicine

## 2013-06-20 DIAGNOSIS — Z7901 Long term (current) use of anticoagulants: Secondary | ICD-10-CM | POA: Insufficient documentation

## 2013-06-20 DIAGNOSIS — Z794 Long term (current) use of insulin: Secondary | ICD-10-CM | POA: Insufficient documentation

## 2013-06-20 DIAGNOSIS — R51 Headache: Secondary | ICD-10-CM | POA: Insufficient documentation

## 2013-06-20 DIAGNOSIS — Z79899 Other long term (current) drug therapy: Secondary | ICD-10-CM | POA: Insufficient documentation

## 2013-06-20 DIAGNOSIS — I10 Essential (primary) hypertension: Secondary | ICD-10-CM | POA: Insufficient documentation

## 2013-06-20 DIAGNOSIS — R5381 Other malaise: Secondary | ICD-10-CM | POA: Insufficient documentation

## 2013-06-20 DIAGNOSIS — E119 Type 2 diabetes mellitus without complications: Secondary | ICD-10-CM | POA: Insufficient documentation

## 2013-06-20 DIAGNOSIS — G40209 Localization-related (focal) (partial) symptomatic epilepsy and epileptic syndromes with complex partial seizures, not intractable, without status epilepticus: Secondary | ICD-10-CM

## 2013-06-20 DIAGNOSIS — Z8673 Personal history of transient ischemic attack (TIA), and cerebral infarction without residual deficits: Secondary | ICD-10-CM | POA: Insufficient documentation

## 2013-06-20 DIAGNOSIS — R5383 Other fatigue: Secondary | ICD-10-CM | POA: Insufficient documentation

## 2013-06-20 LAB — GLUCOSE, CAPILLARY

## 2013-06-20 NOTE — Discharge Instructions (Signed)
Epilepsy A seizure (convulsion) is a sudden change in brain function that causes a change in behavior, muscle activity, or ability to remain awake and alert. If a person has recurring seizures, this is called epilepsy. CAUSES  Epilepsy is a disorder with many possible causes. Anything that disturbs the normal pattern of brain cell activity can lead to seizures. Seizure can be caused from illness to brain damage to abnormal brain development. Epilepsy may develop because of:  An abnormality in brain wiring.  An imbalance of nerve signaling chemicals (neurotransmitters).  Some combination of these factors. Scientists are learning an increasing amount about genetic causes of seizures. SYMPTOMS  The symptoms of a seizure can vary greatly from one person to another. These may include:  An aura, or warning that tells a person they are about to have a seizure.  Abnormal sensations, such as abnormal smell or seeing flashing lights.  Sudden, general body stiffness.  Rhythmic jerking of the face, arm, or leg  on one or both sides.  Sudden change in consciousness.  The person may appear to be awake but not responding.  They may appear to be asleep but cannot be awakened.  Grimacing, chewing, lip smacking, or drooling.  Often there is a period of sleepiness after a seizure. DIAGNOSIS  The description you give to your caregiver about what you experienced will help them understand your problems. Equally important is the description by any witnesses to your seizure. A physical exam, including a detailed neurological exam, is necessary. An EEG (electroencephalogram) is a painless test of your brain waves. In this test a diagram is created of your brain waves. These diagrams can be interpreted by a specialist. Pictures of your brain are usually taken with:  An MRI.  A CT scan. Lab tests may be done to look for:  Signs of infection.  Abnormal blood chemistry. PREVENTION  There is no way to  prevent the development of epilepsy. If you have seizures that are typically triggered by an event (such as flashing lights), try to avoid the trigger. This can help you avoid a seizure.  PROGNOSIS  Most people with epilepsy lead outwardly normal lives. While epilepsy cannot currently be cured, for some people it does eventually go away. Most seizures do not cause brain damage. It is not uncommon for people with epilepsy, especially children, to develop behavioral and emotional problems. These problems are sometimes the consequence of medicine for seizures or social stress. For some people with epilepsy, the risk of seizures restricts their independence and recreational activities. For example, some states refuse drivers licenses to people with epilepsy. Most women with epilepsy can become pregnant. They should discuss their epilepsy and the medicine they are taking with their caregivers. Women with epilepsy have a 90 percent or better chance of having a normal, healthy baby. RISKS AND COMPLICATIONS  People with epilepsy are at increased risk of falls, accidents, and injuries. People with epilepsy are at special risk for two life-threatening conditions. These are status epilepticus and sudden unexplained death (extremely rare). Status epilepticus is a long lasting, continuous seizure that is a medical emergency. TREATMENT  Once epilepsy is diagnosed, it is important to begin treatment as soon as possible. For about 80 percent of those diagnosed with epilepsy, seizures can be controlled with modern medicines and surgical techniques. Some antiepileptic drugs can interfere with the effectiveness of oral contraceptives. In 1997, the FDA approved a pacemaker for the brain the (vagus nerve stimulator). This stimulator can be used for   people with seizures that are not well-controlled by medicine. Studies have shown that in some cases, children may experience fewer seizures if they maintain a strict diet. The strict  diet is called the ketogenic diet. This diet is rich in fats and low in carbohydrates. HOME CARE INSTRUCTIONS   Your caregiver will make recommendations about driving and safety in normal activities. Follow these carefully.  Take any medicine prescribed exactly as directed.  Do any blood tests requested to monitor the levels of your medicine.  The people you live and work with should know that you are prone to seizures. They should receive instructions on how to help you. In general, a witness to a seizure should:  Cushion your head and body.  Turn you on your side.  Avoid unnecessarily restraining you.  Not place anything inside your mouth.  Call for local emergency medical help if there is any question about what has occurred.  Keep a seizure diary. Record what you recall about any seizure, especially any possible trigger.  If your caregiver has given you a follow-up appointment, it is very important to keep that appointment. Not keeping the appointment could result in permanent injury and disability. If there is any problem keeping the appointment, you must call back to this facility for assistance. SEEK MEDICAL CARE IF:   You develop signs of infection or other illness. This might increase the risk of a seizure.  You seem to be having more frequent seizures.  Your seizure pattern is changing. SEEK IMMEDIATE MEDICAL CARE IF:   A seizure does not stop after a few moments.  A seizure causes any difficulty in breathing.  A seizure results in a very severe headache.  A seizure leaves you with the inability to speak or use a part of your body. MAKE SURE YOU:   Understand these instructions.  Will watch your condition.  Will get help right away if you are not doing well or get worse. Document Released: 08/13/2005 Document Revised: 11/05/2011 Document Reviewed: 03/19/2008 [employer] Patient Information 2014 [employer].  

## 2013-06-20 NOTE — ED Notes (Signed)
EMS transport from home. Had witnessed seizure, no incontience, cao x 4. Left side weakness from past stroke

## 2013-06-20 NOTE — ED Provider Notes (Signed)
CSN: [id number]     Arrival date & time 06/20/13  1700 History  This chart was scribed for Kimberly [name]. Oletta Lamas, MD by Danella Maiers, ED Scribe. This patient was seen in room MH07/MH07 and the patient's care was started at 6:06 PM.   Chief Complaint  Patient presents with  . Seizures   The history is provided by the patient. No language interpreter was used.   HPI Comments: Carmilla Granville is a 52 y.o. female with a h/o complex partial seizures and stroke brought in by ambulance from home who presents to the Emergency Department complaining of seizure that lasted about one and a half minutes. She states she was watching TV when she felt "electricity" in her hand and then was unable to move. In prior seizures, she reports having the same aura. She states in prior episodes of seizures, she has been unconscious with shaking. Spouse reports when he walked in she was awake, conscious, no abnormal movements. She denies confusion before or after the episode. She states she has returned to baseline except for a mild headache. She takes vimpat and gabapentin for prevention. She has a h/o right brain stroke with resulting left-side weakness.  Past Medical History  Diagnosis Date  . Seizures   . Stroke   . Diabetes mellitus   . Hypertension    Past Surgical History  Procedure Laterality Date  . Tracheostomy closure     Family History  Problem Relation Age of Onset  . Cancer Father    History  Substance Use Topics  . Smoking status: Never Smoker   . Smokeless tobacco: Never Used  . Alcohol Use: No   OB History   Grav Para Term Preterm Abortions TAB SAB Ect Mult Living                 Review of Systems  Neurological: Positive for seizures, weakness (left side baseline from stroke) and headaches. Negative for tremors and syncope.  All other systems reviewed and are negative.    Allergies  Review of patient's allergies indicates no known allergies.  Home Medications   Current Outpatient Rx   Name  Route  Sig  Dispense  Refill  . baclofen (LIORESAL) 10 MG tablet      TAKE 1 TABLET (10 MG TOTAL) BY MOUTH 3 (THREE) TIMES DAILY.   90 tablet   3   . butalbital-acetaminophen-caffeine (FIORICET, ESGIC) 50-325-40 MG per tablet   Oral   Take 1 tablet by mouth 2 (two) times daily as needed for headache.         . Canagliflozin (INVOKANA) 300 MG TABS   Oral   Take 1 tablet by mouth daily.         . cholecalciferol (VITAMIN D) 1000 UNITS tablet   Oral   Take 1,000 Units by mouth daily.         . diclofenac sodium (VOLTAREN) 1 % GEL   Topical   Apply 1 application topically 4 (four) times daily.   2 Tube   2   . diphenhydrAMINE (BENADRYL) 25 MG tablet   Oral   Take 1 tablet (25 mg total) by mouth every 6 (six) hours.   20 tablet   0   . fish oil-omega-3 fatty acids 1000 MG capsule   Oral   Take 1 g by mouth 3 (three) times daily.          Marland Kitchen gabapentin (NEURONTIN) 100 MG capsule   Oral   Take 400 mg  by mouth 3 (three) times daily.          Marland Kitchen glimepiride (AMARYL) 1 MG tablet   Oral   Take 1 tablet (1 mg total) by mouth daily before breakfast.   30 tablet   5   . hydrocortisone cream 0.5 %   Topical   Apply topically as needed.         . insulin aspart (NOVOLOG) 100 UNIT/ML injection   Subcutaneous   Inject 40 Units into the skin 2 (two) times daily with a meal.         . lacosamide (VIMPAT) 200 MG TABS   Oral   Take by mouth 2 (two) times daily.           . metoprolol succinate (TOPROL-XL) 25 MG 24 hr tablet   Oral   Take 25 mg by mouth daily.           . norethindrone (AYGESTIN) 5 MG tablet      10 mg daily.          . simvastatin (ZOCOR) 20 MG tablet   Oral   Take 40 mg by mouth every evening.          . warfarin (COUMADIN) 10 MG tablet   Oral   Take 10 mg by mouth daily. Monday and Friday 10 mg and all other days 5 mg          BP 124/82  Pulse 107  Temp(Src) 97.9 F (36.6 C) (Oral)  Resp 18  Ht 5\' 3"  (1.6 m)   Wt 235 lb (106.595 kg)  BMI 41.64 kg/m2  SpO2 97%  LMP 06/15/2013 Physical Exam  Nursing note and vitals reviewed. Constitutional: She is oriented to person, place, and time. She appears well-developed and well-nourished. No distress.  HENT:  Head: Normocephalic and atraumatic.  Eyes: EOM are normal.  Neck: Neck supple. No tracheal deviation present.  Cardiovascular: Normal rate and regular rhythm.   Pulmonary/Chest: Effort normal. No respiratory distress.  Musculoskeletal: Normal range of motion.  Neurological: She is alert and oriented to person, place, and time.  Right arm strength was normal, LUE and LLE weak at baseline.  Skin: Skin is warm and dry.  Psychiatric: She has a normal mood and affect. Her behavior is normal.    ED Course  Procedures (including critical care time) Medications - No data to display  DIAGNOSTIC STUDIES: Oxygen Saturation is 97% on RA, normal by my interpretation.    COORDINATION OF CARE: 6:23 PM- Discussed treatment plan with pt. Pt agrees to plan.    Labs Review Labs Reviewed  GLUCOSE, CAPILLARY - Abnormal; Notable for the following:    Glucose-Capillary 216 (*)    All other components within normal limits   Imaging Review No results found.  EKG Interpretation   None       MDM   1. Seizure disorder, complex partial    I personally performed the services described in this documentation, which was scribed in my presence. The recorded information has been reviewed and considered.  Pt with h/o seizures, has returned to baseline.  No fever, has been compliant with her medications.  Glucse ok, no fever.  No evidence of status epilepticus, has returned to usual.  Can followup with PCP.  Pt is comfortable going home.  PT inquired about another head CT, does not need as she has returned to baseline and no focal deficits.  I reviewed prior records and pt has had numerous head CTs  over the past few years and unlikely to be changed.   Kimberly [name]. Oletta Lamas, MD 06/25/13 2155

## 2013-07-01 ENCOUNTER — Encounter
Payer: BC Managed Care – PPO | Attending: Physical Medicine and Rehabilitation | Admitting: Physical Medicine & Rehabilitation

## 2013-07-01 ENCOUNTER — Encounter: Payer: Self-pay | Admitting: Physical Medicine & Rehabilitation

## 2013-07-01 VITALS — BP 136/61 | HR 78 | Resp 14 | Ht 63.0 in | Wt 250.0 lb

## 2013-07-01 DIAGNOSIS — I6350 Cerebral infarction due to unspecified occlusion or stenosis of unspecified cerebral artery: Secondary | ICD-10-CM

## 2013-07-01 DIAGNOSIS — G8110 Spastic hemiplegia affecting unspecified side: Secondary | ICD-10-CM | POA: Insufficient documentation

## 2013-07-01 DIAGNOSIS — IMO0001 Reserved for inherently not codable concepts without codable children: Secondary | ICD-10-CM

## 2013-07-01 DIAGNOSIS — M6702 Short Achilles tendon (acquired), left ankle: Secondary | ICD-10-CM

## 2013-07-01 DIAGNOSIS — M6240 Contracture of muscle, unspecified site: Secondary | ICD-10-CM

## 2013-07-01 DIAGNOSIS — I639 Cerebral infarction, unspecified: Secondary | ICD-10-CM

## 2013-07-01 NOTE — Patient Instructions (Signed)
CONTINUE TO BE AGGRESSIVE WITH YOUR RANGE OF MOTION AND STRETCHING PROGRAM.

## 2013-07-01 NOTE — Progress Notes (Signed)
Subjective:    Patient ID: Kimberly [name], female    DOB: 06-12-1961, 52 y.o.   MRN: [id number]  HPI  Kimberly [name] is back regarding her spastic left hemiparesis due to her CVA.  She has had benefit with the botox injection. Her husband notes that her ankle is more flexible. It is easier to get her shoes on do some of her hygiene.  About two weeks ago she had a small partial seizure which only lasted a few seconds. She went to the ED and no changes were made. An EEG wasn't indicated and wasn't done.   Her left shoulder has been feeling better. She is wearing her sling regularly. She is trying to be more aggressive with her stretching regimen at home. She is wearing the WHO and PRAFO at night.  Pain Inventory Average Pain 0 Pain Right Now 0 My pain is no pain  In the last 24 hours, has pain interfered with the following? General activity 0 Relation with others 0 Enjoyment of life 0 What TIME of day is your pain at its worst? no pain Sleep (in general) Good  Pain is worse with: no pain Pain improves with: no pain Relief from Meds: no pain meds  Mobility walk with assistance use a cane ability to climb steps?  no use a wheelchair transfers alone  Function not employed: date last employed na  Neuro/Psych No problems in this area  Prior Studies Any changes since last visit?  no  Physicians involved in your care Any changes since last visit?  no   Family History  Problem Relation Age of Onset  . Cancer Father    History   Social History  . Marital Status: Married    Spouse Name: N/A    Number of Children: N/A  . Years of Education: N/A   Social History Main Topics  . Smoking status: Never Smoker   . Smokeless tobacco: Never Used  . Alcohol Use: No  . Drug Use: No  . Sexual Activity: None   Other Topics Concern  . None   Social History Narrative  . None   Past Surgical History  Procedure Laterality Date  . Tracheostomy closure     Past Medical History   Diagnosis Date  . Seizures   . Stroke   . Diabetes mellitus   . Hypertension    BP 136/61  Pulse 78  Resp 14  Ht 5\' 3"  (1.6 m)  Wt 250 lb (113.399 kg)  BMI 44.30 kg/m2  SpO2 96%  LMP 06/15/2013      Review of Systems  All other systems reviewed and are negative.       Objective:   Physical Exam  General: Alert and oriented x 3, No apparent distress  HEENT: Head is normocephalic, atraumatic, PERRLA, EOMI, sclera anicteric, oral mucosa pink and moist, dentition intact, ext ear canals clear,  Neck: Supple without JVD or lymphadenopathy  Heart: Reg rate and rhythm. No murmurs rubs or gallops  Chest: CTA bilaterally without wheezes, rales, or rhonchi; no distress  Abdomen: Soft, non-tender, non-distended, bowel sounds positive.  Extremities: No clubbing, cyanosis. Pulses are 2+  Skin: Clean and intact without signs of breakdown  Neuro: fair insight and awareness. Oriented x 3. Follows all commands. Left upper ist 1/5 at pec major with 1 1+ tone noted. LLE is 1-2/5 left hip and knee. 0/5 at left ankle with equinovarus position noted. Tone is 3-4 at ankle---I was able to passively stretch her to -10 degrees  neutral position. Left finger/thumbs are 2/4. Marland Kitchen Sensation is diminished but she still senses pain on the left. DTR's are 3+ on left. Speech is generally clear. She has a mild left central 7.  Musculoskeletal: she has a mild subluxation of .25 inches at left shoulder. Left shoulder is less tender overall. She has some edema at the left handPosture is fair.   Psych: Pt's affect is appropriate. Pt is cooperative     Assessment & Plan:   Assessment:  1.Remote rght MCA infarct  2.Positive Lupus coagulant- on coumadin  3. Myofascial pain  4. Spastic left hemiparesis due to #1     Plan:  1. Continue baclofen 10mg  , but increase to TID for treat her left sided spasticity with a more broad stroke. 2. Will Set up again with Botox to the left gastrocs and finger/wrist flexors--  will use 400u. We will do these in 4-6 weekis 3. She has the left  PRAFO and replacement left WHO to be used at night per [facility]. She is wearing these nightly.  4. Continue to encourage stretching and ROM exercises at home.  5. Follow up in 4-6 weeks.  30 minutes were spent with the patient today.

## 2013-07-07 ENCOUNTER — Telehealth: Payer: Self-pay

## 2013-07-07 NOTE — Telephone Encounter (Signed)
Patient called and said while she was doing her stretches on 11/10 as directed her AFO broke. She needs another one.

## 2013-07-08 NOTE — Telephone Encounter (Signed)
Left message advising patient to contact [facility] to have AFO replaced.

## 2013-07-08 NOTE — Telephone Encounter (Signed)
She needs to visit the orthotist ([facility]) for repairs/replacement

## 2013-07-09 ENCOUNTER — Encounter (HOSPITAL_BASED_OUTPATIENT_CLINIC_OR_DEPARTMENT_OTHER): Payer: Self-pay | Admitting: Emergency Medicine

## 2013-07-09 ENCOUNTER — Emergency Department (HOSPITAL_BASED_OUTPATIENT_CLINIC_OR_DEPARTMENT_OTHER)
Admission: EM | Admit: 2013-07-09 | Discharge: 2013-07-09 | Discharge status: Against Medical Advice | Payer: BC Managed Care – PPO | Attending: Emergency Medicine | Admitting: Emergency Medicine

## 2013-07-09 DIAGNOSIS — R000 Tachycardia, unspecified: Secondary | ICD-10-CM | POA: Insufficient documentation

## 2013-07-09 DIAGNOSIS — E119 Type 2 diabetes mellitus without complications: Secondary | ICD-10-CM | POA: Insufficient documentation

## 2013-07-09 DIAGNOSIS — I10 Essential (primary) hypertension: Secondary | ICD-10-CM | POA: Insufficient documentation

## 2013-07-09 NOTE — ED Notes (Signed)
C/o irregular heart rate and sob x 30 min

## 2013-08-08 ENCOUNTER — Other Ambulatory Visit: Payer: Self-pay | Admitting: Physical Medicine & Rehabilitation

## 2013-08-12 ENCOUNTER — Encounter: Payer: Medicare HMO | Admitting: Physical Medicine & Rehabilitation

## 2013-08-12 IMAGING — CT CT HEAD W/O CM
1 series · 16 of 30 positions shown, 20 images · non-contrast
Comparison: 07/16/2011

CLINICAL DATA: Seizure for 5-7 minutes, past history of seizure,
stroke, diabetes, hypertension

CT HEAD WITHOUT CONTRAST
TECHNIQUE: Contiguous axial images were obtained from the base of
the skull through the vertex without contrast.

[Series 2: head 4.8 h37s · axial · 0.47mm/px · z∈[+1014,+1151]mm · 16 of 32 slices shown, 20 images]
[im 2/32  brain]
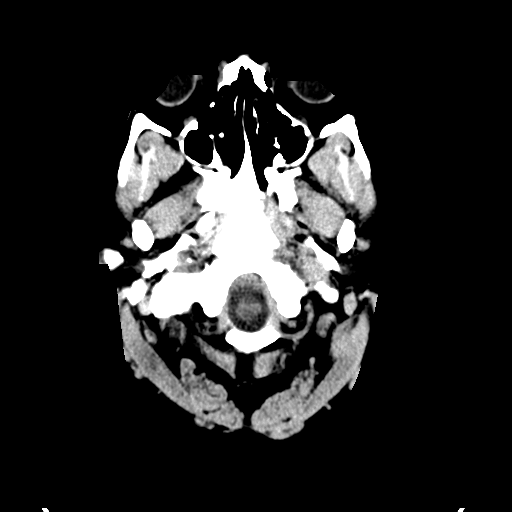
[im 2/32  bone]
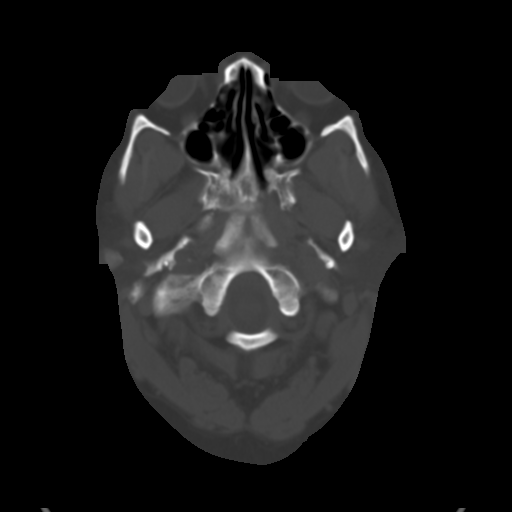
[im 4/32  brain]
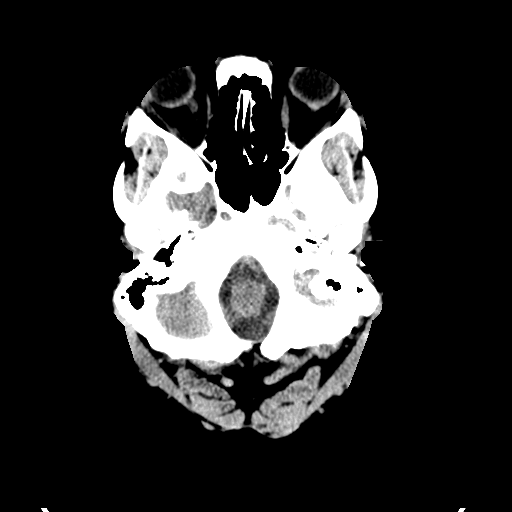
[im 6/32  brain]
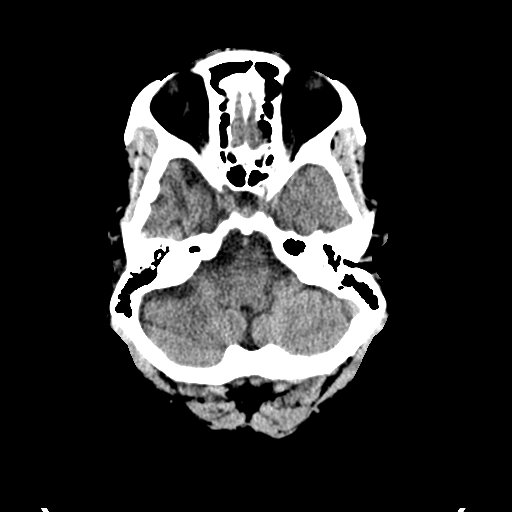
[im 8/32  brain]
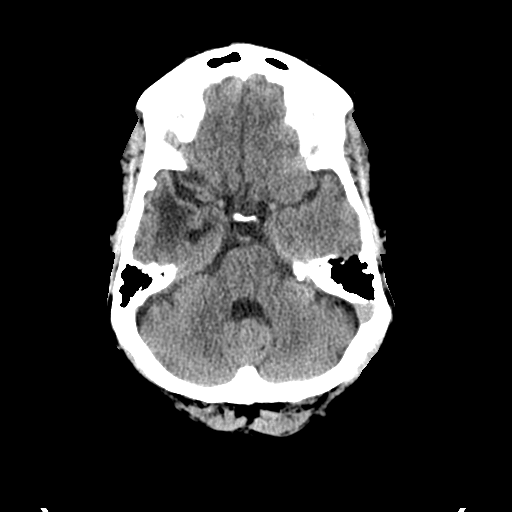
[im 9/32  brain]
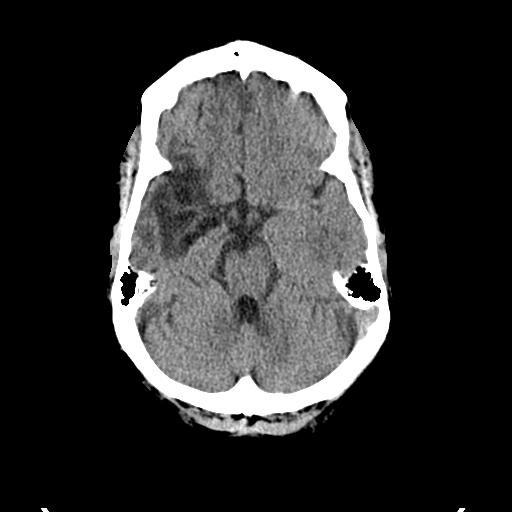
[im 9/32  bone]
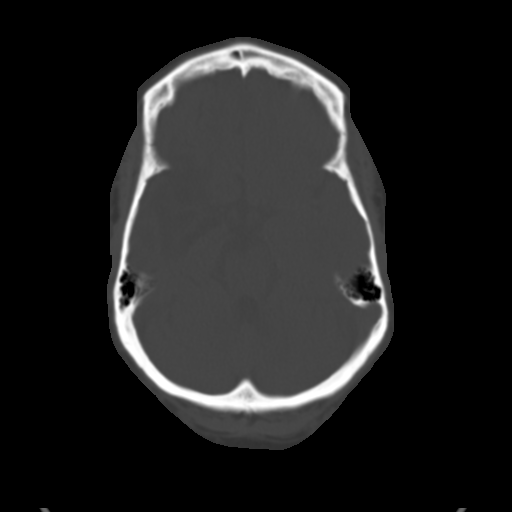
[im 11/32  brain]
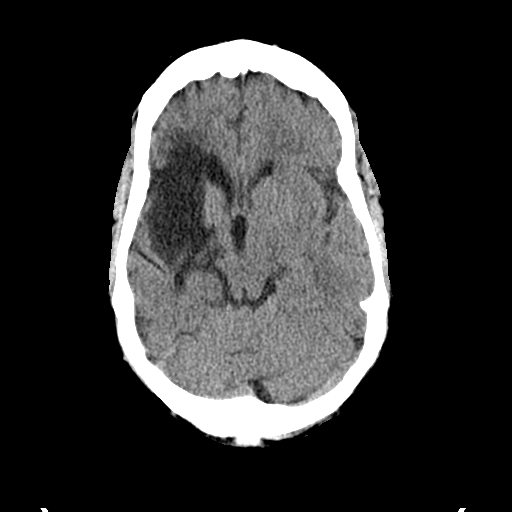
[im 13/32  brain]
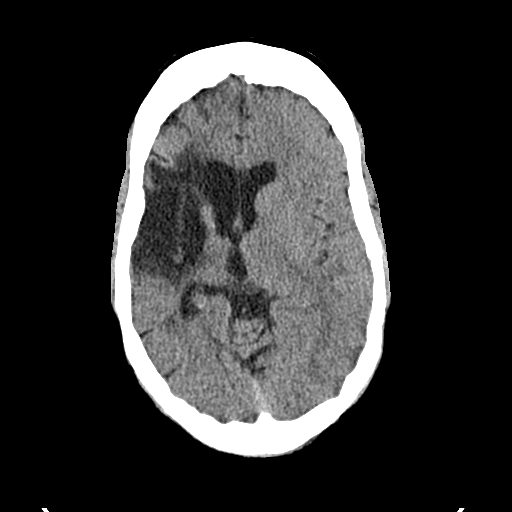
[im 15/32  brain]
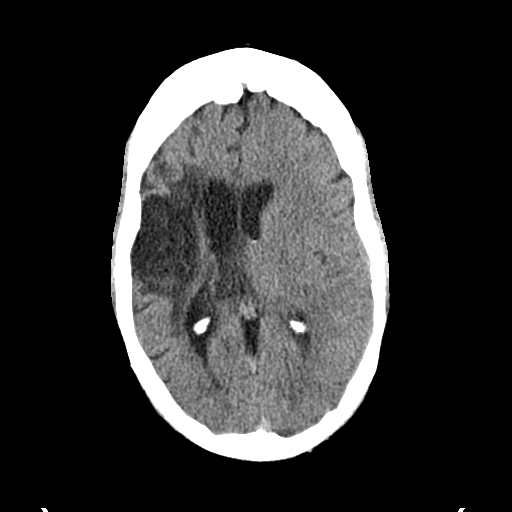
[im 17/32  brain]
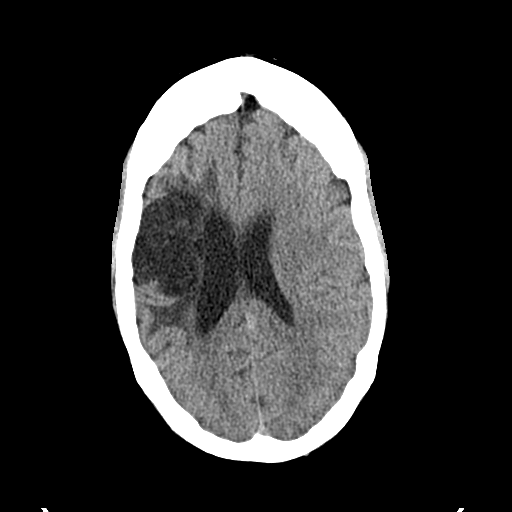
[im 17/32  bone]
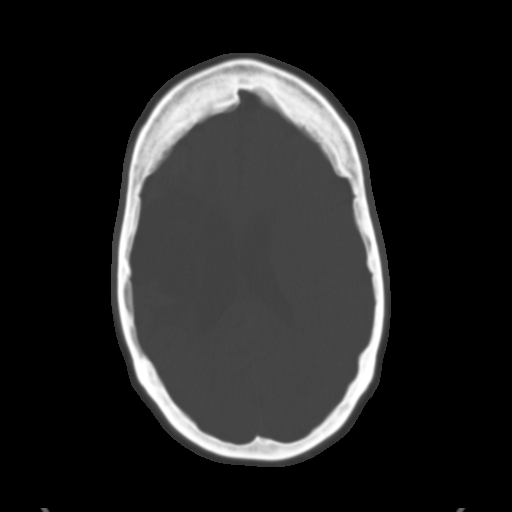
[im 19/32  brain]
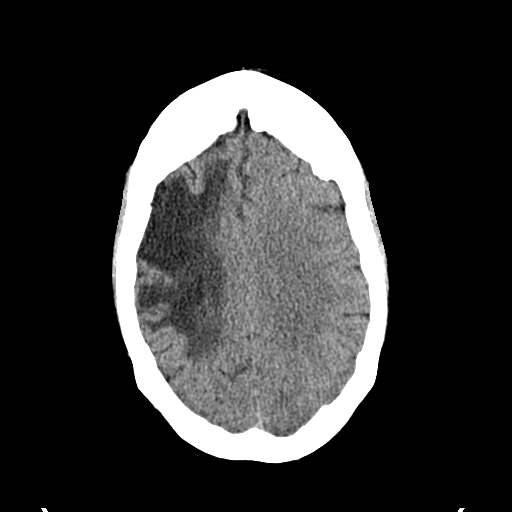
[im 21/32  brain]
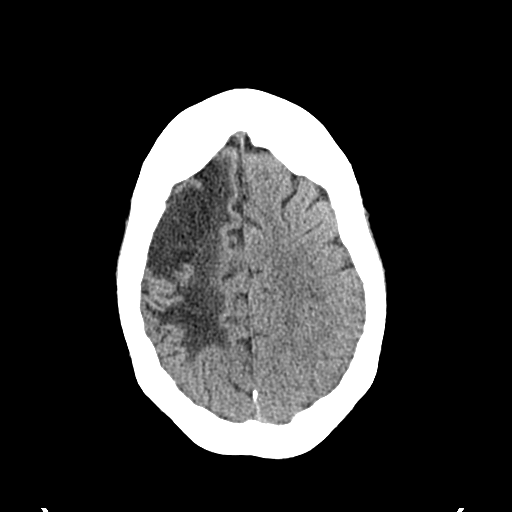
[im 23/32  brain]
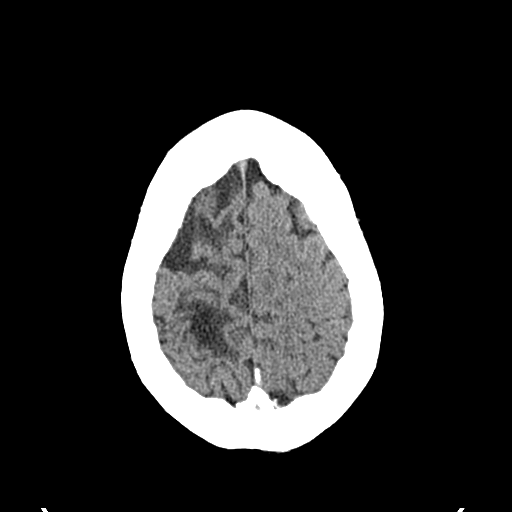
[im 24/32  brain]
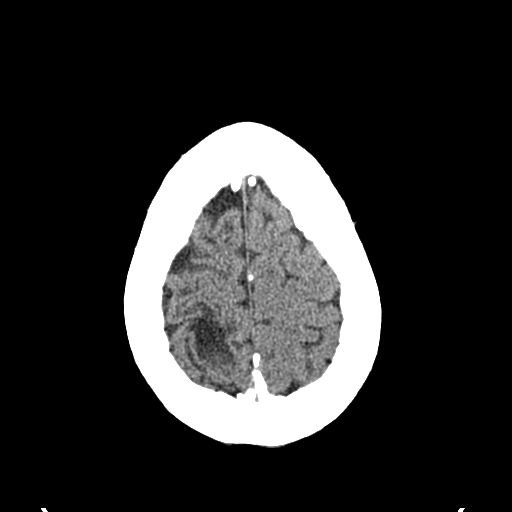
[im 24/32  bone]
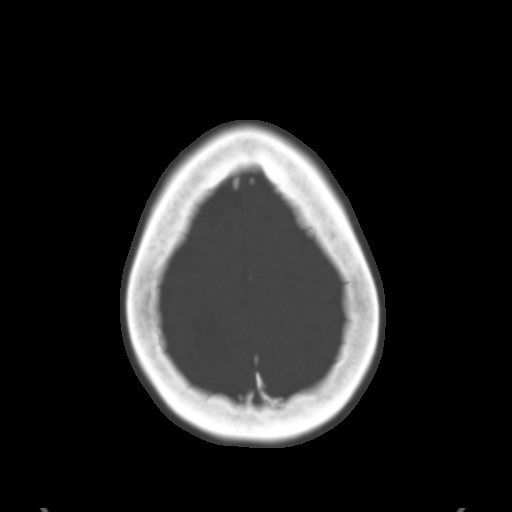
[im 26/32  brain]
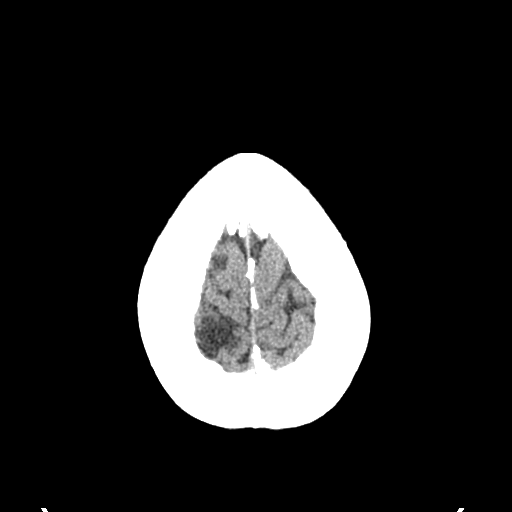
[im 28/32  brain]
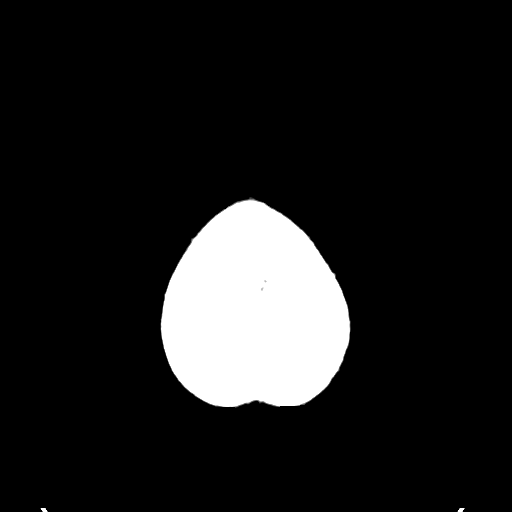
[im 30/32  brain]
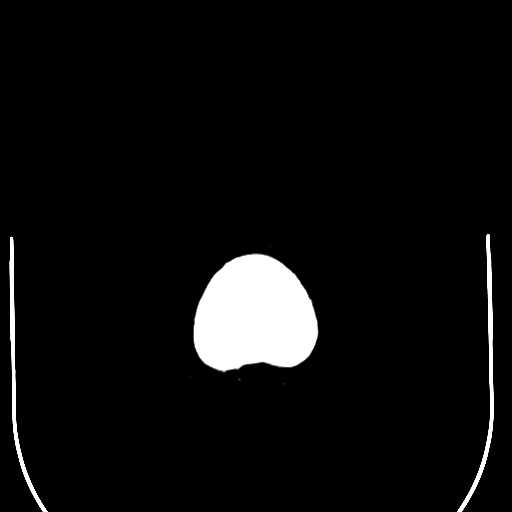

[16 of 30 positions shown; findings below may reference images not displayed]

FINDINGS: Stable ventricular morphology with mild ex vacuo dilatation of
right lateral ventricle and mild left-to-right midline shift.
Old right MCA territory infarct with encephalomalacia at the right
temporal, right parietal and portion of the right frontal lobes as
well as the right basal ganglia.
No intracranial hemorrhage, mass lesion or evidence of acute
infarction.
No extra-axial fluid collections.
Posterior fossa unremarkable.
No acute bone or sinus abnormalities.
IMPRESSION: Old right MCA territory infarct unchanged from previous exam.
No acute intracranial abnormalities.

## 2013-09-16 ENCOUNTER — Encounter: Payer: Self-pay | Admitting: Physical Medicine & Rehabilitation

## 2013-09-16 ENCOUNTER — Encounter
Payer: BC Managed Care – PPO | Attending: Physical Medicine and Rehabilitation | Admitting: Physical Medicine & Rehabilitation

## 2013-09-16 VITALS — BP 123/70 | HR 86 | Resp 14 | Ht 63.0 in | Wt 250.0 lb

## 2013-09-16 DIAGNOSIS — IMO0001 Reserved for inherently not codable concepts without codable children: Secondary | ICD-10-CM | POA: Insufficient documentation

## 2013-09-16 DIAGNOSIS — G8110 Spastic hemiplegia affecting unspecified side: Secondary | ICD-10-CM | POA: Insufficient documentation

## 2013-09-16 NOTE — Patient Instructions (Signed)
PLEASE CALL ME WITH ANY PROBLEMS OR QUESTIONS (#[phone]).      

## 2013-09-16 NOTE — Progress Notes (Signed)
Botox Injection for spasticity using needle EMG guidance Indication: spastic hemiparesis left arm and leg  Dilution: 100 Units/ml        Total Units Injected: 400 Indication: Severe spasticity which interferes with ADL,mobility and/or  hygiene and is unresponsive to medication management and other conservative care Informed consent was obtained after describing risks and benefits of the procedure with the patient. This includes bleeding, bruising, infection, excessive weakness, or medication side effects. A REMS form is on file and signed.  Needle: 27mm injectable monopolar needle electrode  Number of units per muscle   FCR 25 units FCU 75 units FDS 75 units FDP 25 units FPL 0 units Pronator Teres 0 units Pronator Quadratus 0 units Quadriceps 0 units Gastroc/soleus 100 units Hamstrings 0 units Tibialis Posterior 100 units EHL 0 units All injections were done after obtaining appropriate EMG activity and after negative drawback for blood. The patient tolerated the procedure well. Post procedure instructions were given. A followup appointment was made.

## 2013-09-21 ENCOUNTER — Other Ambulatory Visit: Payer: Self-pay | Admitting: Physical Medicine & Rehabilitation

## 2013-10-09 ENCOUNTER — Emergency Department (HOSPITAL_BASED_OUTPATIENT_CLINIC_OR_DEPARTMENT_OTHER)
Admission: EM | Admit: 2013-10-09 | Discharge: 2013-10-09 | Disposition: A | Discharge status: Home / Self Care | Payer: BC Managed Care – PPO | Attending: Emergency Medicine | Admitting: Emergency Medicine

## 2013-10-09 ENCOUNTER — Encounter (HOSPITAL_BASED_OUTPATIENT_CLINIC_OR_DEPARTMENT_OTHER): Payer: Self-pay | Admitting: Emergency Medicine

## 2013-10-09 DIAGNOSIS — R51 Headache: Secondary | ICD-10-CM | POA: Insufficient documentation

## 2013-10-09 DIAGNOSIS — Z7901 Long term (current) use of anticoagulants: Secondary | ICD-10-CM | POA: Insufficient documentation

## 2013-10-09 DIAGNOSIS — I10 Essential (primary) hypertension: Secondary | ICD-10-CM | POA: Insufficient documentation

## 2013-10-09 DIAGNOSIS — Z791 Long term (current) use of non-steroidal anti-inflammatories (NSAID): Secondary | ICD-10-CM | POA: Insufficient documentation

## 2013-10-09 DIAGNOSIS — E119 Type 2 diabetes mellitus without complications: Secondary | ICD-10-CM | POA: Insufficient documentation

## 2013-10-09 DIAGNOSIS — G40219 Localization-related (focal) (partial) symptomatic epilepsy and epileptic syndromes with complex partial seizures, intractable, without status epilepticus: Secondary | ICD-10-CM | POA: Insufficient documentation

## 2013-10-09 DIAGNOSIS — Z794 Long term (current) use of insulin: Secondary | ICD-10-CM | POA: Insufficient documentation

## 2013-10-09 DIAGNOSIS — Z79899 Other long term (current) drug therapy: Secondary | ICD-10-CM | POA: Insufficient documentation

## 2013-10-09 DIAGNOSIS — Z8673 Personal history of transient ischemic attack (TIA), and cerebral infarction without residual deficits: Secondary | ICD-10-CM | POA: Insufficient documentation

## 2013-10-09 DIAGNOSIS — G40209 Localization-related (focal) (partial) symptomatic epilepsy and epileptic syndromes with complex partial seizures, not intractable, without status epilepticus: Secondary | ICD-10-CM

## 2013-10-09 LAB — PROTIME-INR
INR: 1.79 — AB (ref 0.00–1.49)
PROTHROMBIN TIME: 20.3 s — AB (ref 11.6–15.2)

## 2013-10-09 LAB — GLUCOSE, CAPILLARY: Glucose-Capillary: 127 mg/dL — ABNORMAL HIGH (ref 70–99)

## 2013-10-09 NOTE — ED Provider Notes (Addendum)
CSN: [id number]     Arrival date & time 10/09/13  1030 History   First MD Initiated Contact with Patient 10/09/13 1038     No chief complaint on file.    (Consider location/radiation/quality/duration/timing/severity/associated sxs/prior Treatment) HPI Comments: Pt reports sitting on edge of bed when aura began, then waking up on the floor. She reports mild h/a that is similar to other post-seizure symptoms.   Patient is a 53 y.o. female presenting with seizures. The history is provided by the patient and a relative. No language interpreter was used.  Seizures Seizure type:  Partial complex Preceding symptoms: aura   Initial focality:  Left-sided Episode characteristics: abnormal movements and unresponsiveness   Episode characteristics comment:  Electric shocks down L side Return to baseline: yes   Severity:  Unable to specify Timing:  Once Number of seizures this episode:  1 Progression:  Resolved Context: intracranial lesion (hx of seizures beginning after a CVA)   Context: not alcohol withdrawal, not change in medication, not developmental delay, not drug use, not fever, not intracranial shunt, medical compliance, not possible hypoglycemia, not possible medication ingestion, not previous head injury and not stress   Recent head injury:  No recent head injuries PTA treatment:  None History of seizures: yes     Past Medical History  Diagnosis Date  . Seizures   . Stroke   . Diabetes mellitus   . Hypertension    Past Surgical History  Procedure Laterality Date  . Tracheostomy closure     Family History  Problem Relation Age of Onset  . Cancer Father    History  Substance Use Topics  . Smoking status: Never Smoker   . Smokeless tobacco: Never Used  . Alcohol Use: No   OB History   Grav Para Term Preterm Abortions TAB SAB Ect Mult Living                 Review of Systems  Constitutional: Negative for fever, chills, diaphoresis, activity change, appetite change and  fatigue.  HENT: Negative for congestion, facial swelling, rhinorrhea and sore throat.   Eyes: Negative for photophobia and discharge.  Respiratory: Negative for cough, chest tightness and shortness of breath.   Cardiovascular: Negative for chest pain, palpitations and leg swelling.  Gastrointestinal: Negative for nausea, vomiting, abdominal pain and diarrhea.  Endocrine: Negative for polydipsia and polyuria.  Genitourinary: Negative for dysuria, frequency, difficulty urinating and pelvic pain.  Musculoskeletal: Negative for arthralgias, back pain, neck pain and neck stiffness.  Skin: Negative for color change and wound.  Allergic/Immunologic: Negative for immunocompromised state.  Neurological: Positive for seizures and headaches. Negative for facial asymmetry, weakness and numbness.  Hematological: Does not bruise/bleed easily.  Psychiatric/Behavioral: Negative for confusion and agitation.      Allergies  Review of patient's allergies indicates no known allergies.  Home Medications   Current Outpatient Rx  Name  Route  Sig  Dispense  Refill  . baclofen (LIORESAL) 10 MG tablet      TAKE 1 TABLET (10 MG TOTAL) BY MOUTH 3 (THREE) TIMES DAILY.   90 tablet   3   . butalbital-acetaminophen-caffeine (FIORICET, ESGIC) 50-325-40 MG per tablet   Oral   Take 1 tablet by mouth 2 (two) times daily as needed for headache.         . Canagliflozin (INVOKANA) 300 MG TABS   Oral   Take 1 tablet by mouth daily.         . cholecalciferol (VITAMIN D) 1000  UNITS tablet   Oral   Take 1,000 Units by mouth daily.         . diclofenac sodium (VOLTAREN) 1 % GEL   Topical   Apply 1 application topically 4 (four) times daily.   2 Tube   2   . diphenhydrAMINE (BENADRYL) 25 MG tablet   Oral   Take 1 tablet (25 mg total) by mouth every 6 (six) hours.   20 tablet   0   . fish oil-omega-3 fatty acids 1000 MG capsule   Oral   Take 1 g by mouth 3 (three) times daily.          Marland Kitchen  gabapentin (NEURONTIN) 100 MG capsule   Oral   Take 400 mg by mouth 3 (three) times daily.          Marland Kitchen glimepiride (AMARYL) 1 MG tablet   Oral   Take 1 tablet (1 mg total) by mouth daily before breakfast.   30 tablet   5   . hydrocortisone cream 0.5 %   Topical   Apply topically as needed.         . insulin aspart (NOVOLOG) 100 UNIT/ML injection   Subcutaneous   Inject 40 Units into the skin 2 (two) times daily with a meal.         . lacosamide (VIMPAT) 200 MG TABS   Oral   Take by mouth 2 (two) times daily.           . metoprolol succinate (TOPROL-XL) 25 MG 24 hr tablet   Oral   Take 25 mg by mouth daily.           . norethindrone (AYGESTIN) 5 MG tablet      10 mg daily.          . simvastatin (ZOCOR) 20 MG tablet   Oral   Take 40 mg by mouth every evening.          . warfarin (COUMADIN) 10 MG tablet   Oral   Take 10 mg by mouth daily. Monday and Friday 10 mg and all other days 5 mg          There were no vitals taken for this visit. Physical Exam  Constitutional: She is oriented to person, place, and time. She appears well-developed and well-nourished. No distress.  HENT:  Head: Normocephalic and atraumatic. Head is without abrasion, without contusion and without laceration.  Mouth/Throat: No oropharyngeal exudate.  No signs of trauma to head or face  Eyes: Pupils are equal, round, and reactive to light.  Neck: Normal range of motion. Neck supple.  Cardiovascular: Normal rate, regular rhythm and normal heart sounds.  Exam reveals no gallop and no friction rub.   No murmur heard. Pulmonary/Chest: Effort normal and breath sounds normal. No respiratory distress. She has no wheezes. She has no rales.  Abdominal: Soft. Bowel sounds are normal. She exhibits no distension and no mass. There is no tenderness. There is no rebound and no guarding.  Musculoskeletal: Normal range of motion. She exhibits no edema and no tenderness.  Neurological: She is alert  and oriented to person, place, and time. No cranial nerve deficit or sensory deficit. She exhibits abnormal muscle tone. GCS eye subscore is 4. GCS verbal subscore is 5. GCS motor subscore is 6.  L arm paralysis.  LLE 4/5 strength which is baseline per pt.  Skin: Skin is warm and dry.  Psychiatric: She has a normal mood and affect.  ED Course  Procedures (including critical care time) Labs Review Labs Reviewed  GLUCOSE, CAPILLARY - Abnormal; Notable for the following:    Glucose-Capillary 127 (*)    All other components within normal limits  PROTIME-INR   Imaging Review No results found.  EKG Interpretation   None       MDM   Final diagnoses:  None    Pt is a 53 y.o. female with Pmhx as above including complex partial seizures who presents after suspected seizure at home, unwittnessed. Pt has returned to baseline, GCS 15. She reports mild h/a, similar to other seizure episodes. Neuro exam at baseline. Pt reports INR was low 5 day ago.  Will recheck.  INR 1.79.  At pt at baseline w/ unchanged neuro exam, will d/c home with plan for outpt f/u with her neurologist.  Strict return precautions given for new or worsening symptoms such as worsening headache, new focal neuro complaints, confusion, fever. She will call PCP today to discuss INR.         Neta Ehlers, MD 10/09/13 [place], MD 10/09/13 2215

## 2013-10-09 NOTE — Discharge Instructions (Signed)
Seizure, Adult A seizure is abnormal electrical activity in the brain. Seizures usually last from 30 seconds to 2 minutes. There are various types of seizures. Before a seizure, you may have a warning sensation (aura) that a seizure is about to occur. An aura may include the following symptoms:   Fear or anxiety.  Nausea.  Feeling like the room is spinning (vertigo).  Vision changes, such as seeing flashing lights or spots. Common symptoms during a seizure include:  A change in attention or behavior (altered mental status).  Convulsions with rhythmic jerking movements.  Drooling.  Rapid eye movements.  Grunting.  Loss of bladder and bowel control.  Bitter taste in the mouth.  Tongue biting. After a seizure, you may feel confused and sleepy. You may also have an injury resulting from convulsions during the seizure. HOME CARE INSTRUCTIONS   If you are given medicines, take them exactly as prescribed by your health care provider.  Keep all follow-up appointments as directed by your health care provider.  Do not swim or drive or engage in risky activity during which a seizure could cause further injury to you or others until your health care provider says it is OK.  Get adequate rest.  Teach friends and family what to do if you have a seizure. They should:  Lay you on the ground to prevent a fall.  Put a cushion under your head.  Loosen any tight clothing around your neck.  Turn you on your side. If vomiting occurs, this helps keep your airway clear.  Stay with you until you recover.  Know whether or not you need emergency care. SEEK IMMEDIATE MEDICAL CARE IF:  The seizure lasts longer than 5 minutes.  The seizure is severe or you do not wake up immediately after the seizure.  You have an altered mental status after the seizure.  You are having more frequent or worsening seizures. Someone should drive you to the emergency department or call local emergency  services (911 in [place] MAKE SURE YOU:  Understand these instructions.  Will watch your condition.  Will get help right away if you are not doing well or get worse. Document Released: 08/10/2000 Document Revised: 06/03/2013 Document Reviewed: 03/25/2013 [employer] Patient Information 2014 [employer].  

## 2013-11-11 ENCOUNTER — Encounter: Payer: Self-pay | Admitting: Physical Medicine & Rehabilitation

## 2013-11-11 ENCOUNTER — Encounter
Payer: BC Managed Care – PPO | Attending: Physical Medicine and Rehabilitation | Admitting: Physical Medicine & Rehabilitation

## 2013-11-11 VITALS — BP 130/69 | HR 95 | Resp 14 | Ht 63.0 in | Wt 259.0 lb

## 2013-11-11 DIAGNOSIS — M6702 Short Achilles tendon (acquired), left ankle: Secondary | ICD-10-CM

## 2013-11-11 DIAGNOSIS — IMO0001 Reserved for inherently not codable concepts without codable children: Secondary | ICD-10-CM

## 2013-11-11 DIAGNOSIS — M6240 Contracture of muscle, unspecified site: Secondary | ICD-10-CM

## 2013-11-11 DIAGNOSIS — I639 Cerebral infarction, unspecified: Secondary | ICD-10-CM

## 2013-11-11 DIAGNOSIS — G8110 Spastic hemiplegia affecting unspecified side: Secondary | ICD-10-CM

## 2013-11-11 DIAGNOSIS — I6350 Cerebral infarction due to unspecified occlusion or stenosis of unspecified cerebral artery: Secondary | ICD-10-CM

## 2013-11-11 NOTE — Patient Instructions (Signed)
CONTINUE TO WORK ON YOUR STRETCHING

## 2013-11-11 NOTE — Progress Notes (Signed)
Subjective:    Patient ID: Kimberly [name], female    DOB: 04-22-1961, 53 y.o.   MRN: [id number]  HPI  Kimberly [name] is back regarding her chronic spastic left hemiparesis. Her husband feels that the botox was helpful. [name] is not as sure. She has not had any return of voluntary movement of her left side which has made things more difficult.   She continues with her baclofen. Typically stretching schedule is related to the use of her JAS splint at night when her husband gets home. She wears the resting splints overnight as well. During the day she stretches a little as well. She notices when she wakes up in the morning that her hand and ankle arne't as tight.  Pain Inventory Average Pain 0 Pain Right Now 0 My pain is n/a  In the last 24 hours, has pain interfered with the following? General activity 0 Relation with others 0 Enjoyment of life 0 What TIME of day is your pain at its worst? n/a Sleep (in general) Fair  Pain is worse with: n/a Pain improves with: n/a Relief from Meds: n/a  Mobility use a cane use a wheelchair  Function Do you have any goals in this area?  yes  Neuro/Psych trouble walking  Prior Studies Any changes since last visit?  no  Physicians involved in your care Any changes since last visit?  no   Family History  Problem Relation Age of Onset  . Cancer Father    History   Social History  . Marital Status: Married    Spouse Name: N/A    Number of Children: N/A  . Years of Education: N/A   Social History Main Topics  . Smoking status: Never Smoker   . Smokeless tobacco: Never Used  . Alcohol Use: No  . Drug Use: No  . Sexual Activity: None   Other Topics Concern  . None   Social History Narrative  . None   Past Surgical History  Procedure Laterality Date  . Tracheostomy closure     Past Medical History  Diagnosis Date  . Seizures   . Stroke   . Diabetes mellitus   . Hypertension    BP 130/69  Pulse 95  Resp 14  Ht 5\' 3"   (1.6 m)  Wt 259 lb (117.482 kg)  BMI 45.89 kg/m2  SpO2 98%  Opioid Risk Score:   Fall Risk Score: High Fall Risk (>13 points) (patient educated handout decline)   Review of Systems  Musculoskeletal: Positive for gait problem.  All other systems reviewed and are negative.       Objective:   Physical Exam General: Alert and oriented x 3, No apparent distress  HEENT: Head is normocephalic, atraumatic, PERRLA, EOMI, sclera anicteric, oral mucosa pink and moist, dentition intact, ext ear canals clear,  Neck: Supple without JVD or lymphadenopathy  Heart: Reg rate and rhythm. No murmurs rubs or gallops  Chest: CTA bilaterally without wheezes, rales, or rhonchi; no distress  Abdomen: Soft, non-tender, non-distended, bowel sounds positive.  Extremities: No clubbing, cyanosis. Pulses are 2+  Skin: Clean and intact without signs of breakdown  Neuro: fair insight and awareness. Oriented x 3. Follows all commands. Left upper ist 1/5 at pec major with 1 1+ tone noted. LLE is 1-2/5 left hip and knee. 0/5 at left ankle with equinovarus position noted. Tone is again 3-4 at ankle---I was able to passively stretch her to -10 to 5 degrees neutral position. Left finger/thumbs are 2/4.  Sensation  is diminished but she still senses pain on the left. DTR's are 3+ on left. Speech is generally clear. She has a mild left central 7.  Musculoskeletal: she has a mild subluxation of .25 inches at left shoulder. Left shoulder is less tender overall. She has some edema at the left handPosture is fair.  Psych: Pt's affect is appropriate. Pt is cooperative   Assessment & Plan:   Assessment:  1.Remote right MCA infarct  2.Positive Lupus coagulant- on coumadin  3. Myofascial pain  4. Spastic left hemiparesis due to #1     Plan:  1. Continue baclofen 10mg  ,bTID for treat her left sided spasticity 2. Will Set up again with Botox to the left finger/wrist flexors-- will use 400u. Will arrange phenol injection to the  left tibial.  3. She has the left PRAFO and replacement left WHO to be used at night per [facility]. She is wearing these nightly. Needs to be more aggressive with her day time program 4. Continue to encourage stretching and ROM exercises at home.  5. Follow up in 4-6 weeks for injections. 30 minutes were spent with the patient today.

## 2013-12-02 ENCOUNTER — Other Ambulatory Visit: Payer: Self-pay | Admitting: Physical Medicine & Rehabilitation

## 2014-02-02 ENCOUNTER — Encounter
Payer: BC Managed Care – PPO | Attending: Physical Medicine and Rehabilitation | Admitting: Physical Medicine & Rehabilitation

## 2014-02-02 ENCOUNTER — Encounter: Payer: Self-pay | Admitting: Physical Medicine & Rehabilitation

## 2014-02-02 VITALS — BP 133/81 | HR 97 | Resp 16 | Ht 63.0 in | Wt 259.0 lb

## 2014-02-02 DIAGNOSIS — G8110 Spastic hemiplegia affecting unspecified side: Secondary | ICD-10-CM | POA: Diagnosis present

## 2014-02-02 DIAGNOSIS — I6350 Cerebral infarction due to unspecified occlusion or stenosis of unspecified cerebral artery: Secondary | ICD-10-CM

## 2014-02-02 DIAGNOSIS — I639 Cerebral infarction, unspecified: Secondary | ICD-10-CM

## 2014-02-02 DIAGNOSIS — IMO0001 Reserved for inherently not codable concepts without codable children: Secondary | ICD-10-CM | POA: Diagnosis present

## 2014-02-02 NOTE — Progress Notes (Signed)
Botox Injection for spasticity using needle EMG guidance Indication: spastic left hemiparesis  Dilution: 100 Units/ml        Total Units Injected: 400 Indication: Severe spasticity which interferes with ADL,mobility and/or  hygiene and is unresponsive to medication management and other conservative care Informed consent was obtained after describing risks and benefits of the procedure with the patient. This includes bleeding, bruising, infection, excessive weakness, or medication side effects. A REMS form is on file and signed.  Needle: 60mm injectable monopolar needle electrode  Number of units per muscle FCR 25 units FCU 25 units FDS 25 units FDP 25 units  Gastroc/soleus 200 units 4 access points   Tibialis Posterior 100 units 2 access points   All injections were done after obtaining appropriate EMG activity and after negative drawback for blood. The patient tolerated the procedure well. Post procedure instructions were given. A followup appointment was made.

## 2014-02-02 NOTE — Patient Instructions (Signed)
PLEASE CALL ME WITH ANY PROBLEMS OR QUESTIONS (#[phone]).      

## 2014-02-04 ENCOUNTER — Telehealth: Payer: Self-pay | Admitting: *Deleted

## 2014-02-04 DIAGNOSIS — IMO0001 Reserved for inherently not codable concepts without codable children: Secondary | ICD-10-CM

## 2014-02-04 DIAGNOSIS — G8110 Spastic hemiplegia affecting unspecified side: Secondary | ICD-10-CM

## 2014-02-04 NOTE — Telephone Encounter (Signed)
Kimberly [name] would like to have some therapy for her arms.

## 2014-02-08 NOTE — Telephone Encounter (Signed)
Patient says she has [employer] and they should cover her therapy.

## 2014-02-08 NOTE — Telephone Encounter (Signed)
Order for PT placed.

## 2014-02-08 NOTE — Telephone Encounter (Signed)
i would be willing to make a referral to neuro rehab to reassess her arm if that is what she would like---please confirm. (we focused most of the botox on her leg last visit thought!!!!)

## 2014-02-08 NOTE — Telephone Encounter (Signed)
She doesn't have any insurance. I'm not sure how we are able to refer her for an old problem for therapy. She needs to be aggressive with her HEP as we discussed.

## 2014-02-08 NOTE — Telephone Encounter (Signed)
Attempted to contact patient. Left a voicemail to return call to clinic.

## 2014-04-14 ENCOUNTER — Ambulatory Visit: Payer: BC Managed Care – PPO | Attending: Physical Medicine & Rehabilitation | Admitting: Physical Therapy

## 2014-04-14 ENCOUNTER — Encounter: Payer: Self-pay | Admitting: Physical Medicine & Rehabilitation

## 2014-04-14 ENCOUNTER — Encounter
Payer: BC Managed Care – PPO | Attending: Physical Medicine and Rehabilitation | Admitting: Physical Medicine & Rehabilitation

## 2014-04-14 ENCOUNTER — Other Ambulatory Visit: Payer: Self-pay

## 2014-04-14 VITALS — BP 145/81 | HR 100 | Resp 14

## 2014-04-14 DIAGNOSIS — M6240 Contracture of muscle, unspecified site: Secondary | ICD-10-CM | POA: Diagnosis present

## 2014-04-14 DIAGNOSIS — IMO0001 Reserved for inherently not codable concepts without codable children: Secondary | ICD-10-CM | POA: Insufficient documentation

## 2014-04-14 DIAGNOSIS — M6702 Short Achilles tendon (acquired), left ankle: Secondary | ICD-10-CM

## 2014-04-14 DIAGNOSIS — I6330 Cerebral infarction due to thrombosis of unspecified cerebral artery: Secondary | ICD-10-CM

## 2014-04-14 DIAGNOSIS — M629 Disorder of muscle, unspecified: Secondary | ICD-10-CM | POA: Insufficient documentation

## 2014-04-14 DIAGNOSIS — M2420 Disorder of ligament, unspecified site: Secondary | ICD-10-CM | POA: Insufficient documentation

## 2014-04-14 DIAGNOSIS — G8110 Spastic hemiplegia affecting unspecified side: Secondary | ICD-10-CM

## 2014-04-14 NOTE — Telephone Encounter (Signed)
[facility]in [facility] called requesting a order for OT also. Order placed.

## 2014-04-14 NOTE — Progress Notes (Signed)
Subjective:    Patient ID: Kimberly [name], female    DOB: 09/25/1960, 53 y.o.   MRN: [id number]  HPI  Kimberly [name] is back regarding her right spastic hemiparesis. She had good results with the botox in particular the left heel---she is fitting in her brace better although she still has difficulties with her left heel rolling and touching the bottom of the brace.   Pain Inventory Average Pain 0 Pain Right Now 0 My pain is no pain  In the last 24 hours, has pain interfered with the following? General activity 0 Relation with others 0 Enjoyment of life 0 What TIME of day is your pain at its worst? no pain Sleep (in general) Good  Pain is worse with: na Pain improves with: na Relief from Meds: no pain meds  Mobility walk with assistance use a cane ability to climb steps?  no do you drive?  no use a wheelchair transfers alone  Function Do you have any goals in this area?  no  Neuro/Psych weakness trouble walking  Prior Studies Any changes since last visit?  no  Physicians involved in your care Any changes since last visit?  no   Family History  Problem Relation Age of Onset  . Cancer Father    History   Social History  . Marital Status: Married    Spouse Name: N/A    Number of Children: N/A  . Years of Education: N/A   Social History Main Topics  . Smoking status: Never Smoker   . Smokeless tobacco: Never Used  . Alcohol Use: No  . Drug Use: No  . Sexual Activity: None   Other Topics Concern  . None   Social History Narrative  . None   Past Surgical History  Procedure Laterality Date  . Tracheostomy closure     Past Medical History  Diagnosis Date  . Seizures   . Stroke   . Diabetes mellitus   . Hypertension    BP 145/81  Pulse 100  Resp 14  SpO2 100%  Opioid Risk Score:   Fall Risk Score: Moderate Fall Risk (6-13 points) (pt educated, declined brochure)   Review of Systems  Musculoskeletal: Positive for gait problem.  Neurological:  Positive for weakness.  All other systems reviewed and are negative.      Objective:   Physical Exam  General: Alert and oriented x 3, No apparent distress  HEENT: Head is normocephalic, atraumatic, PERRLA, EOMI, sclera anicteric, oral mucosa pink and moist, dentition intact, ext ear canals clear,  Neck: Supple without JVD or lymphadenopathy  Heart: Reg rate and rhythm. No murmurs rubs or gallops  Chest: CTA bilaterally without wheezes, rales, or rhonchi; no distress  Abdomen: Soft, non-tender, non-distended, bowel sounds positive.  Extremities: No clubbing, cyanosis. Pulses are 2+  Skin: Clean and intact without signs of breakdown  Neuro: fair insight and awareness. Oriented x 3. Follows all commands. Left upper ist 1/5 at pec major with 1 1+ tone noted. LLE is 1-2/5 left hip and knee. 0/5 at left ankle with equinovarus position still noted which worsens with weight bearing on the left side during gait. Tone is again 2-3 at ankle-heel sits better into the shoe. Left finger/thumbs are 2/4. Sensation is diminished but she still senses pain on the left. DTR's are 3+ on left. Speech is generally clear. She has a mild left central 7.  Musculoskeletal: she has a mild subluxation of .25 inches at left shoulder. Left shoulder is less tender  overall. She has some edema at the left handPosture is fair.  Psych: Pt's affect is appropriate. Pt is cooperative    Assessment & Plan:   Assessment:  1.Remote right MCA infarct  2.Positive Lupus coagulant- on coumadin  3. Myofascial pain  4. Spastic left hemiparesis due to #1    Plan:  1. Continue baclofen 10mg TID for treat her left sided spasticity  2. Will Set up again with Botox to the left finger/wrist flexors, pronator teres, ?lumbricals--  400u.  -phenol injection to the left tibial nerve.  3. Will rx a new left AFO with anterior trim-lines, lateral tab with three point pressure system, wedge for left sole to make up for ongoing plantarflexor  contracture. Also will need a lift for the right shoe to accommodate the left heel wedge height.  4. Continue to encourage stretching and ROM exercises at home.  5. Follow up in 4-6 weeks for injections. 30 minutes were spent with the patient today.

## 2014-04-14 NOTE — Patient Instructions (Signed)
PLEASE CALL ME WITH ANY PROBLEMS OR QUESTIONS (#[phone]).      

## 2014-04-17 ENCOUNTER — Other Ambulatory Visit: Payer: Self-pay | Admitting: Physical Medicine & Rehabilitation

## 2014-04-21 ENCOUNTER — Ambulatory Visit: Payer: BC Managed Care – PPO | Admitting: Occupational Therapy

## 2014-04-21 DIAGNOSIS — M2420 Disorder of ligament, unspecified site: Secondary | ICD-10-CM | POA: Diagnosis not present

## 2014-04-21 DIAGNOSIS — IMO0001 Reserved for inherently not codable concepts without codable children: Secondary | ICD-10-CM | POA: Diagnosis present

## 2014-04-21 DIAGNOSIS — M629 Disorder of muscle, unspecified: Secondary | ICD-10-CM | POA: Diagnosis not present

## 2014-04-27 ENCOUNTER — Ambulatory Visit: Payer: BC Managed Care – PPO | Attending: Physical Medicine & Rehabilitation | Admitting: Occupational Therapy

## 2014-04-27 DIAGNOSIS — M629 Disorder of muscle, unspecified: Secondary | ICD-10-CM | POA: Insufficient documentation

## 2014-04-27 DIAGNOSIS — M2420 Disorder of ligament, unspecified site: Secondary | ICD-10-CM | POA: Insufficient documentation

## 2014-04-27 DIAGNOSIS — IMO0001 Reserved for inherently not codable concepts without codable children: Secondary | ICD-10-CM | POA: Insufficient documentation

## 2014-05-04 ENCOUNTER — Ambulatory Visit: Payer: BC Managed Care – PPO | Admitting: Occupational Therapy

## 2014-05-04 DIAGNOSIS — IMO0001 Reserved for inherently not codable concepts without codable children: Secondary | ICD-10-CM | POA: Diagnosis not present

## 2014-05-05 ENCOUNTER — Encounter: Payer: BC Managed Care – PPO | Admitting: Occupational Therapy

## 2014-05-12 ENCOUNTER — Encounter: Payer: BC Managed Care – PPO | Admitting: Occupational Therapy

## 2014-05-19 ENCOUNTER — Ambulatory Visit: Payer: BC Managed Care – PPO | Admitting: Occupational Therapy

## 2014-05-19 DIAGNOSIS — IMO0001 Reserved for inherently not codable concepts without codable children: Secondary | ICD-10-CM | POA: Diagnosis not present

## 2014-05-26 ENCOUNTER — Encounter: Payer: Self-pay | Admitting: Physical Medicine & Rehabilitation

## 2014-05-26 ENCOUNTER — Encounter
Payer: BC Managed Care – PPO | Attending: Physical Medicine and Rehabilitation | Admitting: Physical Medicine & Rehabilitation

## 2014-05-26 VITALS — BP 137/78 | HR 86 | Resp 14 | Ht 63.0 in | Wt 230.0 lb

## 2014-05-26 DIAGNOSIS — M6240 Contracture of muscle, unspecified site: Secondary | ICD-10-CM | POA: Insufficient documentation

## 2014-05-26 DIAGNOSIS — IMO0001 Reserved for inherently not codable concepts without codable children: Secondary | ICD-10-CM | POA: Diagnosis not present

## 2014-05-26 DIAGNOSIS — G8110 Spastic hemiplegia affecting unspecified side: Secondary | ICD-10-CM | POA: Insufficient documentation

## 2014-05-26 NOTE — Patient Instructions (Signed)
WORK ON YOUR RANGE OF MOTION AND STRETCHING EVERY DAY!!!!

## 2014-05-26 NOTE — Progress Notes (Signed)
Botox Injection for spasticity using needle EMG guidance Indication: spastic hemiparesis non-dominant---LUE  Dilution: 100 Units/ml        Total Units Injected: 400 Indication: Severe spasticity which interferes with ADL,mobility and/or  hygiene and is unresponsive to medication management and other conservative care Informed consent was obtained after describing risks and benefits of the procedure with the patient. This includes bleeding, bruising, infection, excessive weakness, or medication side effects. A REMS form is on file and signed.  Needle: 54mm injectable monopolar needle electrode  Number of units per muscle  FCR 25 units FCU 25 units FDS 100 units FDP 100units FPL 75 units Pronator Teres 50 units Pronator Quadratus 25 units   All injections were done after obtaining appropriate EMG activity and after negative drawback for blood. The patient tolerated the procedure well. Post procedure instructions were given. A followup appointment was made.    Phenol Injection:  Using nerve stimulator guidance, I localized the tibial nerve in the popliteal fossa. After tapering down to less than 0.23mA stimulation with continued tibial nerve response, after aspiration, I proceeded to inject 5cc of phenol solution around the tibial nerve. The patient experienced immediate relaxation of the tibial related muscles. She did still demonstrate tightness in her left achilles tendon, indicative of contracture.  Dc instructions were provided. She will follow up with me in about 2 months.

## 2014-07-28 ENCOUNTER — Encounter
Payer: BC Managed Care – PPO | Attending: Physical Medicine and Rehabilitation | Admitting: Physical Medicine & Rehabilitation

## 2014-07-28 ENCOUNTER — Encounter: Payer: Self-pay | Admitting: Physical Medicine & Rehabilitation

## 2014-07-28 VITALS — BP 106/70 | HR 94 | Resp 14 | Ht 63.5 in | Wt 230.0 lb

## 2014-07-28 DIAGNOSIS — M791 Myalgia: Secondary | ICD-10-CM | POA: Insufficient documentation

## 2014-07-28 DIAGNOSIS — I69354 Hemiplegia and hemiparesis following cerebral infarction affecting left non-dominant side: Secondary | ICD-10-CM | POA: Insufficient documentation

## 2014-07-28 DIAGNOSIS — M6702 Short Achilles tendon (acquired), left ankle: Secondary | ICD-10-CM

## 2014-07-28 DIAGNOSIS — Z7901 Long term (current) use of anticoagulants: Secondary | ICD-10-CM | POA: Insufficient documentation

## 2014-07-28 DIAGNOSIS — G8110 Spastic hemiplegia affecting unspecified side: Secondary | ICD-10-CM | POA: Diagnosis not present

## 2014-07-28 DIAGNOSIS — R760 Raised antibody titer: Secondary | ICD-10-CM | POA: Diagnosis not present

## 2014-07-28 DIAGNOSIS — G8114 Spastic hemiplegia affecting left nondominant side: Secondary | ICD-10-CM | POA: Diagnosis present

## 2014-07-28 NOTE — Patient Instructions (Signed)
CONTINUE TO WORK ON YOUR RANGE OF MOTION EVERY DAY!!!!  CHECK WITH JEFF ABOUT THE BRACE AND ADJUSTMENT

## 2014-07-28 NOTE — Progress Notes (Signed)
Subjective:    Patient ID: Kimberly [name], female    DOB: [birth year], 53 y.o.   MRN: [id number]  HPI   Kimberly [name] is back regarding her spastic left hemiparesis. She is using her JAS a couple hours per day. She does some stretching at home. She does more of her stretching at night when her husband is home.   She is in her chair most of the day.  She had her left AFO adjusted by [employer] a couple weeks ago and it's fitting better.   Mood has been fair to good.     Pain Inventory Average Pain no pain Pain Right Now no pain My pain is no pain  In the last 24 hours, has pain interfered with the following? General activity no pain Relation with others no pain Enjoyment of life no pain What TIME of day is your pain at its worst? no pain Sleep (in general) Good  Pain is worse with: no pain Pain improves with: no pain Relief from Meds: no pain  Mobility walk with assistance use a cane ability to climb steps?  no use a wheelchair transfers alone  Function not employed: date last employed .  Neuro/Psych No problems in this area  Prior Studies Any changes since last visit?  no  Physicians involved in your care Any changes since last visit?  no   Family History  Problem Relation Age of Onset  . Cancer Father    History   Social History  . Marital Status: Married    Spouse Name: N/A    Number of Children: N/A  . Years of Education: N/A   Social History Main Topics  . Smoking status: Never Smoker   . Smokeless tobacco: Never Used  . Alcohol Use: No  . Drug Use: No  . Sexual Activity: None   Other Topics Concern  . None   Social History Narrative   Past Surgical History  Procedure Laterality Date  . Tracheostomy closure     Past Medical History  Diagnosis Date  . Seizures   . Stroke   . Diabetes mellitus   . Hypertension    BP 106/70 mmHg  Pulse 94  Resp 14  Ht 5' 3.5" (1.613 m)  Wt 230 lb (104.327 kg)  BMI 40.10 kg/m2  SpO2 99%  Opioid Risk  Score:   Fall Risk Score: Low Fall Risk (0-5 points)  Review of Systems  All other systems reviewed and are negative.      Objective:   Physical Exam  General: Alert and oriented x 3, No apparent distress  HEENT: Head is normocephalic, atraumatic, PERRLA, EOMI, sclera anicteric, oral mucosa pink and moist, dentition intact, ext ear canals clear,  Neck: Supple without JVD or lymphadenopathy  Heart: Reg rate and rhythm. No murmurs rubs or gallops  Chest: CTA bilaterally without wheezes, rales, or rhonchi; no distress  Abdomen: Soft, non-tender, non-distended, bowel sounds positive.  Extremities: No clubbing, cyanosis. Pulses are 2+  Skin: Clean and intact without signs of breakdown  Neuro: fair insight and awareness. Oriented x 3. Follows all commands. Left upper ist 1/5 at pec major with 1 1+ tone noted. LLE is 1-2/5 left hip and knee. 0/5 at left ankle with equinovarus position still noted which worsens with weight bearing on the left side during gait. Tone is again 2 at ankle-heel with contracture but she can be ranged to near neutral position (-2 to -3 degrees) . Left finger/thumbs are 1/4. Sensation is diminished but  she still senses pain on the left. DTR's are 3+ on left. Speech is generally clear. She has a mild left central 7.  Musculoskeletal: she has a mild subluxation of .25 inches at left shoulder. Left shoulder is less tender overall. She has some edema at the left handPosture is fair.  Psych: Pt's affect is appropriate. Pt is cooperative    Assessment & Plan:   Assessment:  1.Remote right MCA infarct  2.Positive Lupus coagulant- on coumadin  3. Myofascial pain  4. Spastic left hemiparesis due to #1    Plan:  1. Continue baclofen 10mg TID for treat her left sided spasticity  2. Further botox/phenol next year . In the mean time I want her to be aggressive with her stretching at home. She has been doing a better job with ROM at home. Her husband helps.    3. Continue with adjusted AFO. She needs further adjustments above malleoli for pressure relief. 4. Monitor mood.   5. Follow up in 2 months to consider further injections. 30 minutes were spent with the patient today.

## 2014-09-28 ENCOUNTER — Ambulatory Visit: Payer: BC Managed Care – PPO | Admitting: Physical Medicine & Rehabilitation

## 2014-09-29 ENCOUNTER — Encounter: Payer: Self-pay | Admitting: Physical Medicine & Rehabilitation

## 2014-09-29 ENCOUNTER — Encounter
Payer: BLUE CROSS/BLUE SHIELD | Attending: Physical Medicine and Rehabilitation | Admitting: Physical Medicine & Rehabilitation

## 2014-09-29 VITALS — BP 134/67 | HR 80 | Resp 14

## 2014-09-29 DIAGNOSIS — M791 Myalgia: Secondary | ICD-10-CM | POA: Insufficient documentation

## 2014-09-29 DIAGNOSIS — Z7901 Long term (current) use of anticoagulants: Secondary | ICD-10-CM | POA: Insufficient documentation

## 2014-09-29 DIAGNOSIS — R760 Raised antibody titer: Secondary | ICD-10-CM | POA: Insufficient documentation

## 2014-09-29 DIAGNOSIS — M6702 Short Achilles tendon (acquired), left ankle: Secondary | ICD-10-CM | POA: Diagnosis not present

## 2014-09-29 DIAGNOSIS — G8114 Spastic hemiplegia affecting left nondominant side: Secondary | ICD-10-CM | POA: Diagnosis present

## 2014-09-29 DIAGNOSIS — G8110 Spastic hemiplegia affecting unspecified side: Secondary | ICD-10-CM

## 2014-09-29 DIAGNOSIS — I69354 Hemiplegia and hemiparesis following cerebral infarction affecting left non-dominant side: Secondary | ICD-10-CM | POA: Diagnosis not present

## 2014-09-29 NOTE — Patient Instructions (Signed)
CONTINUE WORKING ON REGULAR RANGE OF MOTION, STRETCHING AND GOOD POSTURE!!!

## 2014-09-29 NOTE — Progress Notes (Signed)
Subjective:    Patient ID: Kimberly [name], female    DOB: [birth year], 54 y.o.   MRN: [id number]  HPI   Kimberly [name] is back regarding her spastic left hemiparesis. Things have remained fairly stable.  She is maintaining on her HEP for left upper and lower extremities. She has occasional pain in her left shoulder and ribs when she sits.      Pain Inventory Average Pain no pain Pain Right Now no pain My pain is no pain  In the last 24 hours, has pain interfered with the following? General activity no pain Relation with others no pain Enjoyment of life 0 What TIME of day is your pain at its worst? no pain Sleep (in general) Good  Pain is worse with: no pain Pain improves with: no pain Relief from Meds: no pain  Mobility walk with assistance use a cane how many minutes can you walk? 2-3 ability to climb steps?  no do you drive?  no use a wheelchair transfers alone Do you have any goals in this area?  yes  Function Do you have any goals in this area?  no  Neuro/Psych No problems in this area  Prior Studies Any changes since last visit?  no  Physicians involved in your care Any changes since last visit?  no   Family History  Problem Relation Age of Onset  . Cancer Father    History   Social History  . Marital Status: Married    Spouse Name: N/A    Number of Children: N/A  . Years of Education: N/A   Social History Main Topics  . Smoking status: Never Smoker   . Smokeless tobacco: Never Used  . Alcohol Use: No  . Drug Use: No  . Sexual Activity: None   Other Topics Concern  . None   Social History Narrative   Past Surgical History  Procedure Laterality Date  . Tracheostomy closure     Past Medical History  Diagnosis Date  . Seizures   . Stroke   . Diabetes mellitus   . Hypertension    BP 134/67 mmHg  Pulse 80  Resp 14  SpO2 99%  Opioid Risk Score:   Fall Risk Score: Low Fall Risk (0-5 points) (pt declined pamphlet during todays  visit) Review of Systems  All other systems reviewed and are negative.      Objective:   Physical Exam General: Alert and oriented x 3, No apparent distress  HEENT: Head is normocephalic, atraumatic, PERRLA, EOMI, sclera anicteric, oral mucosa pink and moist, dentition intact, ext ear canals clear,  Neck: Supple without JVD or lymphadenopathy  Heart: Reg rate and rhythm. No murmurs rubs or gallops  Chest: CTA bilaterally without wheezes, rales, or rhonchi; no distress  Abdomen: Soft, non-tender, non-distended, bowel sounds positive.  Extremities: No clubbing, cyanosis. Pulses are 2+  Skin: Clean and intact without signs of breakdown  Neuro: fair insight and awareness. Oriented x 3. Follows all commands. Left upper ist 1/5 at pec major with 1+ tone noted. LLE is 1-2/5 left hip and knee. 0/5 at left ankle with equinovarus position still noted which worsens with weight bearing on the left side during gait. Tone is again 2 at ankle-heel with contracture but she can be ranged to near neutral position (-2 to -3 degrees) . Left finger/thumb/elbow tone is 1 to 1+/4. Sensation is diminished but she still senses pain on the left. DTR's are 3+ on left. Speech is generally clear. She has  a mild left central 7.  Musculoskeletal: she has a mild subluxation of .25 inches at left shoulder. Left shoulder is less tender overall. She has some edema at the left handPosture is fair.  Psych: Pt's affect is appropriate. Pt is cooperative    Assessment & Plan:   Assessment:  1.Remote right MCA infarct  2.Positive Lupus coagulant- on coumadin  3. Myofascial pain  4. Spastic left hemiparesis due to #1    Plan:  1. Continue baclofen 10mg TID for treat her left sided spasticity  2. Further botox/phenol later this year  Continue stretching, modalities, splints for now. Reviewed importance of posture 3. Continue with adjusted AFO. Current set up is working better for her. 4. Monitor mood.  5. Follow up in 4 months  to reassess further injections. 30 minutes were spent with the patient today.

## 2014-10-18 ENCOUNTER — Other Ambulatory Visit: Payer: Self-pay | Admitting: Physical Medicine & Rehabilitation

## 2015-01-26 ENCOUNTER — Encounter
Payer: BLUE CROSS/BLUE SHIELD | Attending: Physical Medicine and Rehabilitation | Admitting: Physical Medicine & Rehabilitation

## 2015-01-26 ENCOUNTER — Encounter: Payer: Self-pay | Admitting: Physical Medicine & Rehabilitation

## 2015-01-26 VITALS — BP 138/76 | HR 80 | Resp 14

## 2015-01-26 DIAGNOSIS — M791 Myalgia: Secondary | ICD-10-CM | POA: Diagnosis not present

## 2015-01-26 DIAGNOSIS — Z7901 Long term (current) use of anticoagulants: Secondary | ICD-10-CM | POA: Diagnosis not present

## 2015-01-26 DIAGNOSIS — I69354 Hemiplegia and hemiparesis following cerebral infarction affecting left non-dominant side: Secondary | ICD-10-CM | POA: Insufficient documentation

## 2015-01-26 DIAGNOSIS — R760 Raised antibody titer: Secondary | ICD-10-CM | POA: Insufficient documentation

## 2015-01-26 DIAGNOSIS — G8110 Spastic hemiplegia affecting unspecified side: Secondary | ICD-10-CM | POA: Diagnosis not present

## 2015-01-26 DIAGNOSIS — M6702 Short Achilles tendon (acquired), left ankle: Secondary | ICD-10-CM

## 2015-01-26 DIAGNOSIS — G8114 Spastic hemiplegia affecting left nondominant side: Secondary | ICD-10-CM | POA: Diagnosis present

## 2015-01-26 NOTE — Patient Instructions (Signed)
CONTINUE WORKING ON DAILY STRETCHING AND SPLINTING

## 2015-01-26 NOTE — Progress Notes (Signed)
Subjective:    Patient ID: Kimberly [name], female    DOB: 09-Oct-1960, 54 y.o.   MRN: [id number]  HPI  Kimberly [name] is here in follow up of her spastic left hemiparesis. She has been working more on her ambulation and stretching program. She is experiencing some tightness in the calf as well as her wrist and fingers.   Her mood has been good. She denies pain. She is excited to be going down to [place] to see her family this weekend.   Pain Inventory Average Pain 0 Pain Right Now 0 My pain is NO PAIN  In the last 24 hours, has pain interfered with the following? General activity 0 Relation with others 0 Enjoyment of life 0 What TIME of day is your pain at its worst? NO PAIN Sleep (in general) Fair  Pain is worse with: NO PAIN Pain improves with: no pain Relief from Meds: 0  Mobility use a cane use a walker ability to climb steps?  no do you drive?  no use a wheelchair  Function disabled: date disabled .  Neuro/Psych No problems in this area  Prior Studies Any changes since last visit?  no  Physicians involved in your care Any changes since last visit?  no   Family History  Problem Relation Age of Onset  . Cancer Father    History   Social History  . Marital Status: Married    Spouse Name: N/A  . Number of Children: N/A  . Years of Education: N/A   Social History Main Topics  . Smoking status: Never Smoker   . Smokeless tobacco: Never Used  . Alcohol Use: No  . Drug Use: No  . Sexual Activity: Not on file   Other Topics Concern  . None   Social History Narrative   Past Surgical History  Procedure Laterality Date  . Tracheostomy closure     Past Medical History  Diagnosis Date  . Seizures   . Stroke   . Diabetes mellitus   . Hypertension    BP 138/76 mmHg  Pulse 80  Resp 14  SpO2 98%  Opioid Risk Score:   Fall Risk Score: Low Fall Risk (0-5 points)`1  Depression screen PHQ 2/9  No flowsheet data found.   Review of Systems    Constitutional: Negative.   HENT: Negative.   Eyes: Negative.   Respiratory: Negative.   Cardiovascular: Negative.   Gastrointestinal: Negative.   Endocrine: Negative.   Genitourinary: Negative.   Musculoskeletal: Negative.   Skin: Negative.   Allergic/Immunologic: Negative.   Neurological: Negative.   Hematological: Negative.   Psychiatric/Behavioral: Negative.        Objective:   Physical Exam  General: Alert and oriented x 3, No apparent distress  HEENT: Head is normocephalic, atraumatic, PERRLA, EOMI, sclera anicteric, oral mucosa pink and moist, dentition intact, ext ear canals clear,  Neck: Supple without JVD or lymphadenopathy  Heart: Reg rate and rhythm. No murmurs rubs or gallops  Chest: CTA bilaterally without wheezes, rales, or rhonchi; no distress  Abdomen: Soft, non-tender, non-distended, bowel sounds positive.  Extremities: No clubbing, cyanosis. Pulses are 2+  Skin: Clean and intact without signs of breakdown  Neuro: fair insight and awareness. Oriented x 3. Follows all commands. Left upper ist 1/5 at pec major with 1+ tone noted. LLE is 1-2/5 left hip and knee. 0/5 at left ankle with equinovarus which is better. improved contracture with easier rom to neutral . Left finger/wrist tone is 2/4. Sensation is  diminished but she still senses pain on the left. DTR's are 3+ on left. Speech is generally clear. She has a mild left central 7.  Musculoskeletal: she has a mild subluxation of .25 inches at left shoulder. Left shoulder is less tender overall. She has some edema at the left handPosture is fair.  Psych: Pt's affect is appropriate. Pt is cooperative    Assessment & Plan: Assessment:  1.Remote right MCA infarct  2.Positive Lupus coagulant- on coumadin  3. Myofascial pain  4. Spastic left hemiparesis due to #1    Plan:  1. Continue baclofen 10mg TID for treat her left sided spasticity  2. Will set up for botox 300units to the left wrist and finger flexors.  3.  Advised patient to contact orthotist about adjustments/flaring to the anterior portion of brace  4. Mood has been reasonable. 5. Follow up in about a month or as appt available. 15 minutes were spent with the patient today.

## 2015-02-12 ENCOUNTER — Emergency Department (HOSPITAL_BASED_OUTPATIENT_CLINIC_OR_DEPARTMENT_OTHER)
Admission: EM | Admit: 2015-02-12 | Discharge: 2015-02-12 | Disposition: A | Discharge status: Home / Self Care | Payer: BLUE CROSS/BLUE SHIELD | Attending: Emergency Medicine | Admitting: Emergency Medicine

## 2015-02-12 ENCOUNTER — Encounter (HOSPITAL_BASED_OUTPATIENT_CLINIC_OR_DEPARTMENT_OTHER): Payer: Self-pay | Admitting: *Deleted

## 2015-02-12 DIAGNOSIS — Z8673 Personal history of transient ischemic attack (TIA), and cerebral infarction without residual deficits: Secondary | ICD-10-CM | POA: Diagnosis not present

## 2015-02-12 DIAGNOSIS — Z794 Long term (current) use of insulin: Secondary | ICD-10-CM | POA: Diagnosis not present

## 2015-02-12 DIAGNOSIS — L723 Sebaceous cyst: Secondary | ICD-10-CM | POA: Diagnosis not present

## 2015-02-12 DIAGNOSIS — Z791 Long term (current) use of non-steroidal anti-inflammatories (NSAID): Secondary | ICD-10-CM | POA: Diagnosis not present

## 2015-02-12 DIAGNOSIS — Z7901 Long term (current) use of anticoagulants: Secondary | ICD-10-CM | POA: Insufficient documentation

## 2015-02-12 DIAGNOSIS — L089 Local infection of the skin and subcutaneous tissue, unspecified: Secondary | ICD-10-CM | POA: Insufficient documentation

## 2015-02-12 DIAGNOSIS — Z793 Long term (current) use of hormonal contraceptives: Secondary | ICD-10-CM | POA: Insufficient documentation

## 2015-02-12 DIAGNOSIS — E119 Type 2 diabetes mellitus without complications: Secondary | ICD-10-CM | POA: Insufficient documentation

## 2015-02-12 DIAGNOSIS — G40909 Epilepsy, unspecified, not intractable, without status epilepticus: Secondary | ICD-10-CM | POA: Insufficient documentation

## 2015-02-12 DIAGNOSIS — I10 Essential (primary) hypertension: Secondary | ICD-10-CM | POA: Diagnosis not present

## 2015-02-12 DIAGNOSIS — L729 Follicular cyst of the skin and subcutaneous tissue, unspecified: Secondary | ICD-10-CM | POA: Diagnosis present

## 2015-02-12 DIAGNOSIS — Z79899 Other long term (current) drug therapy: Secondary | ICD-10-CM | POA: Diagnosis not present

## 2015-02-12 MED ORDER — DOXYCYCLINE HYCLATE 100 MG PO CAPS: 100.0000 mg | ORAL_CAPSULE | Freq: Two times a day (BID) | ORAL | Status: DC

## 2015-02-12 MED ORDER — TRAMADOL HCL 50 MG PO TABS: 50.0000 mg | ORAL_TABLET | Freq: Four times a day (QID) | ORAL | Status: DC | PRN

## 2015-02-12 NOTE — ED Provider Notes (Signed)
CSN: [id number]     Arrival date & time 02/12/15  1028 History   First MD Initiated Contact with Patient 02/12/15 1030     Chief Complaint  Patient presents with  . Cyst     (Consider location/radiation/quality/duration/timing/severity/associated sxs/prior Treatment) The history is provided by the patient.   patient with a three-month history of a skin cyst to left anterior proximal thigh. Evaluated by primary care physician in the past and felt to be a sebaceous cyst. Over the last few days it has gotten painful and has developed some redness. Patient denies any purulent discharge from it.  Past Medical History  Diagnosis Date  . Stroke   . Diabetes mellitus   . Hypertension   . Seizures     last seizure march 2016   Past Surgical History  Procedure Laterality Date  . Tracheostomy closure     Family History  Problem Relation Age of Onset  . Cancer Father    History  Substance Use Topics  . Smoking status: Never Smoker   . Smokeless tobacco: Never Used  . Alcohol Use: No   OB History    No data available     Review of Systems  Constitutional: Negative for fever.  HENT: Negative for congestion.   Eyes: Negative for redness.  Respiratory: Negative for shortness of breath.   Cardiovascular: Negative for chest pain.  Gastrointestinal: Negative for abdominal pain.  Genitourinary: Negative for dysuria.  Musculoskeletal: Negative for back pain.  Skin: Negative for rash.  Neurological: Negative for headaches.  Hematological: Does not bruise/bleed easily.  Psychiatric/Behavioral: Negative for confusion.      Allergies  Review of patient's allergies indicates no known allergies.  Home Medications   Prior to Admission medications   Medication Sig Start Date End Date Taking? Authorizing Provider  baclofen (LIORESAL) 10 MG tablet TAKE 1 TABLET (10 MG TOTAL) BY MOUTH 3 (THREE) TIMES DAILY. 10/19/14   Meredith Staggers, MD  butalbital-acetaminophen-caffeine (FIORICET,  ESGIC) [phone] MG per tablet Take 1 tablet by mouth 2 (two) times daily as needed for headache.    Historical Provider, MD  Canagliflozin (INVOKANA) 300 MG TABS Take 1 tablet by mouth daily.    Historical Provider, MD  cholecalciferol (VITAMIN D) 1000 UNITS tablet Take 1,000 Units by mouth daily.    Historical Provider, MD  diclofenac sodium (VOLTAREN) 1 % GEL Apply 1 application topically 4 (four) times daily. 03/12/12   Santiago Glad Prueter, PA-C  diphenhydrAMINE (BENADRYL) 25 MG tablet Take 1 tablet (25 mg total) by mouth every 6 (six) hours. 10/18/12   Alvina Chou, PA-C  doxycycline (VIBRAMYCIN) 100 MG capsule Take 1 capsule (100 mg total) by mouth 2 (two) times daily. 02/12/15   Fredia Sorrow, MD  fish oil-omega-3 fatty acids 1000 MG capsule Take 1 g by mouth 3 (three) times daily.     Historical Provider, MD  gabapentin (NEURONTIN) 100 MG capsule Take 400 mg by mouth 3 (three) times daily.     Historical Provider, MD  glimepiride (AMARYL) 1 MG tablet Take 1 tablet (1 mg total) by mouth daily before breakfast. 04/01/13   Meredith Staggers, MD  hydrocortisone cream 0.5 % Apply topically as needed.    Historical Provider, MD  insulin aspart (NOVOLOG) 100 UNIT/ML injection Inject 40 Units into the skin 2 (two) times daily with a meal.    Historical Provider, MD  lacosamide (VIMPAT) 200 MG TABS Take by mouth 2 (two) times daily.      Historical Provider,  MD  metoprolol succinate (TOPROL-XL) 25 MG 24 hr tablet Take 25 mg by mouth daily.      Historical Provider, MD  norethindrone (AYGESTIN) 5 MG tablet 10 mg daily.  04/13/12   Historical Provider, MD  simvastatin (ZOCOR) 20 MG tablet Take 40 mg by mouth every evening.     Historical Provider, MD  simvastatin (ZOCOR) 20 MG tablet Take 20 mg by mouth.    Historical Provider, MD  traMADol (ULTRAM) 50 MG tablet Take 1 tablet (50 mg total) by mouth every 6 (six) hours as needed. 02/12/15   Fredia Sorrow, MD  warfarin (COUMADIN) 10 MG tablet Take 10 mg by  mouth daily. Monday and Friday 10 mg and all other days 5 mg    Historical Provider, MD  zonisamide (ZONEGRAN) 100 MG capsule  10/14/13   Historical Provider, MD  zonisamide (ZONEGRAN) 100 MG capsule Take 300 mg by mouth. 02/12/14   Historical Provider, MD   BP 133/79 mmHg  Pulse 82  Temp(Src) 98.2 F (36.8 C) (Oral)  Resp 18  Ht 5\' 3"  (1.6 m)  Wt 228 lb (103.42 kg)  BMI 40.40 kg/m2  SpO2 99% Physical Exam  Constitutional: She is oriented to person, place, and time. She appears well-developed and well-nourished. No distress.  HENT:  Head: Normocephalic and atraumatic.  Mouth/Throat: Oropharynx is clear and moist.  Eyes: Conjunctivae and EOM are normal. Pupils are equal, round, and reactive to light.  Neck: Normal range of motion.  Cardiovascular: Normal rate, regular rhythm and normal heart sounds.   No murmur heard. Pulmonary/Chest: Effort normal and breath sounds normal. No respiratory distress.  Abdominal: Soft. Bowel sounds are normal. There is no tenderness.  Musculoskeletal: Normal range of motion. She exhibits tenderness. She exhibits no edema.  Left anterior thigh proximal near the groin area with a 1 cm skin cyst with 2 cm of erythema. No fluctuance. No purulent drainage.  Neurological: She is alert and oriented to person, place, and time. No cranial nerve deficit. She exhibits normal muscle tone. Coordination normal.  Skin: Skin is warm. There is erythema.  Nursing note and vitals reviewed.   ED Course  Procedures (including critical care time) Labs Review Labs Reviewed - No data to display  Imaging Review No results found.   EKG Interpretation None      MDM   Final diagnoses:  Infected sebaceous cyst    Patient with three-month history of skin cyst left anterior proximal 5 area. Known by primary care doctor felt to be a sebaceous cyst. In the last few days it's gotten slightly painful and had some redness form. Patient has no allergies antibody. No  significant fluctuance or abscess cavity at this time. Will be treated with doxycycline and some pain medicine and follow-up with regular doctor. Patient will return here if it turns into a boil or abscess.    Fredia Sorrow, MD 02/12/15 1113

## 2015-02-12 NOTE — Discharge Instructions (Signed)
Take anabolic as directed. Take tramadol as needed for pain. Make an appointment to follow-up with your regular doctor next week sometime for them to recheck this. When this settles down it can be removed by dermatology or surgery. It is possible that it could turn into an abscess or a boil if so return.

## 2015-02-12 NOTE — ED Notes (Signed)
Dr. Venita Sheffield in room with patient now.

## 2015-02-12 NOTE — ED Notes (Signed)
Pt reports pain left anterior thigh- reddened area noted which pt states her PCP told her was a sebaceous cyst

## 2015-02-19 ENCOUNTER — Encounter (HOSPITAL_BASED_OUTPATIENT_CLINIC_OR_DEPARTMENT_OTHER): Payer: Self-pay | Admitting: Emergency Medicine

## 2015-02-19 ENCOUNTER — Emergency Department (HOSPITAL_BASED_OUTPATIENT_CLINIC_OR_DEPARTMENT_OTHER)
Admission: EM | Admit: 2015-02-19 | Discharge: 2015-02-19 | Disposition: A | Discharge status: Home / Self Care | Payer: BLUE CROSS/BLUE SHIELD | Attending: Emergency Medicine | Admitting: Emergency Medicine

## 2015-02-19 DIAGNOSIS — Z79899 Other long term (current) drug therapy: Secondary | ICD-10-CM | POA: Insufficient documentation

## 2015-02-19 DIAGNOSIS — I69398 Other sequelae of cerebral infarction: Secondary | ICD-10-CM | POA: Diagnosis not present

## 2015-02-19 DIAGNOSIS — Z792 Long term (current) use of antibiotics: Secondary | ICD-10-CM | POA: Insufficient documentation

## 2015-02-19 DIAGNOSIS — I10 Essential (primary) hypertension: Secondary | ICD-10-CM | POA: Insufficient documentation

## 2015-02-19 DIAGNOSIS — G40909 Epilepsy, unspecified, not intractable, without status epilepticus: Secondary | ICD-10-CM | POA: Diagnosis not present

## 2015-02-19 DIAGNOSIS — R8290 Unspecified abnormal findings in urine: Secondary | ICD-10-CM | POA: Insufficient documentation

## 2015-02-19 DIAGNOSIS — R2689 Other abnormalities of gait and mobility: Secondary | ICD-10-CM | POA: Diagnosis not present

## 2015-02-19 DIAGNOSIS — Z794 Long term (current) use of insulin: Secondary | ICD-10-CM | POA: Diagnosis not present

## 2015-02-19 DIAGNOSIS — Z791 Long term (current) use of non-steroidal anti-inflammatories (NSAID): Secondary | ICD-10-CM | POA: Insufficient documentation

## 2015-02-19 DIAGNOSIS — E119 Type 2 diabetes mellitus without complications: Secondary | ICD-10-CM | POA: Diagnosis not present

## 2015-02-19 DIAGNOSIS — R319 Hematuria, unspecified: Secondary | ICD-10-CM | POA: Diagnosis present

## 2015-02-19 LAB — URINALYSIS, ROUTINE W REFLEX MICROSCOPIC
Bilirubin Urine: NEGATIVE
Hgb urine dipstick: NEGATIVE
Ketones, ur: >80 mg/dL — AB
Leukocytes, UA: NEGATIVE
NITRITE: NEGATIVE
PH: 5 (ref 5.0–8.0)
PROTEIN: NEGATIVE mg/dL
Specific Gravity, Urine: 1.046 — ABNORMAL HIGH (ref 1.005–1.030)
UROBILINOGEN UA: 0.2 mg/dL (ref 0.0–1.0)

## 2015-02-19 LAB — URINE MICROSCOPIC-ADD ON

## 2015-02-19 LAB — CBG MONITORING, ED: Glucose-Capillary: 116 mg/dL — ABNORMAL HIGH (ref 65–99)

## 2015-02-19 NOTE — ED Notes (Signed)
Pt noticed blood in urine last night and this morning. Denies any pain or other symptoms.

## 2015-02-19 NOTE — ED Provider Notes (Signed)
CSN: [id number]     Arrival date & time 02/19/15  9767 History   First MD Initiated Contact with Patient 02/19/15 1059     Chief Complaint  Patient presents with  . Hematuria     (Consider location/radiation/quality/duration/timing/severity/associated sxs/prior Treatment) HPI The patient states yesterday evening she had urgency to urinate and before she could make it to the toilet she had a small amount of incontinence in her pull-ups. She reports that there was some blood present in the urine. This morning she reports she urinated and then after she wiped she noted some urine blood on the tissue. She denies pain or burning with urination. She denies prior history of hematuria. She denies any vaginal symptoms. There has not been any flank pain, fever, malaise nausea vomiting or abdominal pain. Past Medical History  Diagnosis Date  . Stroke   . Diabetes mellitus   . Hypertension   . Seizures     last seizure march 2016   Past Surgical History  Procedure Laterality Date  . Tracheostomy closure     Family History  Problem Relation Age of Onset  . Cancer Father    History  Substance Use Topics  . Smoking status: Never Smoker   . Smokeless tobacco: Never Used  . Alcohol Use: No   OB History    No data available     Review of Systems  10 Systems reviewed and are negative for acute change except as noted in the HPI.   Allergies  Review of patient's allergies indicates no known allergies.  Home Medications   Prior to Admission medications   Medication Sig Start Date End Date Taking? Authorizing Provider  baclofen (LIORESAL) 10 MG tablet TAKE 1 TABLET (10 MG TOTAL) BY MOUTH 3 (THREE) TIMES DAILY. 10/19/14   Meredith Staggers, MD  butalbital-acetaminophen-caffeine (FIORICET, ESGIC) [phone] MG per tablet Take 1 tablet by mouth 2 (two) times daily as needed for headache.    Historical Provider, MD  Canagliflozin (INVOKANA) 300 MG TABS Take 1 tablet by mouth daily.     Historical Provider, MD  cholecalciferol (VITAMIN D) 1000 UNITS tablet Take 1,000 Units by mouth daily.    Historical Provider, MD  diclofenac sodium (VOLTAREN) 1 % GEL Apply 1 application topically 4 (four) times daily. 03/12/12   Santiago Glad Prueter, PA-C  diphenhydrAMINE (BENADRYL) 25 MG tablet Take 1 tablet (25 mg total) by mouth every 6 (six) hours. 10/18/12   Alvina Chou, PA-C  doxycycline (VIBRAMYCIN) 100 MG capsule Take 1 capsule (100 mg total) by mouth 2 (two) times daily. 02/12/15   Fredia Sorrow, MD  fish oil-omega-3 fatty acids 1000 MG capsule Take 1 g by mouth 3 (three) times daily.     Historical Provider, MD  gabapentin (NEURONTIN) 100 MG capsule Take 400 mg by mouth 3 (three) times daily.     Historical Provider, MD  glimepiride (AMARYL) 1 MG tablet Take 1 tablet (1 mg total) by mouth daily before breakfast. 04/01/13   Meredith Staggers, MD  hydrocortisone cream 0.5 % Apply topically as needed.    Historical Provider, MD  insulin aspart (NOVOLOG) 100 UNIT/ML injection Inject 40 Units into the skin 2 (two) times daily with a meal.    Historical Provider, MD  lacosamide (VIMPAT) 200 MG TABS Take by mouth 2 (two) times daily.      Historical Provider, MD  metoprolol succinate (TOPROL-XL) 25 MG 24 hr tablet Take 25 mg by mouth daily.      Historical Provider,  MD  norethindrone (AYGESTIN) 5 MG tablet 10 mg daily.  04/13/12   Historical Provider, MD  simvastatin (ZOCOR) 20 MG tablet Take 40 mg by mouth every evening.     Historical Provider, MD  simvastatin (ZOCOR) 20 MG tablet Take 20 mg by mouth.    Historical Provider, MD  traMADol (ULTRAM) 50 MG tablet Take 1 tablet (50 mg total) by mouth every 6 (six) hours as needed. 02/12/15   Fredia Sorrow, MD  warfarin (COUMADIN) 10 MG tablet Take 10 mg by mouth daily. Monday and Friday 10 mg and all other days 5 mg    Historical Provider, MD  zonisamide (ZONEGRAN) 100 MG capsule  10/14/13   Historical Provider, MD  zonisamide (ZONEGRAN) 100 MG  capsule Take 300 mg by mouth. 02/12/14   Historical Provider, MD   BP 134/75 mmHg  Pulse 84  Temp(Src) 98.5 F (36.9 C) (Oral)  Resp 18  Ht 5\' 3"  (1.6 m)  Wt 234 lb (106.142 kg)  BMI 41.46 kg/m2  SpO2 98% Physical Exam  Constitutional: She is oriented to person, place, and time.  Patient is alert and nontoxic. He has some baseline mobility dysfunction due to prior CVA but assists in standing and transferring. No respiratory distress. Alert and interactive.  Eyes: EOM are normal.  Cardiovascular: Normal rate, regular rhythm, normal heart sounds and intact distal pulses.   Pulmonary/Chest: Effort normal and breath sounds normal.  Abdominal: Soft. Bowel sounds are normal. She exhibits no distension. There is no tenderness.  No CVA tenderness  Genitourinary:  External genitalia are examined. No lesions or apparent abrasions or sources of bleeding. The labia are manually spread for inspection of the introitus and the urethra. Mucosa is normal and there is no appearance of vaginal discharge or bleeding.  Neurological: She is alert and oriented to person, place, and time.  The patient has left upper extremity paralysis and wears a brace on the left lower extremity.  Skin: Skin is warm and dry.  Psychiatric: She has a normal mood and affect.    ED Course  Procedures (including critical care time) Labs Review Labs Reviewed  URINALYSIS, ROUTINE W REFLEX MICROSCOPIC (NOT AT [facility]) - Abnormal; Notable for the following:    Specific Gravity, Urine 1.046 (*)    Glucose, UA >1000 (*)    Ketones, ur >80 (*)    All other components within normal limits  URINE MICROSCOPIC-ADD ON  CBG MONITORING, ED  CBG MONITORING, ED    Imaging Review No results found.   EKG Interpretation None      MDM   Final diagnoses:  Urine abnormality   Currently there is no evidence of bleeding at this time. The urine does not show blood present. Visual inspection of the vaginal introitus, urethra and  surrounding tissues did not show fissure or other lesion without the advantage of additional magnification. The patient otherwise does not have symptoms of kidney stone, she denies having had any flank pain, nausea, vomiting or diarrhea. The patient does have elevated glucose in her urine. The patient reports she has not taken her insulin yet this morning. She does not endorse any other symptoms of hyperosmolar condition. Her vital signs are stable and she is otherwise well appearance.    Charlesetta Shanks, MD 02/19/15 [phone]

## 2015-02-19 NOTE — Discharge Instructions (Signed)
At this time, no blood was identified in your urine or by inspection of urogenital area. Follow-up with your family doctor for recheck. Return to the emergency department if symptoms recur or other concerning symptoms develop.

## 2015-02-19 NOTE — ED Notes (Signed)
Pt also c/o infection on leg.  Was seen last week here for the same with no signs of infection noted at that time.   Pt followed up with PCP on Thursday and was told it was infected but not prescribed any new treatment.  Was advised to follow up with dermatology.

## 2015-03-23 ENCOUNTER — Ambulatory Visit: Payer: Medicare Other | Admitting: Physical Medicine & Rehabilitation

## 2015-04-03 ENCOUNTER — Other Ambulatory Visit: Payer: Self-pay | Admitting: Physical Medicine & Rehabilitation

## 2015-04-10 ENCOUNTER — Other Ambulatory Visit: Payer: Self-pay | Admitting: Physical Medicine & Rehabilitation

## 2015-08-06 ENCOUNTER — Other Ambulatory Visit: Payer: Self-pay | Admitting: Physical Medicine & Rehabilitation

## 2015-08-31 DIAGNOSIS — Z7901 Long term (current) use of anticoagulants: Secondary | ICD-10-CM | POA: Insufficient documentation

## 2015-08-31 DIAGNOSIS — Z5181 Encounter for therapeutic drug level monitoring: Secondary | ICD-10-CM | POA: Insufficient documentation

## 2015-11-13 ENCOUNTER — Other Ambulatory Visit: Payer: Self-pay | Admitting: Physical Medicine & Rehabilitation

## 2016-01-04 DIAGNOSIS — R791 Abnormal coagulation profile: Secondary | ICD-10-CM | POA: Insufficient documentation

## 2016-05-03 ENCOUNTER — Other Ambulatory Visit: Payer: Self-pay | Admitting: *Deleted

## 2016-05-08 ENCOUNTER — Other Ambulatory Visit: Payer: Self-pay | Admitting: *Deleted

## 2016-06-11 ENCOUNTER — Other Ambulatory Visit: Payer: Self-pay | Admitting: Physical Medicine & Rehabilitation

## 2016-09-05 DIAGNOSIS — G629 Polyneuropathy, unspecified: Secondary | ICD-10-CM | POA: Diagnosis not present

## 2016-09-05 DIAGNOSIS — Z5181 Encounter for therapeutic drug level monitoring: Secondary | ICD-10-CM | POA: Diagnosis not present

## 2016-09-05 DIAGNOSIS — Z8673 Personal history of transient ischemic attack (TIA), and cerebral infarction without residual deficits: Secondary | ICD-10-CM | POA: Diagnosis not present

## 2016-09-05 DIAGNOSIS — E785 Hyperlipidemia, unspecified: Secondary | ICD-10-CM | POA: Diagnosis not present

## 2016-09-05 DIAGNOSIS — E119 Type 2 diabetes mellitus without complications: Secondary | ICD-10-CM | POA: Diagnosis not present

## 2016-09-05 DIAGNOSIS — R569 Unspecified convulsions: Secondary | ICD-10-CM | POA: Diagnosis not present

## 2016-09-05 DIAGNOSIS — I6789 Other cerebrovascular disease: Secondary | ICD-10-CM | POA: Diagnosis not present

## 2016-09-05 DIAGNOSIS — I10 Essential (primary) hypertension: Secondary | ICD-10-CM | POA: Diagnosis not present

## 2016-09-05 DIAGNOSIS — R791 Abnormal coagulation profile: Secondary | ICD-10-CM | POA: Diagnosis not present

## 2016-09-05 DIAGNOSIS — K219 Gastro-esophageal reflux disease without esophagitis: Secondary | ICD-10-CM | POA: Diagnosis not present

## 2016-09-05 DIAGNOSIS — I69053 Hemiplegia and hemiparesis following nontraumatic subarachnoid hemorrhage affecting right non-dominant side: Secondary | ICD-10-CM | POA: Diagnosis not present

## 2016-09-05 DIAGNOSIS — Z86718 Personal history of other venous thrombosis and embolism: Secondary | ICD-10-CM | POA: Diagnosis not present

## 2016-09-05 DIAGNOSIS — Z7901 Long term (current) use of anticoagulants: Secondary | ICD-10-CM | POA: Diagnosis not present

## 2016-09-19 DIAGNOSIS — I6990 Unspecified sequelae of unspecified cerebrovascular disease: Secondary | ICD-10-CM | POA: Diagnosis not present

## 2016-09-19 DIAGNOSIS — G40219 Localization-related (focal) (partial) symptomatic epilepsy and epileptic syndromes with complex partial seizures, intractable, without status epilepticus: Secondary | ICD-10-CM | POA: Diagnosis not present

## 2016-09-26 DIAGNOSIS — Z8673 Personal history of transient ischemic attack (TIA), and cerebral infarction without residual deficits: Secondary | ICD-10-CM | POA: Diagnosis not present

## 2016-09-26 DIAGNOSIS — Z5181 Encounter for therapeutic drug level monitoring: Secondary | ICD-10-CM | POA: Diagnosis not present

## 2016-09-26 DIAGNOSIS — Z7901 Long term (current) use of anticoagulants: Secondary | ICD-10-CM | POA: Diagnosis not present

## 2016-10-24 DIAGNOSIS — Z01411 Encounter for gynecological examination (general) (routine) with abnormal findings: Secondary | ICD-10-CM | POA: Diagnosis not present

## 2016-10-24 DIAGNOSIS — N921 Excessive and frequent menstruation with irregular cycle: Secondary | ICD-10-CM | POA: Diagnosis not present

## 2016-10-31 DIAGNOSIS — R791 Abnormal coagulation profile: Secondary | ICD-10-CM | POA: Diagnosis not present

## 2016-10-31 DIAGNOSIS — Z5181 Encounter for therapeutic drug level monitoring: Secondary | ICD-10-CM | POA: Diagnosis not present

## 2016-10-31 DIAGNOSIS — Z8673 Personal history of transient ischemic attack (TIA), and cerebral infarction without residual deficits: Secondary | ICD-10-CM | POA: Diagnosis not present

## 2016-10-31 DIAGNOSIS — Z7901 Long term (current) use of anticoagulants: Secondary | ICD-10-CM | POA: Diagnosis not present

## 2016-11-21 DIAGNOSIS — Z7901 Long term (current) use of anticoagulants: Secondary | ICD-10-CM | POA: Diagnosis not present

## 2016-11-21 DIAGNOSIS — Z5181 Encounter for therapeutic drug level monitoring: Secondary | ICD-10-CM | POA: Diagnosis not present

## 2016-11-21 DIAGNOSIS — Z8673 Personal history of transient ischemic attack (TIA), and cerebral infarction without residual deficits: Secondary | ICD-10-CM | POA: Diagnosis not present

## 2016-11-21 DIAGNOSIS — R791 Abnormal coagulation profile: Secondary | ICD-10-CM | POA: Diagnosis not present

## 2016-12-05 DIAGNOSIS — K219 Gastro-esophageal reflux disease without esophagitis: Secondary | ICD-10-CM | POA: Diagnosis not present

## 2016-12-05 DIAGNOSIS — Z7901 Long term (current) use of anticoagulants: Secondary | ICD-10-CM | POA: Diagnosis not present

## 2016-12-05 DIAGNOSIS — R002 Palpitations: Secondary | ICD-10-CM | POA: Diagnosis not present

## 2016-12-05 DIAGNOSIS — I6789 Other cerebrovascular disease: Secondary | ICD-10-CM | POA: Diagnosis not present

## 2016-12-05 DIAGNOSIS — G629 Polyneuropathy, unspecified: Secondary | ICD-10-CM | POA: Diagnosis not present

## 2016-12-05 DIAGNOSIS — R569 Unspecified convulsions: Secondary | ICD-10-CM | POA: Diagnosis not present

## 2016-12-05 DIAGNOSIS — B373 Candidiasis of vulva and vagina: Secondary | ICD-10-CM | POA: Diagnosis not present

## 2016-12-05 DIAGNOSIS — E119 Type 2 diabetes mellitus without complications: Secondary | ICD-10-CM | POA: Diagnosis not present

## 2016-12-05 DIAGNOSIS — E785 Hyperlipidemia, unspecified: Secondary | ICD-10-CM | POA: Diagnosis not present

## 2016-12-05 DIAGNOSIS — I10 Essential (primary) hypertension: Secondary | ICD-10-CM | POA: Diagnosis not present

## 2016-12-05 DIAGNOSIS — I69053 Hemiplegia and hemiparesis following nontraumatic subarachnoid hemorrhage affecting right non-dominant side: Secondary | ICD-10-CM | POA: Diagnosis not present

## 2016-12-19 DIAGNOSIS — Z8673 Personal history of transient ischemic attack (TIA), and cerebral infarction without residual deficits: Secondary | ICD-10-CM | POA: Diagnosis not present

## 2016-12-19 DIAGNOSIS — Z7901 Long term (current) use of anticoagulants: Secondary | ICD-10-CM | POA: Diagnosis not present

## 2016-12-19 DIAGNOSIS — I69359 Hemiplegia and hemiparesis following cerebral infarction affecting unspecified side: Secondary | ICD-10-CM | POA: Diagnosis not present

## 2016-12-19 DIAGNOSIS — Z5181 Encounter for therapeutic drug level monitoring: Secondary | ICD-10-CM | POA: Diagnosis not present

## 2017-01-11 DIAGNOSIS — I517 Cardiomegaly: Secondary | ICD-10-CM | POA: Diagnosis not present

## 2017-01-11 DIAGNOSIS — R0602 Shortness of breath: Secondary | ICD-10-CM | POA: Diagnosis not present

## 2017-01-16 DIAGNOSIS — I6330 Cerebral infarction due to thrombosis of unspecified cerebral artery: Secondary | ICD-10-CM | POA: Diagnosis not present

## 2017-01-16 DIAGNOSIS — Z5181 Encounter for therapeutic drug level monitoring: Secondary | ICD-10-CM | POA: Diagnosis not present

## 2017-01-16 DIAGNOSIS — Z7901 Long term (current) use of anticoagulants: Secondary | ICD-10-CM | POA: Diagnosis not present

## 2017-01-21 DIAGNOSIS — I10 Essential (primary) hypertension: Secondary | ICD-10-CM | POA: Diagnosis not present

## 2017-01-21 DIAGNOSIS — M25552 Pain in left hip: Secondary | ICD-10-CM | POA: Diagnosis not present

## 2017-01-21 DIAGNOSIS — G4733 Obstructive sleep apnea (adult) (pediatric): Secondary | ICD-10-CM | POA: Diagnosis not present

## 2017-01-21 DIAGNOSIS — Z7901 Long term (current) use of anticoagulants: Secondary | ICD-10-CM | POA: Diagnosis not present

## 2017-01-21 DIAGNOSIS — Z7984 Long term (current) use of oral hypoglycemic drugs: Secondary | ICD-10-CM | POA: Diagnosis not present

## 2017-01-21 DIAGNOSIS — E1142 Type 2 diabetes mellitus with diabetic polyneuropathy: Secondary | ICD-10-CM | POA: Diagnosis not present

## 2017-01-21 DIAGNOSIS — K219 Gastro-esophageal reflux disease without esophagitis: Secondary | ICD-10-CM | POA: Diagnosis not present

## 2017-01-21 DIAGNOSIS — G40909 Epilepsy, unspecified, not intractable, without status epilepticus: Secondary | ICD-10-CM | POA: Diagnosis not present

## 2017-01-21 DIAGNOSIS — I69354 Hemiplegia and hemiparesis following cerebral infarction affecting left non-dominant side: Secondary | ICD-10-CM | POA: Diagnosis not present

## 2017-01-25 DIAGNOSIS — G4733 Obstructive sleep apnea (adult) (pediatric): Secondary | ICD-10-CM | POA: Diagnosis not present

## 2017-01-25 DIAGNOSIS — I69354 Hemiplegia and hemiparesis following cerebral infarction affecting left non-dominant side: Secondary | ICD-10-CM | POA: Diagnosis not present

## 2017-01-25 DIAGNOSIS — Z7984 Long term (current) use of oral hypoglycemic drugs: Secondary | ICD-10-CM | POA: Diagnosis not present

## 2017-01-25 DIAGNOSIS — M25552 Pain in left hip: Secondary | ICD-10-CM | POA: Diagnosis not present

## 2017-01-25 DIAGNOSIS — I10 Essential (primary) hypertension: Secondary | ICD-10-CM | POA: Diagnosis not present

## 2017-01-25 DIAGNOSIS — E1142 Type 2 diabetes mellitus with diabetic polyneuropathy: Secondary | ICD-10-CM | POA: Diagnosis not present

## 2017-01-25 DIAGNOSIS — Z7901 Long term (current) use of anticoagulants: Secondary | ICD-10-CM | POA: Diagnosis not present

## 2017-01-25 DIAGNOSIS — G40909 Epilepsy, unspecified, not intractable, without status epilepticus: Secondary | ICD-10-CM | POA: Diagnosis not present

## 2017-01-25 DIAGNOSIS — K219 Gastro-esophageal reflux disease without esophagitis: Secondary | ICD-10-CM | POA: Diagnosis not present

## 2017-01-30 DIAGNOSIS — K219 Gastro-esophageal reflux disease without esophagitis: Secondary | ICD-10-CM | POA: Diagnosis not present

## 2017-01-30 DIAGNOSIS — G4733 Obstructive sleep apnea (adult) (pediatric): Secondary | ICD-10-CM | POA: Diagnosis not present

## 2017-01-30 DIAGNOSIS — E1142 Type 2 diabetes mellitus with diabetic polyneuropathy: Secondary | ICD-10-CM | POA: Diagnosis not present

## 2017-01-30 DIAGNOSIS — Z7901 Long term (current) use of anticoagulants: Secondary | ICD-10-CM | POA: Diagnosis not present

## 2017-01-30 DIAGNOSIS — Z7984 Long term (current) use of oral hypoglycemic drugs: Secondary | ICD-10-CM | POA: Diagnosis not present

## 2017-01-30 DIAGNOSIS — I69354 Hemiplegia and hemiparesis following cerebral infarction affecting left non-dominant side: Secondary | ICD-10-CM | POA: Diagnosis not present

## 2017-01-30 DIAGNOSIS — G40909 Epilepsy, unspecified, not intractable, without status epilepticus: Secondary | ICD-10-CM | POA: Diagnosis not present

## 2017-01-30 DIAGNOSIS — M25552 Pain in left hip: Secondary | ICD-10-CM | POA: Diagnosis not present

## 2017-01-30 DIAGNOSIS — I10 Essential (primary) hypertension: Secondary | ICD-10-CM | POA: Diagnosis not present

## 2017-02-05 DIAGNOSIS — E1142 Type 2 diabetes mellitus with diabetic polyneuropathy: Secondary | ICD-10-CM | POA: Diagnosis not present

## 2017-02-05 DIAGNOSIS — G40909 Epilepsy, unspecified, not intractable, without status epilepticus: Secondary | ICD-10-CM | POA: Diagnosis not present

## 2017-02-05 DIAGNOSIS — Z7984 Long term (current) use of oral hypoglycemic drugs: Secondary | ICD-10-CM | POA: Diagnosis not present

## 2017-02-05 DIAGNOSIS — I10 Essential (primary) hypertension: Secondary | ICD-10-CM | POA: Diagnosis not present

## 2017-02-05 DIAGNOSIS — M25552 Pain in left hip: Secondary | ICD-10-CM | POA: Diagnosis not present

## 2017-02-05 DIAGNOSIS — G4733 Obstructive sleep apnea (adult) (pediatric): Secondary | ICD-10-CM | POA: Diagnosis not present

## 2017-02-05 DIAGNOSIS — Z7901 Long term (current) use of anticoagulants: Secondary | ICD-10-CM | POA: Diagnosis not present

## 2017-02-05 DIAGNOSIS — I69354 Hemiplegia and hemiparesis following cerebral infarction affecting left non-dominant side: Secondary | ICD-10-CM | POA: Diagnosis not present

## 2017-02-05 DIAGNOSIS — K219 Gastro-esophageal reflux disease without esophagitis: Secondary | ICD-10-CM | POA: Diagnosis not present

## 2017-02-06 DIAGNOSIS — E119 Type 2 diabetes mellitus without complications: Secondary | ICD-10-CM | POA: Diagnosis not present

## 2017-02-06 DIAGNOSIS — E785 Hyperlipidemia, unspecified: Secondary | ICD-10-CM | POA: Diagnosis not present

## 2017-02-06 DIAGNOSIS — I69053 Hemiplegia and hemiparesis following nontraumatic subarachnoid hemorrhage affecting right non-dominant side: Secondary | ICD-10-CM | POA: Diagnosis not present

## 2017-02-06 DIAGNOSIS — R002 Palpitations: Secondary | ICD-10-CM | POA: Diagnosis not present

## 2017-02-06 DIAGNOSIS — Z7901 Long term (current) use of anticoagulants: Secondary | ICD-10-CM | POA: Diagnosis not present

## 2017-02-06 DIAGNOSIS — R569 Unspecified convulsions: Secondary | ICD-10-CM | POA: Diagnosis not present

## 2017-02-06 DIAGNOSIS — I6789 Other cerebrovascular disease: Secondary | ICD-10-CM | POA: Diagnosis not present

## 2017-02-06 DIAGNOSIS — I10 Essential (primary) hypertension: Secondary | ICD-10-CM | POA: Diagnosis not present

## 2017-02-06 DIAGNOSIS — G629 Polyneuropathy, unspecified: Secondary | ICD-10-CM | POA: Diagnosis not present

## 2017-02-14 DIAGNOSIS — E1142 Type 2 diabetes mellitus with diabetic polyneuropathy: Secondary | ICD-10-CM | POA: Diagnosis not present

## 2017-02-14 DIAGNOSIS — I69354 Hemiplegia and hemiparesis following cerebral infarction affecting left non-dominant side: Secondary | ICD-10-CM | POA: Diagnosis not present

## 2017-02-14 DIAGNOSIS — Z7901 Long term (current) use of anticoagulants: Secondary | ICD-10-CM | POA: Diagnosis not present

## 2017-02-14 DIAGNOSIS — G4733 Obstructive sleep apnea (adult) (pediatric): Secondary | ICD-10-CM | POA: Diagnosis not present

## 2017-02-14 DIAGNOSIS — M25552 Pain in left hip: Secondary | ICD-10-CM | POA: Diagnosis not present

## 2017-02-14 DIAGNOSIS — K219 Gastro-esophageal reflux disease without esophagitis: Secondary | ICD-10-CM | POA: Diagnosis not present

## 2017-02-14 DIAGNOSIS — I10 Essential (primary) hypertension: Secondary | ICD-10-CM | POA: Diagnosis not present

## 2017-02-14 DIAGNOSIS — G40909 Epilepsy, unspecified, not intractable, without status epilepticus: Secondary | ICD-10-CM | POA: Diagnosis not present

## 2017-02-14 DIAGNOSIS — Z7984 Long term (current) use of oral hypoglycemic drugs: Secondary | ICD-10-CM | POA: Diagnosis not present

## 2017-02-19 DIAGNOSIS — I10 Essential (primary) hypertension: Secondary | ICD-10-CM | POA: Diagnosis not present

## 2017-02-19 DIAGNOSIS — G4733 Obstructive sleep apnea (adult) (pediatric): Secondary | ICD-10-CM | POA: Diagnosis not present

## 2017-02-19 DIAGNOSIS — E1142 Type 2 diabetes mellitus with diabetic polyneuropathy: Secondary | ICD-10-CM | POA: Diagnosis not present

## 2017-02-19 DIAGNOSIS — G40909 Epilepsy, unspecified, not intractable, without status epilepticus: Secondary | ICD-10-CM | POA: Diagnosis not present

## 2017-02-19 DIAGNOSIS — Z7984 Long term (current) use of oral hypoglycemic drugs: Secondary | ICD-10-CM | POA: Diagnosis not present

## 2017-02-19 DIAGNOSIS — I69354 Hemiplegia and hemiparesis following cerebral infarction affecting left non-dominant side: Secondary | ICD-10-CM | POA: Diagnosis not present

## 2017-02-19 DIAGNOSIS — Z7901 Long term (current) use of anticoagulants: Secondary | ICD-10-CM | POA: Diagnosis not present

## 2017-02-19 DIAGNOSIS — K219 Gastro-esophageal reflux disease without esophagitis: Secondary | ICD-10-CM | POA: Diagnosis not present

## 2017-02-19 DIAGNOSIS — M25552 Pain in left hip: Secondary | ICD-10-CM | POA: Diagnosis not present

## 2017-02-25 DIAGNOSIS — G40909 Epilepsy, unspecified, not intractable, without status epilepticus: Secondary | ICD-10-CM | POA: Diagnosis not present

## 2017-02-25 DIAGNOSIS — M25552 Pain in left hip: Secondary | ICD-10-CM | POA: Diagnosis not present

## 2017-02-25 DIAGNOSIS — Z7984 Long term (current) use of oral hypoglycemic drugs: Secondary | ICD-10-CM | POA: Diagnosis not present

## 2017-02-25 DIAGNOSIS — I10 Essential (primary) hypertension: Secondary | ICD-10-CM | POA: Diagnosis not present

## 2017-02-25 DIAGNOSIS — E1142 Type 2 diabetes mellitus with diabetic polyneuropathy: Secondary | ICD-10-CM | POA: Diagnosis not present

## 2017-02-25 DIAGNOSIS — G4733 Obstructive sleep apnea (adult) (pediatric): Secondary | ICD-10-CM | POA: Diagnosis not present

## 2017-02-25 DIAGNOSIS — K219 Gastro-esophageal reflux disease without esophagitis: Secondary | ICD-10-CM | POA: Diagnosis not present

## 2017-02-25 DIAGNOSIS — Z7901 Long term (current) use of anticoagulants: Secondary | ICD-10-CM | POA: Diagnosis not present

## 2017-02-25 DIAGNOSIS — I69354 Hemiplegia and hemiparesis following cerebral infarction affecting left non-dominant side: Secondary | ICD-10-CM | POA: Diagnosis not present

## 2017-02-28 DIAGNOSIS — I69354 Hemiplegia and hemiparesis following cerebral infarction affecting left non-dominant side: Secondary | ICD-10-CM | POA: Diagnosis not present

## 2017-02-28 DIAGNOSIS — G40909 Epilepsy, unspecified, not intractable, without status epilepticus: Secondary | ICD-10-CM | POA: Diagnosis not present

## 2017-02-28 DIAGNOSIS — Z7984 Long term (current) use of oral hypoglycemic drugs: Secondary | ICD-10-CM | POA: Diagnosis not present

## 2017-02-28 DIAGNOSIS — K219 Gastro-esophageal reflux disease without esophagitis: Secondary | ICD-10-CM | POA: Diagnosis not present

## 2017-02-28 DIAGNOSIS — I10 Essential (primary) hypertension: Secondary | ICD-10-CM | POA: Diagnosis not present

## 2017-02-28 DIAGNOSIS — E1142 Type 2 diabetes mellitus with diabetic polyneuropathy: Secondary | ICD-10-CM | POA: Diagnosis not present

## 2017-02-28 DIAGNOSIS — G4733 Obstructive sleep apnea (adult) (pediatric): Secondary | ICD-10-CM | POA: Diagnosis not present

## 2017-02-28 DIAGNOSIS — Z7901 Long term (current) use of anticoagulants: Secondary | ICD-10-CM | POA: Diagnosis not present

## 2017-02-28 DIAGNOSIS — M25552 Pain in left hip: Secondary | ICD-10-CM | POA: Diagnosis not present

## 2017-03-06 DIAGNOSIS — I69354 Hemiplegia and hemiparesis following cerebral infarction affecting left non-dominant side: Secondary | ICD-10-CM | POA: Diagnosis not present

## 2017-03-06 DIAGNOSIS — G40909 Epilepsy, unspecified, not intractable, without status epilepticus: Secondary | ICD-10-CM | POA: Diagnosis not present

## 2017-03-06 DIAGNOSIS — I10 Essential (primary) hypertension: Secondary | ICD-10-CM | POA: Diagnosis not present

## 2017-03-06 DIAGNOSIS — E1142 Type 2 diabetes mellitus with diabetic polyneuropathy: Secondary | ICD-10-CM | POA: Diagnosis not present

## 2017-03-06 DIAGNOSIS — Z7901 Long term (current) use of anticoagulants: Secondary | ICD-10-CM | POA: Diagnosis not present

## 2017-03-06 DIAGNOSIS — M25552 Pain in left hip: Secondary | ICD-10-CM | POA: Diagnosis not present

## 2017-03-06 DIAGNOSIS — Z7984 Long term (current) use of oral hypoglycemic drugs: Secondary | ICD-10-CM | POA: Diagnosis not present

## 2017-03-06 DIAGNOSIS — K219 Gastro-esophageal reflux disease without esophagitis: Secondary | ICD-10-CM | POA: Diagnosis not present

## 2017-03-06 DIAGNOSIS — G4733 Obstructive sleep apnea (adult) (pediatric): Secondary | ICD-10-CM | POA: Diagnosis not present

## 2017-03-08 DIAGNOSIS — M25552 Pain in left hip: Secondary | ICD-10-CM | POA: Diagnosis not present

## 2017-03-08 DIAGNOSIS — G40909 Epilepsy, unspecified, not intractable, without status epilepticus: Secondary | ICD-10-CM | POA: Diagnosis not present

## 2017-03-08 DIAGNOSIS — Z7901 Long term (current) use of anticoagulants: Secondary | ICD-10-CM | POA: Diagnosis not present

## 2017-03-08 DIAGNOSIS — G4733 Obstructive sleep apnea (adult) (pediatric): Secondary | ICD-10-CM | POA: Diagnosis not present

## 2017-03-08 DIAGNOSIS — K219 Gastro-esophageal reflux disease without esophagitis: Secondary | ICD-10-CM | POA: Diagnosis not present

## 2017-03-08 DIAGNOSIS — I69354 Hemiplegia and hemiparesis following cerebral infarction affecting left non-dominant side: Secondary | ICD-10-CM | POA: Diagnosis not present

## 2017-03-08 DIAGNOSIS — E1142 Type 2 diabetes mellitus with diabetic polyneuropathy: Secondary | ICD-10-CM | POA: Diagnosis not present

## 2017-03-08 DIAGNOSIS — I10 Essential (primary) hypertension: Secondary | ICD-10-CM | POA: Diagnosis not present

## 2017-03-08 DIAGNOSIS — Z7984 Long term (current) use of oral hypoglycemic drugs: Secondary | ICD-10-CM | POA: Diagnosis not present

## 2017-03-11 DIAGNOSIS — G4733 Obstructive sleep apnea (adult) (pediatric): Secondary | ICD-10-CM | POA: Diagnosis not present

## 2017-03-11 DIAGNOSIS — Z7901 Long term (current) use of anticoagulants: Secondary | ICD-10-CM | POA: Diagnosis not present

## 2017-03-11 DIAGNOSIS — I69354 Hemiplegia and hemiparesis following cerebral infarction affecting left non-dominant side: Secondary | ICD-10-CM | POA: Diagnosis not present

## 2017-03-11 DIAGNOSIS — M25552 Pain in left hip: Secondary | ICD-10-CM | POA: Diagnosis not present

## 2017-03-11 DIAGNOSIS — E1142 Type 2 diabetes mellitus with diabetic polyneuropathy: Secondary | ICD-10-CM | POA: Diagnosis not present

## 2017-03-11 DIAGNOSIS — K219 Gastro-esophageal reflux disease without esophagitis: Secondary | ICD-10-CM | POA: Diagnosis not present

## 2017-03-11 DIAGNOSIS — G40909 Epilepsy, unspecified, not intractable, without status epilepticus: Secondary | ICD-10-CM | POA: Diagnosis not present

## 2017-03-11 DIAGNOSIS — Z7984 Long term (current) use of oral hypoglycemic drugs: Secondary | ICD-10-CM | POA: Diagnosis not present

## 2017-03-11 DIAGNOSIS — I10 Essential (primary) hypertension: Secondary | ICD-10-CM | POA: Diagnosis not present

## 2017-03-12 DIAGNOSIS — Z7984 Long term (current) use of oral hypoglycemic drugs: Secondary | ICD-10-CM | POA: Diagnosis not present

## 2017-03-12 DIAGNOSIS — I10 Essential (primary) hypertension: Secondary | ICD-10-CM | POA: Diagnosis not present

## 2017-03-12 DIAGNOSIS — G40909 Epilepsy, unspecified, not intractable, without status epilepticus: Secondary | ICD-10-CM | POA: Diagnosis not present

## 2017-03-12 DIAGNOSIS — M25552 Pain in left hip: Secondary | ICD-10-CM | POA: Diagnosis not present

## 2017-03-12 DIAGNOSIS — Z7901 Long term (current) use of anticoagulants: Secondary | ICD-10-CM | POA: Diagnosis not present

## 2017-03-12 DIAGNOSIS — E1142 Type 2 diabetes mellitus with diabetic polyneuropathy: Secondary | ICD-10-CM | POA: Diagnosis not present

## 2017-03-12 DIAGNOSIS — K219 Gastro-esophageal reflux disease without esophagitis: Secondary | ICD-10-CM | POA: Diagnosis not present

## 2017-03-12 DIAGNOSIS — G4733 Obstructive sleep apnea (adult) (pediatric): Secondary | ICD-10-CM | POA: Diagnosis not present

## 2017-03-12 DIAGNOSIS — I69354 Hemiplegia and hemiparesis following cerebral infarction affecting left non-dominant side: Secondary | ICD-10-CM | POA: Diagnosis not present

## 2017-03-13 DIAGNOSIS — I6990 Unspecified sequelae of unspecified cerebrovascular disease: Secondary | ICD-10-CM | POA: Diagnosis not present

## 2017-03-13 DIAGNOSIS — G40219 Localization-related (focal) (partial) symptomatic epilepsy and epileptic syndromes with complex partial seizures, intractable, without status epilepticus: Secondary | ICD-10-CM | POA: Diagnosis not present

## 2017-03-14 DIAGNOSIS — I69354 Hemiplegia and hemiparesis following cerebral infarction affecting left non-dominant side: Secondary | ICD-10-CM | POA: Diagnosis not present

## 2017-03-14 DIAGNOSIS — G40909 Epilepsy, unspecified, not intractable, without status epilepticus: Secondary | ICD-10-CM | POA: Diagnosis not present

## 2017-03-14 DIAGNOSIS — E1142 Type 2 diabetes mellitus with diabetic polyneuropathy: Secondary | ICD-10-CM | POA: Diagnosis not present

## 2017-03-14 DIAGNOSIS — M25552 Pain in left hip: Secondary | ICD-10-CM | POA: Diagnosis not present

## 2017-03-14 DIAGNOSIS — Z7984 Long term (current) use of oral hypoglycemic drugs: Secondary | ICD-10-CM | POA: Diagnosis not present

## 2017-03-14 DIAGNOSIS — Z7901 Long term (current) use of anticoagulants: Secondary | ICD-10-CM | POA: Diagnosis not present

## 2017-03-14 DIAGNOSIS — I10 Essential (primary) hypertension: Secondary | ICD-10-CM | POA: Diagnosis not present

## 2017-03-14 DIAGNOSIS — G4733 Obstructive sleep apnea (adult) (pediatric): Secondary | ICD-10-CM | POA: Diagnosis not present

## 2017-03-14 DIAGNOSIS — K219 Gastro-esophageal reflux disease without esophagitis: Secondary | ICD-10-CM | POA: Diagnosis not present

## 2017-03-15 DIAGNOSIS — Z7901 Long term (current) use of anticoagulants: Secondary | ICD-10-CM | POA: Diagnosis not present

## 2017-03-15 DIAGNOSIS — G4733 Obstructive sleep apnea (adult) (pediatric): Secondary | ICD-10-CM | POA: Diagnosis not present

## 2017-03-15 DIAGNOSIS — E1142 Type 2 diabetes mellitus with diabetic polyneuropathy: Secondary | ICD-10-CM | POA: Diagnosis not present

## 2017-03-15 DIAGNOSIS — M25552 Pain in left hip: Secondary | ICD-10-CM | POA: Diagnosis not present

## 2017-03-15 DIAGNOSIS — Z7984 Long term (current) use of oral hypoglycemic drugs: Secondary | ICD-10-CM | POA: Diagnosis not present

## 2017-03-15 DIAGNOSIS — I10 Essential (primary) hypertension: Secondary | ICD-10-CM | POA: Diagnosis not present

## 2017-03-15 DIAGNOSIS — K219 Gastro-esophageal reflux disease without esophagitis: Secondary | ICD-10-CM | POA: Diagnosis not present

## 2017-03-15 DIAGNOSIS — I69354 Hemiplegia and hemiparesis following cerebral infarction affecting left non-dominant side: Secondary | ICD-10-CM | POA: Diagnosis not present

## 2017-03-15 DIAGNOSIS — I69359 Hemiplegia and hemiparesis following cerebral infarction affecting unspecified side: Secondary | ICD-10-CM | POA: Diagnosis not present

## 2017-03-15 DIAGNOSIS — G40909 Epilepsy, unspecified, not intractable, without status epilepticus: Secondary | ICD-10-CM | POA: Diagnosis not present

## 2017-03-18 DIAGNOSIS — M25552 Pain in left hip: Secondary | ICD-10-CM | POA: Diagnosis not present

## 2017-03-18 DIAGNOSIS — G4733 Obstructive sleep apnea (adult) (pediatric): Secondary | ICD-10-CM | POA: Diagnosis not present

## 2017-03-18 DIAGNOSIS — I10 Essential (primary) hypertension: Secondary | ICD-10-CM | POA: Diagnosis not present

## 2017-03-18 DIAGNOSIS — I69354 Hemiplegia and hemiparesis following cerebral infarction affecting left non-dominant side: Secondary | ICD-10-CM | POA: Diagnosis not present

## 2017-03-18 DIAGNOSIS — E1142 Type 2 diabetes mellitus with diabetic polyneuropathy: Secondary | ICD-10-CM | POA: Diagnosis not present

## 2017-03-18 DIAGNOSIS — Z7901 Long term (current) use of anticoagulants: Secondary | ICD-10-CM | POA: Diagnosis not present

## 2017-03-18 DIAGNOSIS — G40909 Epilepsy, unspecified, not intractable, without status epilepticus: Secondary | ICD-10-CM | POA: Diagnosis not present

## 2017-03-18 DIAGNOSIS — Z7984 Long term (current) use of oral hypoglycemic drugs: Secondary | ICD-10-CM | POA: Diagnosis not present

## 2017-03-18 DIAGNOSIS — K219 Gastro-esophageal reflux disease without esophagitis: Secondary | ICD-10-CM | POA: Diagnosis not present

## 2017-03-19 DIAGNOSIS — G4733 Obstructive sleep apnea (adult) (pediatric): Secondary | ICD-10-CM | POA: Diagnosis not present

## 2017-03-19 DIAGNOSIS — Z7901 Long term (current) use of anticoagulants: Secondary | ICD-10-CM | POA: Diagnosis not present

## 2017-03-19 DIAGNOSIS — M25552 Pain in left hip: Secondary | ICD-10-CM | POA: Diagnosis not present

## 2017-03-19 DIAGNOSIS — E1142 Type 2 diabetes mellitus with diabetic polyneuropathy: Secondary | ICD-10-CM | POA: Diagnosis not present

## 2017-03-19 DIAGNOSIS — Z7984 Long term (current) use of oral hypoglycemic drugs: Secondary | ICD-10-CM | POA: Diagnosis not present

## 2017-03-19 DIAGNOSIS — G40909 Epilepsy, unspecified, not intractable, without status epilepticus: Secondary | ICD-10-CM | POA: Diagnosis not present

## 2017-03-19 DIAGNOSIS — I10 Essential (primary) hypertension: Secondary | ICD-10-CM | POA: Diagnosis not present

## 2017-03-19 DIAGNOSIS — I69354 Hemiplegia and hemiparesis following cerebral infarction affecting left non-dominant side: Secondary | ICD-10-CM | POA: Diagnosis not present

## 2017-03-19 DIAGNOSIS — K219 Gastro-esophageal reflux disease without esophagitis: Secondary | ICD-10-CM | POA: Diagnosis not present

## 2017-03-27 DIAGNOSIS — Z5181 Encounter for therapeutic drug level monitoring: Secondary | ICD-10-CM | POA: Diagnosis not present

## 2017-03-27 DIAGNOSIS — I6330 Cerebral infarction due to thrombosis of unspecified cerebral artery: Secondary | ICD-10-CM | POA: Diagnosis not present

## 2017-03-27 DIAGNOSIS — Z7901 Long term (current) use of anticoagulants: Secondary | ICD-10-CM | POA: Diagnosis not present

## 2017-03-30 DIAGNOSIS — I69359 Hemiplegia and hemiparesis following cerebral infarction affecting unspecified side: Secondary | ICD-10-CM | POA: Diagnosis not present

## 2017-04-09 DIAGNOSIS — I6789 Other cerebrovascular disease: Secondary | ICD-10-CM | POA: Diagnosis not present

## 2017-04-09 DIAGNOSIS — G8190 Hemiplegia, unspecified affecting unspecified side: Secondary | ICD-10-CM | POA: Diagnosis not present

## 2017-04-09 DIAGNOSIS — I10 Essential (primary) hypertension: Secondary | ICD-10-CM | POA: Diagnosis not present

## 2017-04-10 DIAGNOSIS — I10 Essential (primary) hypertension: Secondary | ICD-10-CM | POA: Diagnosis not present

## 2017-04-10 DIAGNOSIS — K219 Gastro-esophageal reflux disease without esophagitis: Secondary | ICD-10-CM | POA: Diagnosis not present

## 2017-04-10 DIAGNOSIS — R569 Unspecified convulsions: Secondary | ICD-10-CM | POA: Diagnosis not present

## 2017-04-10 DIAGNOSIS — I6789 Other cerebrovascular disease: Secondary | ICD-10-CM | POA: Diagnosis not present

## 2017-04-10 DIAGNOSIS — M25552 Pain in left hip: Secondary | ICD-10-CM | POA: Diagnosis not present

## 2017-04-10 DIAGNOSIS — E785 Hyperlipidemia, unspecified: Secondary | ICD-10-CM | POA: Diagnosis not present

## 2017-04-10 DIAGNOSIS — E119 Type 2 diabetes mellitus without complications: Secondary | ICD-10-CM | POA: Diagnosis not present

## 2017-04-10 DIAGNOSIS — B373 Candidiasis of vulva and vagina: Secondary | ICD-10-CM | POA: Diagnosis not present

## 2017-04-10 DIAGNOSIS — R002 Palpitations: Secondary | ICD-10-CM | POA: Diagnosis not present

## 2017-04-10 DIAGNOSIS — Z7901 Long term (current) use of anticoagulants: Secondary | ICD-10-CM | POA: Diagnosis not present

## 2017-04-10 DIAGNOSIS — G629 Polyneuropathy, unspecified: Secondary | ICD-10-CM | POA: Diagnosis not present

## 2017-04-10 DIAGNOSIS — I69053 Hemiplegia and hemiparesis following nontraumatic subarachnoid hemorrhage affecting right non-dominant side: Secondary | ICD-10-CM | POA: Diagnosis not present

## 2017-04-15 DIAGNOSIS — M25512 Pain in left shoulder: Secondary | ICD-10-CM | POA: Diagnosis not present

## 2017-04-16 DIAGNOSIS — R0602 Shortness of breath: Secondary | ICD-10-CM | POA: Insufficient documentation

## 2017-04-16 DIAGNOSIS — I517 Cardiomegaly: Secondary | ICD-10-CM | POA: Diagnosis not present

## 2017-04-16 DIAGNOSIS — R002 Palpitations: Secondary | ICD-10-CM | POA: Diagnosis not present

## 2017-04-16 DIAGNOSIS — R931 Abnormal findings on diagnostic imaging of heart and coronary circulation: Secondary | ICD-10-CM | POA: Diagnosis not present

## 2017-04-17 DIAGNOSIS — Z1231 Encounter for screening mammogram for malignant neoplasm of breast: Secondary | ICD-10-CM | POA: Diagnosis not present

## 2017-04-22 DIAGNOSIS — Z6841 Body Mass Index (BMI) 40.0 and over, adult: Secondary | ICD-10-CM | POA: Diagnosis not present

## 2017-04-22 DIAGNOSIS — I69354 Hemiplegia and hemiparesis following cerebral infarction affecting left non-dominant side: Secondary | ICD-10-CM | POA: Diagnosis not present

## 2017-04-22 DIAGNOSIS — G8929 Other chronic pain: Secondary | ICD-10-CM | POA: Diagnosis not present

## 2017-04-22 DIAGNOSIS — Z7901 Long term (current) use of anticoagulants: Secondary | ICD-10-CM | POA: Diagnosis not present

## 2017-04-22 DIAGNOSIS — M25512 Pain in left shoulder: Secondary | ICD-10-CM | POA: Diagnosis not present

## 2017-04-22 DIAGNOSIS — E663 Overweight: Secondary | ICD-10-CM | POA: Diagnosis not present

## 2017-05-08 DIAGNOSIS — Z5181 Encounter for therapeutic drug level monitoring: Secondary | ICD-10-CM | POA: Diagnosis not present

## 2017-05-08 DIAGNOSIS — I6330 Cerebral infarction due to thrombosis of unspecified cerebral artery: Secondary | ICD-10-CM | POA: Diagnosis not present

## 2017-05-08 DIAGNOSIS — H524 Presbyopia: Secondary | ICD-10-CM | POA: Diagnosis not present

## 2017-05-08 DIAGNOSIS — Z7901 Long term (current) use of anticoagulants: Secondary | ICD-10-CM | POA: Diagnosis not present

## 2017-05-10 DIAGNOSIS — I6789 Other cerebrovascular disease: Secondary | ICD-10-CM | POA: Diagnosis not present

## 2017-05-22 DIAGNOSIS — I10 Essential (primary) hypertension: Secondary | ICD-10-CM | POA: Diagnosis not present

## 2017-05-22 DIAGNOSIS — Z0000 Encounter for general adult medical examination without abnormal findings: Secondary | ICD-10-CM | POA: Diagnosis not present

## 2017-05-22 DIAGNOSIS — E785 Hyperlipidemia, unspecified: Secondary | ICD-10-CM | POA: Diagnosis not present

## 2017-05-22 DIAGNOSIS — M24575 Contracture, left foot: Secondary | ICD-10-CM | POA: Diagnosis not present

## 2017-05-22 DIAGNOSIS — Z23 Encounter for immunization: Secondary | ICD-10-CM | POA: Diagnosis not present

## 2017-05-22 DIAGNOSIS — E119 Type 2 diabetes mellitus without complications: Secondary | ICD-10-CM | POA: Diagnosis not present

## 2017-06-01 DIAGNOSIS — I69359 Hemiplegia and hemiparesis following cerebral infarction affecting unspecified side: Secondary | ICD-10-CM | POA: Diagnosis not present

## 2017-06-09 DIAGNOSIS — I6789 Other cerebrovascular disease: Secondary | ICD-10-CM | POA: Diagnosis not present

## 2017-06-19 DIAGNOSIS — I6330 Cerebral infarction due to thrombosis of unspecified cerebral artery: Secondary | ICD-10-CM | POA: Diagnosis not present

## 2017-06-19 DIAGNOSIS — Z7901 Long term (current) use of anticoagulants: Secondary | ICD-10-CM | POA: Diagnosis not present

## 2017-06-19 DIAGNOSIS — Z5181 Encounter for therapeutic drug level monitoring: Secondary | ICD-10-CM | POA: Diagnosis not present

## 2017-07-10 DIAGNOSIS — I6789 Other cerebrovascular disease: Secondary | ICD-10-CM | POA: Diagnosis not present

## 2017-08-09 DIAGNOSIS — I6789 Other cerebrovascular disease: Secondary | ICD-10-CM | POA: Diagnosis not present

## 2017-08-28 DIAGNOSIS — E119 Type 2 diabetes mellitus without complications: Secondary | ICD-10-CM | POA: Diagnosis not present

## 2017-08-28 DIAGNOSIS — Z7689 Persons encountering health services in other specified circumstances: Secondary | ICD-10-CM | POA: Diagnosis not present

## 2017-08-28 DIAGNOSIS — M24575 Contracture, left foot: Secondary | ICD-10-CM | POA: Diagnosis not present

## 2017-08-28 DIAGNOSIS — E785 Hyperlipidemia, unspecified: Secondary | ICD-10-CM | POA: Diagnosis not present

## 2017-08-28 DIAGNOSIS — I6789 Other cerebrovascular disease: Secondary | ICD-10-CM | POA: Diagnosis not present

## 2017-08-28 DIAGNOSIS — I10 Essential (primary) hypertension: Secondary | ICD-10-CM | POA: Diagnosis not present

## 2017-09-04 DIAGNOSIS — Z5181 Encounter for therapeutic drug level monitoring: Secondary | ICD-10-CM | POA: Diagnosis not present

## 2017-09-04 DIAGNOSIS — R791 Abnormal coagulation profile: Secondary | ICD-10-CM | POA: Diagnosis not present

## 2017-09-04 DIAGNOSIS — Z7901 Long term (current) use of anticoagulants: Secondary | ICD-10-CM | POA: Diagnosis not present

## 2017-09-04 DIAGNOSIS — I6330 Cerebral infarction due to thrombosis of unspecified cerebral artery: Secondary | ICD-10-CM | POA: Diagnosis not present

## 2017-09-09 DIAGNOSIS — I6789 Other cerebrovascular disease: Secondary | ICD-10-CM | POA: Diagnosis not present

## 2017-10-10 DIAGNOSIS — I6789 Other cerebrovascular disease: Secondary | ICD-10-CM | POA: Diagnosis not present

## 2017-10-16 DIAGNOSIS — Z5181 Encounter for therapeutic drug level monitoring: Secondary | ICD-10-CM | POA: Diagnosis not present

## 2017-10-16 DIAGNOSIS — I6330 Cerebral infarction due to thrombosis of unspecified cerebral artery: Secondary | ICD-10-CM | POA: Diagnosis not present

## 2017-10-16 DIAGNOSIS — Z7901 Long term (current) use of anticoagulants: Secondary | ICD-10-CM | POA: Diagnosis not present

## 2017-11-07 DIAGNOSIS — I6789 Other cerebrovascular disease: Secondary | ICD-10-CM | POA: Diagnosis not present

## 2017-11-13 DIAGNOSIS — N763 Subacute and chronic vulvitis: Secondary | ICD-10-CM | POA: Diagnosis not present

## 2017-11-13 DIAGNOSIS — N921 Excessive and frequent menstruation with irregular cycle: Secondary | ICD-10-CM | POA: Diagnosis not present

## 2017-11-13 DIAGNOSIS — Z01411 Encounter for gynecological examination (general) (routine) with abnormal findings: Secondary | ICD-10-CM | POA: Diagnosis not present

## 2017-11-20 DIAGNOSIS — Z5181 Encounter for therapeutic drug level monitoring: Secondary | ICD-10-CM | POA: Diagnosis not present

## 2017-11-20 DIAGNOSIS — R791 Abnormal coagulation profile: Secondary | ICD-10-CM | POA: Diagnosis not present

## 2017-11-20 DIAGNOSIS — Z7901 Long term (current) use of anticoagulants: Secondary | ICD-10-CM | POA: Diagnosis not present

## 2017-11-20 DIAGNOSIS — I6330 Cerebral infarction due to thrombosis of unspecified cerebral artery: Secondary | ICD-10-CM | POA: Diagnosis not present

## 2017-12-04 DIAGNOSIS — I10 Essential (primary) hypertension: Secondary | ICD-10-CM | POA: Diagnosis not present

## 2017-12-04 DIAGNOSIS — M24575 Contracture, left foot: Secondary | ICD-10-CM | POA: Diagnosis not present

## 2017-12-04 DIAGNOSIS — Z0000 Encounter for general adult medical examination without abnormal findings: Secondary | ICD-10-CM | POA: Diagnosis not present

## 2017-12-04 DIAGNOSIS — E119 Type 2 diabetes mellitus without complications: Secondary | ICD-10-CM | POA: Diagnosis not present

## 2017-12-04 DIAGNOSIS — Z0110 Encounter for examination of ears and hearing without abnormal findings: Secondary | ICD-10-CM | POA: Diagnosis not present

## 2017-12-08 DIAGNOSIS — I6789 Other cerebrovascular disease: Secondary | ICD-10-CM | POA: Diagnosis not present

## 2017-12-24 ENCOUNTER — Encounter (HOSPITAL_BASED_OUTPATIENT_CLINIC_OR_DEPARTMENT_OTHER): Payer: Self-pay

## 2017-12-24 ENCOUNTER — Emergency Department (HOSPITAL_BASED_OUTPATIENT_CLINIC_OR_DEPARTMENT_OTHER)
Admission: EM | Admit: 2017-12-24 | Discharge: 2017-12-25 | Disposition: A | Discharge status: Home / Self Care | Payer: Medicare Other | Attending: Emergency Medicine | Admitting: Emergency Medicine

## 2017-12-24 ENCOUNTER — Emergency Department (HOSPITAL_BASED_OUTPATIENT_CLINIC_OR_DEPARTMENT_OTHER): Payer: Medicare Other

## 2017-12-24 ENCOUNTER — Other Ambulatory Visit: Payer: Self-pay

## 2017-12-24 DIAGNOSIS — K2970 Gastritis, unspecified, without bleeding: Secondary | ICD-10-CM | POA: Diagnosis not present

## 2017-12-24 DIAGNOSIS — R079 Chest pain, unspecified: Secondary | ICD-10-CM | POA: Diagnosis not present

## 2017-12-24 DIAGNOSIS — E119 Type 2 diabetes mellitus without complications: Secondary | ICD-10-CM | POA: Insufficient documentation

## 2017-12-24 DIAGNOSIS — Z79899 Other long term (current) drug therapy: Secondary | ICD-10-CM | POA: Diagnosis not present

## 2017-12-24 DIAGNOSIS — Z8673 Personal history of transient ischemic attack (TIA), and cerebral infarction without residual deficits: Secondary | ICD-10-CM | POA: Insufficient documentation

## 2017-12-24 DIAGNOSIS — Z794 Long term (current) use of insulin: Secondary | ICD-10-CM | POA: Diagnosis not present

## 2017-12-24 DIAGNOSIS — I10 Essential (primary) hypertension: Secondary | ICD-10-CM | POA: Insufficient documentation

## 2017-12-24 DIAGNOSIS — Z7901 Long term (current) use of anticoagulants: Secondary | ICD-10-CM | POA: Insufficient documentation

## 2017-12-24 LAB — BASIC METABOLIC PANEL
Anion gap: 12 (ref 5–15)
BUN: 17 mg/dL (ref 6–20)
CO2: 20 mmol/L — ABNORMAL LOW (ref 22–32)
CREATININE: 0.76 mg/dL (ref 0.44–1.00)
Calcium: 9.3 mg/dL (ref 8.9–10.3)
Chloride: 103 mmol/L (ref 101–111)
GFR calc Af Amer: >60 mL/min (ref >60)
GFR calc non Af Amer: >60 mL/min (ref >60)
GLUCOSE: 382 mg/dL — AB (ref 65–99)
Potassium: 3.9 mmol/L (ref 3.5–5.1)
Sodium: 135 mmol/L (ref 135–145)

## 2017-12-24 LAB — CBC
HCT: 45.1 % (ref 36.0–46.0)
Hemoglobin: 16.2 g/dL — ABNORMAL HIGH (ref 12.0–15.0)
MCH: 29.6 pg (ref 26.0–34.0)
MCHC: 35.9 g/dL (ref 30.0–36.0)
MCV: 82.4 fL (ref 78.0–100.0)
PLATELETS: 309 10*3/uL (ref 150–400)
RBC: 5.47 MIL/uL — ABNORMAL HIGH (ref 3.87–5.11)
RDW: 14.8 % (ref 11.5–15.5)
WBC: 4.9 10*3/uL (ref 4.0–10.5)

## 2017-12-24 NOTE — ED Triage Notes (Signed)
CP x 2 days-NAD-presents to triage in own w/c

## 2017-12-25 ENCOUNTER — Encounter (HOSPITAL_BASED_OUTPATIENT_CLINIC_OR_DEPARTMENT_OTHER): Payer: Self-pay | Admitting: Emergency Medicine

## 2017-12-25 DIAGNOSIS — K2970 Gastritis, unspecified, without bleeding: Secondary | ICD-10-CM | POA: Diagnosis not present

## 2017-12-25 LAB — PROTIME-INR
INR: 2.74
Prothrombin Time: 28.8 seconds — ABNORMAL HIGH (ref 11.4–15.2)

## 2017-12-25 LAB — TROPONIN I
Troponin I: <0.03 ng/mL (ref <0.03)
Troponin I: <0.03 ng/mL (ref <0.03)

## 2017-12-25 LAB — D-DIMER, QUANTITATIVE (NOT AT ARMC): D DIMER QUANT: 0.37 ug{FEU}/mL (ref 0.00–0.50)

## 2017-12-25 MED ORDER — OMEPRAZOLE 20 MG PO CPDR: 20.0000 mg | DELAYED_RELEASE_CAPSULE | Freq: Every day | ORAL | 0 refills | Status: DC

## 2017-12-25 MED ORDER — GI COCKTAIL ~~LOC~~
30.0000 mL | Freq: Once | ORAL | Status: AC
  Administered 2017-12-25: 30 mL via ORAL
  Filled 2017-12-25: qty 30

## 2017-12-25 NOTE — ED Provider Notes (Signed)
[facility] EMERGENCY DEPARTMENT Provider Note   CSN: [id number] Arrival date & time: 12/24/17  2201     History   Chief Complaint Chief Complaint  Patient presents with  . Chest Pain    HPI Kimberly [name] is a 57 y.o. female.  The history is provided by the patient.  Chest Pain   This is a new problem. The current episode started 2 days ago. The problem occurs constantly. The problem has not changed since onset.The pain is associated with rest. The pain is present in the substernal region and epigastric region. The pain is moderate. Quality: cannot describe. The pain does not radiate. Pertinent negatives include no abdominal pain, no back pain, no claudication, no cough, no diaphoresis, no dizziness, no fever, no headaches, no hemoptysis, no irregular heartbeat, no leg pain, no nausea, no near-syncope, no numbness, no orthopnea, no palpitations, no PND, no shortness of breath, no syncope and no weakness.  States her husband says it is gas but she does not believe it.  No long car trips or plane trips.  No exertional symptoms.  No f/c/r.  No radiation.  No weakness or numbness.    Past Medical History:  Diagnosis Date  . Diabetes mellitus   . Hypertension   . Seizures ([place])    last seizure march 2016  . Stroke [facility])     Patient Active Problem List   Diagnosis Date Noted  . Spastic hemiplegia affecting nondominant side ([place]) 10/15/2012  . Myalgia and myositis, unspecified 10/15/2012  . Cerebral thrombosis with cerebral infarction ([place]) 07/01/2012  . Localization-related (focal) (partial) epilepsy and epileptic syndromes with complex partial seizures, without mention of intractable epilepsy 07/01/2012  . Extremity pain 07/01/2012  . CVA (cerebral infarction) 02/11/2012  . Contracture of left Achilles tendon 02/11/2012    Past Surgical History:  Procedure Laterality Date  . TRACHEOSTOMY CLOSURE       OB History   None      Home Medications    Prior to  Admission medications   Medication Sig Start Date End Date Taking? Authorizing Provider  baclofen (LIORESAL) 10 MG tablet TAKE 1 TABLET (10 MG TOTAL) BY MOUTH 3 (THREE) TIMES DAILY. 11/14/15   Meredith Staggers, MD  butalbital-acetaminophen-caffeine (FIORICET, ESGIC) [phone] MG per tablet Take 1 tablet by mouth 2 (two) times daily as needed for headache.    [provider]  Canagliflozin (INVOKANA) 300 MG TABS Take 1 tablet by mouth daily.    [provider]  cholecalciferol (VITAMIN D) 1000 UNITS tablet Take 1,000 Units by mouth daily.    [provider]  diclofenac sodium (VOLTAREN) 1 % GEL Apply 1 application topically 4 (four) times daily. 03/12/12   Prueter, Santiago Glad, PA-C  diphenhydrAMINE (BENADRYL) 25 MG tablet Take 1 tablet (25 mg total) by mouth every 6 (six) hours. 10/18/12   Alvina Chou, PA-C  doxycycline (VIBRAMYCIN) 100 MG capsule Take 1 capsule (100 mg total) by mouth 2 (two) times daily. 02/12/15   Fredia Sorrow, MD  fish oil-omega-3 fatty acids 1000 MG capsule Take 1 g by mouth 3 (three) times daily.     [provider]  gabapentin (NEURONTIN) 100 MG capsule Take 400 mg by mouth 3 (three) times daily.     [provider]  glimepiride (AMARYL) 1 MG tablet Take 1 tablet (1 mg total) by mouth daily before breakfast. 04/01/13   Meredith Staggers, MD  hydrocortisone cream 0.5 % Apply topically as needed.    [provider]  insulin aspart (NOVOLOG) 100 UNIT/ML injection Inject 40 Units into the skin 2 (two) times daily with a meal.    [provider]  lacosamide (VIMPAT) 200 MG TABS Take by mouth 2 (two) times daily.      [provider]  metoprolol succinate (TOPROL-XL) 25 MG 24 hr tablet Take 25 mg by mouth daily.      [provider]  norethindrone (AYGESTIN) 5 MG tablet 10 mg daily.  04/13/12   [provider]  omeprazole (PRILOSEC) 20 MG capsule Take 1 capsule (20 mg total) by mouth daily.  12/25/17   Ether Wolters, MD  simvastatin (ZOCOR) 20 MG tablet Take 40 mg by mouth every evening.     [provider]  simvastatin (ZOCOR) 20 MG tablet Take 20 mg by mouth.    [provider]  traMADol (ULTRAM) 50 MG tablet Take 1 tablet (50 mg total) by mouth every 6 (six) hours as needed. 02/12/15   Fredia Sorrow, MD  warfarin (COUMADIN) 10 MG tablet Take 10 mg by mouth daily. Monday and Friday 10 mg and all other days 5 mg    [provider]  zonisamide (ZONEGRAN) 100 MG capsule  10/14/13   [provider]  zonisamide (ZONEGRAN) 100 MG capsule Take 300 mg by mouth. 02/12/14   [provider]    Family History Family History  Problem Relation Age of Onset  . Cancer Father     Social History Social History   Tobacco Use  . Smoking status: Never Smoker  . Smokeless tobacco: Never Used  Substance Use Topics  . Alcohol use: No  . Drug use: No     Allergies   Patient has no known allergies.   Review of Systems Review of Systems  Constitutional: Negative for diaphoresis and fever.  Respiratory: Negative for cough, hemoptysis, chest tightness and shortness of breath.   Cardiovascular: Positive for chest pain. Negative for palpitations, orthopnea, claudication, leg swelling, syncope, PND and near-syncope.  Gastrointestinal: Negative for abdominal pain and nausea.  Musculoskeletal: Negative for back pain.  Neurological: Negative for dizziness, syncope, speech difficulty, weakness, numbness and headaches.  All other systems reviewed and are negative.    Physical Exam Updated Vital Signs BP 131/79   Pulse 91   Temp 98.1 F (36.7 C) (Oral)   Resp 18   Ht 5\' 3"  (1.6 m)   Wt 93.4 kg (206 lb)   SpO2 98%   BMI 36.49 kg/m   Physical Exam  Constitutional: She is oriented to person, place, and time. She appears well-developed and well-nourished. No distress.  HENT:  Head: Normocephalic and atraumatic.  Mouth/Throat: No  oropharyngeal exudate.  Eyes: Pupils are equal, round, and reactive to light. Conjunctivae are normal.  Neck: Normal range of motion. Neck supple.  Cardiovascular: Normal rate, regular rhythm, normal heart sounds and intact distal pulses.  Pulmonary/Chest: Effort normal and breath sounds normal. No stridor. She has no wheezes. She has no rales.  Abdominal: Soft. She exhibits no mass. There is no tenderness. There is no rebound and no guarding.  gassy  Musculoskeletal: Normal range of motion. She exhibits no edema or tenderness.  Neurological: She is alert and oriented to person, place, and time. She displays normal reflexes.  Skin: Skin is warm and dry. Capillary refill takes less than 2 seconds. She is not diaphoretic.  Psychiatric: She has a normal mood and affect.     ED Treatments / Results  Labs (all labs  ordered are listed, but only abnormal results are displayed) Results for orders placed or performed during the hospital encounter of 67/89/38  Basic metabolic panel  Result Value Ref Range   Sodium 135 135 - 145 mmol/L   Potassium 3.9 3.5 - 5.1 mmol/L   Chloride 103 101 - 111 mmol/L   CO2 20 (L) 22 - 32 mmol/L   Glucose, Bld 382 (H) 65 - 99 mg/dL   BUN 17 6 - 20 mg/dL   Creatinine, Ser 0.76 0.44 - 1.00 mg/dL   Calcium 9.3 8.9 - 10.3 mg/dL   GFR calc non Af Amer >60 >60 mL/min   GFR calc Af Amer >60 >60 mL/min   Anion gap 12 5 - 15  CBC  Result Value Ref Range   WBC 4.9 4.0 - 10.5 K/uL   RBC 5.47 (H) 3.87 - 5.11 MIL/uL   Hemoglobin 16.2 (H) 12.0 - 15.0 g/dL   HCT 45.1 36.0 - 46.0 %   MCV 82.4 78.0 - 100.0 fL   MCH 29.6 26.0 - 34.0 pg   MCHC 35.9 30.0 - 36.0 g/dL   RDW 14.8 11.5 - 15.5 %   Platelets 309 150 - 400 K/uL  Troponin I  Result Value Ref Range   Troponin I <0.03 <0.03 ng/mL  Protime-INR  Result Value Ref Range   Prothrombin Time 28.8 (H) 11.4 - 15.2 seconds   INR 2.74   D-dimer, quantitative (not at [facility])  Result Value Ref Range   D-Dimer, Quant 0.37  0.00 - 0.50 ug/mL-FEU  Troponin I  Result Value Ref Range   Troponin I <0.03 <0.03 ng/mL   Dg Chest 2 View  Result Date: 12/24/2017 CLINICAL DATA:  Chest pain EXAM: CHEST - 2 VIEW COMPARISON:  01/23/2012 FINDINGS: The heart size and mediastinal contours are within normal limits. Both lungs are clear. Mild degenerative changes of the spine. IMPRESSION: No active cardiopulmonary disease. Electronically Signed   By: Donavan Foil M.D.   On: 12/24/2017 23:05   ' EKG EKG Interpretation  Date/Time:  Tuesday Luiz Trumpower 30 2019 22:17:59 EDT Ventricular Rate:  109 PR Interval:  170 QRS Duration: 78 QT Interval:  352 QTC Calculation: 474 R Axis:   78 Text Interpretation:  Sinus tachycardia Confirmed by Dory Horn) on 12/25/2017 12:47:19 AM   Radiology Dg Chest 2 View  Result Date: 12/24/2017 CLINICAL DATA:  Chest pain EXAM: CHEST - 2 VIEW COMPARISON:  01/23/2012 FINDINGS: The heart size and mediastinal contours are within normal limits. Both lungs are clear. Mild degenerative changes of the spine. IMPRESSION: No active cardiopulmonary disease. Electronically Signed   By: Donavan Foil M.D.   On: 12/24/2017 23:05    Procedures Procedures (including critical care time)  Medications Ordered in ED Medications  gi cocktail (Maalox,Lidocaine,Donnatal) (30 mLs Oral Given 12/25/17 0130)    Final Clinical Impressions(s) / ED Diagnoses   Final diagnoses:  Gastritis without bleeding, unspecified chronicity, unspecified gastritis type   Ruled out for PE and therapeutic on coumadin.  Heart score is 2.  Ruled out for MI in the ED.  Symptoms consistent with gas and likely gerd.  Will treat for same follow up with your pmd.     Return for weakness, numbness, changes in vision or speech, fevers >100.4 unrelieved by medication, shortness of breath, intractable vomiting, or diarrhea, abdominal pain, Inability to tolerate liquids or food, cough, altered mental status or any concerns. No signs of  systemic illness or infection. The patient is nontoxic-appearing on exam  and vital signs are within normal limits.   I have reviewed the triage vital signs and the nursing notes. Pertinent labs &imaging results that were available during my care of the patient were reviewed by me and considered in my medical decision making (see chart for details).  After history, exam, and medical workup I feel the patient has been appropriately medically screened and is safe for discharge home. Pertinent diagnoses were discussed with the patient. Patient was given return precautions.   ED Discharge Orders        Ordered    omeprazole (PRILOSEC) 20 MG capsule  Daily     12/25/17 0148       Kennedie Pardoe, MD 12/25/17 [phone]

## 2017-12-25 NOTE — ED Notes (Signed)
Pt already used the restroom and is aware that we need a urine sample.

## 2017-12-25 NOTE — ED Notes (Signed)
ED Provider at bedside. 

## 2017-12-25 NOTE — ED Notes (Signed)
Pt and family understood dc material. NAD noted. Script given at dc 

## 2018-01-01 DIAGNOSIS — R2689 Other abnormalities of gait and mobility: Secondary | ICD-10-CM | POA: Diagnosis not present

## 2018-01-01 DIAGNOSIS — Z5181 Encounter for therapeutic drug level monitoring: Secondary | ICD-10-CM | POA: Diagnosis not present

## 2018-01-01 DIAGNOSIS — Z7901 Long term (current) use of anticoagulants: Secondary | ICD-10-CM | POA: Diagnosis not present

## 2018-01-01 DIAGNOSIS — R293 Abnormal posture: Secondary | ICD-10-CM | POA: Diagnosis not present

## 2018-01-01 DIAGNOSIS — I69354 Hemiplegia and hemiparesis following cerebral infarction affecting left non-dominant side: Secondary | ICD-10-CM | POA: Diagnosis not present

## 2018-01-01 DIAGNOSIS — I6330 Cerebral infarction due to thrombosis of unspecified cerebral artery: Secondary | ICD-10-CM | POA: Diagnosis not present

## 2018-01-01 DIAGNOSIS — Z993 Dependence on wheelchair: Secondary | ICD-10-CM | POA: Diagnosis not present

## 2018-01-07 DIAGNOSIS — I6789 Other cerebrovascular disease: Secondary | ICD-10-CM | POA: Diagnosis not present

## 2018-01-08 DIAGNOSIS — N921 Excessive and frequent menstruation with irregular cycle: Secondary | ICD-10-CM | POA: Diagnosis not present

## 2018-01-08 DIAGNOSIS — D219 Benign neoplasm of connective and other soft tissue, unspecified: Secondary | ICD-10-CM | POA: Diagnosis not present

## 2018-01-28 DIAGNOSIS — I10 Essential (primary) hypertension: Secondary | ICD-10-CM | POA: Diagnosis not present

## 2018-01-28 DIAGNOSIS — E119 Type 2 diabetes mellitus without complications: Secondary | ICD-10-CM | POA: Diagnosis not present

## 2018-01-28 DIAGNOSIS — E785 Hyperlipidemia, unspecified: Secondary | ICD-10-CM | POA: Diagnosis not present

## 2018-01-28 DIAGNOSIS — I6789 Other cerebrovascular disease: Secondary | ICD-10-CM | POA: Diagnosis not present

## 2018-02-07 DIAGNOSIS — I6789 Other cerebrovascular disease: Secondary | ICD-10-CM | POA: Diagnosis not present

## 2018-02-14 DIAGNOSIS — M24575 Contracture, left foot: Secondary | ICD-10-CM | POA: Diagnosis not present

## 2018-02-14 DIAGNOSIS — I10 Essential (primary) hypertension: Secondary | ICD-10-CM | POA: Diagnosis not present

## 2018-02-14 DIAGNOSIS — E785 Hyperlipidemia, unspecified: Secondary | ICD-10-CM | POA: Diagnosis not present

## 2018-02-14 DIAGNOSIS — I6789 Other cerebrovascular disease: Secondary | ICD-10-CM | POA: Diagnosis not present

## 2018-02-14 DIAGNOSIS — E119 Type 2 diabetes mellitus without complications: Secondary | ICD-10-CM | POA: Diagnosis not present

## 2018-02-19 DIAGNOSIS — R791 Abnormal coagulation profile: Secondary | ICD-10-CM | POA: Diagnosis not present

## 2018-02-19 DIAGNOSIS — Z5181 Encounter for therapeutic drug level monitoring: Secondary | ICD-10-CM | POA: Diagnosis not present

## 2018-02-19 DIAGNOSIS — I6330 Cerebral infarction due to thrombosis of unspecified cerebral artery: Secondary | ICD-10-CM | POA: Diagnosis not present

## 2018-02-19 DIAGNOSIS — Z7901 Long term (current) use of anticoagulants: Secondary | ICD-10-CM | POA: Diagnosis not present

## 2018-03-03 DIAGNOSIS — I10 Essential (primary) hypertension: Secondary | ICD-10-CM | POA: Diagnosis not present

## 2018-03-03 DIAGNOSIS — M24575 Contracture, left foot: Secondary | ICD-10-CM | POA: Diagnosis not present

## 2018-03-03 DIAGNOSIS — I6789 Other cerebrovascular disease: Secondary | ICD-10-CM | POA: Diagnosis not present

## 2018-03-03 DIAGNOSIS — E119 Type 2 diabetes mellitus without complications: Secondary | ICD-10-CM | POA: Diagnosis not present

## 2018-03-03 DIAGNOSIS — E785 Hyperlipidemia, unspecified: Secondary | ICD-10-CM | POA: Diagnosis not present

## 2018-03-09 DIAGNOSIS — I6789 Other cerebrovascular disease: Secondary | ICD-10-CM | POA: Diagnosis not present

## 2018-03-17 DIAGNOSIS — E785 Hyperlipidemia, unspecified: Secondary | ICD-10-CM | POA: Diagnosis not present

## 2018-03-17 DIAGNOSIS — E119 Type 2 diabetes mellitus without complications: Secondary | ICD-10-CM | POA: Diagnosis not present

## 2018-03-17 DIAGNOSIS — I10 Essential (primary) hypertension: Secondary | ICD-10-CM | POA: Diagnosis not present

## 2018-03-17 DIAGNOSIS — I6789 Other cerebrovascular disease: Secondary | ICD-10-CM | POA: Diagnosis not present

## 2018-03-28 DIAGNOSIS — D2261 Melanocytic nevi of right upper limb, including shoulder: Secondary | ICD-10-CM | POA: Diagnosis not present

## 2018-03-28 DIAGNOSIS — L298 Other pruritus: Secondary | ICD-10-CM | POA: Diagnosis not present

## 2018-03-28 DIAGNOSIS — L815 Leukoderma, not elsewhere classified: Secondary | ICD-10-CM | POA: Diagnosis not present

## 2018-03-28 DIAGNOSIS — L821 Other seborrheic keratosis: Secondary | ICD-10-CM | POA: Diagnosis not present

## 2018-04-09 DIAGNOSIS — I6789 Other cerebrovascular disease: Secondary | ICD-10-CM | POA: Diagnosis not present

## 2018-04-09 DIAGNOSIS — Z7901 Long term (current) use of anticoagulants: Secondary | ICD-10-CM | POA: Diagnosis not present

## 2018-04-09 DIAGNOSIS — Z5181 Encounter for therapeutic drug level monitoring: Secondary | ICD-10-CM | POA: Diagnosis not present

## 2018-04-09 DIAGNOSIS — R791 Abnormal coagulation profile: Secondary | ICD-10-CM | POA: Diagnosis not present

## 2018-04-09 DIAGNOSIS — Z8673 Personal history of transient ischemic attack (TIA), and cerebral infarction without residual deficits: Secondary | ICD-10-CM | POA: Diagnosis not present

## 2018-04-23 DIAGNOSIS — Z5181 Encounter for therapeutic drug level monitoring: Secondary | ICD-10-CM | POA: Diagnosis not present

## 2018-04-23 DIAGNOSIS — R791 Abnormal coagulation profile: Secondary | ICD-10-CM | POA: Diagnosis not present

## 2018-04-23 DIAGNOSIS — Z7901 Long term (current) use of anticoagulants: Secondary | ICD-10-CM | POA: Diagnosis not present

## 2018-04-23 DIAGNOSIS — I6330 Cerebral infarction due to thrombosis of unspecified cerebral artery: Secondary | ICD-10-CM | POA: Diagnosis not present

## 2018-04-25 ENCOUNTER — Other Ambulatory Visit: Payer: Self-pay | Admitting: Physician Assistant

## 2018-04-25 DIAGNOSIS — Z1231 Encounter for screening mammogram for malignant neoplasm of breast: Secondary | ICD-10-CM

## 2018-05-05 DIAGNOSIS — Z7901 Long term (current) use of anticoagulants: Secondary | ICD-10-CM | POA: Diagnosis not present

## 2018-05-05 DIAGNOSIS — R791 Abnormal coagulation profile: Secondary | ICD-10-CM | POA: Diagnosis not present

## 2018-05-05 DIAGNOSIS — I6330 Cerebral infarction due to thrombosis of unspecified cerebral artery: Secondary | ICD-10-CM | POA: Diagnosis not present

## 2018-05-05 DIAGNOSIS — Z5181 Encounter for therapeutic drug level monitoring: Secondary | ICD-10-CM | POA: Diagnosis not present

## 2018-05-14 DIAGNOSIS — H2513 Age-related nuclear cataract, bilateral: Secondary | ICD-10-CM | POA: Diagnosis not present

## 2018-05-14 DIAGNOSIS — H40023 Open angle with borderline findings, high risk, bilateral: Secondary | ICD-10-CM | POA: Diagnosis not present

## 2018-05-14 DIAGNOSIS — H5203 Hypermetropia, bilateral: Secondary | ICD-10-CM | POA: Diagnosis not present

## 2018-05-14 DIAGNOSIS — H524 Presbyopia: Secondary | ICD-10-CM | POA: Diagnosis not present

## 2018-05-23 DIAGNOSIS — I10 Essential (primary) hypertension: Secondary | ICD-10-CM | POA: Diagnosis not present

## 2018-05-23 DIAGNOSIS — E119 Type 2 diabetes mellitus without complications: Secondary | ICD-10-CM | POA: Diagnosis not present

## 2018-05-23 DIAGNOSIS — Z0001 Encounter for general adult medical examination with abnormal findings: Secondary | ICD-10-CM | POA: Diagnosis not present

## 2018-05-23 DIAGNOSIS — Z23 Encounter for immunization: Secondary | ICD-10-CM | POA: Diagnosis not present

## 2018-05-23 DIAGNOSIS — E785 Hyperlipidemia, unspecified: Secondary | ICD-10-CM | POA: Diagnosis not present

## 2018-05-23 DIAGNOSIS — M24575 Contracture, left foot: Secondary | ICD-10-CM | POA: Diagnosis not present

## 2018-05-28 DIAGNOSIS — I6330 Cerebral infarction due to thrombosis of unspecified cerebral artery: Secondary | ICD-10-CM | POA: Diagnosis not present

## 2018-05-28 DIAGNOSIS — Z1231 Encounter for screening mammogram for malignant neoplasm of breast: Secondary | ICD-10-CM | POA: Diagnosis not present

## 2018-05-28 DIAGNOSIS — Z5181 Encounter for therapeutic drug level monitoring: Secondary | ICD-10-CM | POA: Diagnosis not present

## 2018-05-28 DIAGNOSIS — R791 Abnormal coagulation profile: Secondary | ICD-10-CM | POA: Diagnosis not present

## 2018-05-28 DIAGNOSIS — Z7901 Long term (current) use of anticoagulants: Secondary | ICD-10-CM | POA: Diagnosis not present

## 2018-06-05 DIAGNOSIS — E119 Type 2 diabetes mellitus without complications: Secondary | ICD-10-CM | POA: Diagnosis not present

## 2018-06-05 DIAGNOSIS — I10 Essential (primary) hypertension: Secondary | ICD-10-CM | POA: Diagnosis not present

## 2018-06-05 DIAGNOSIS — I6789 Other cerebrovascular disease: Secondary | ICD-10-CM | POA: Diagnosis not present

## 2018-06-13 DIAGNOSIS — M810 Age-related osteoporosis without current pathological fracture: Secondary | ICD-10-CM | POA: Diagnosis not present

## 2018-06-13 DIAGNOSIS — M8589 Other specified disorders of bone density and structure, multiple sites: Secondary | ICD-10-CM | POA: Diagnosis not present

## 2018-06-20 DIAGNOSIS — M24575 Contracture, left foot: Secondary | ICD-10-CM | POA: Diagnosis not present

## 2018-06-20 DIAGNOSIS — I10 Essential (primary) hypertension: Secondary | ICD-10-CM | POA: Diagnosis not present

## 2018-06-20 DIAGNOSIS — I6789 Other cerebrovascular disease: Secondary | ICD-10-CM | POA: Diagnosis not present

## 2018-06-20 DIAGNOSIS — E785 Hyperlipidemia, unspecified: Secondary | ICD-10-CM | POA: Diagnosis not present

## 2018-06-20 DIAGNOSIS — E119 Type 2 diabetes mellitus without complications: Secondary | ICD-10-CM | POA: Diagnosis not present

## 2018-06-25 DIAGNOSIS — Z5181 Encounter for therapeutic drug level monitoring: Secondary | ICD-10-CM | POA: Diagnosis not present

## 2018-06-25 DIAGNOSIS — H401112 Primary open-angle glaucoma, right eye, moderate stage: Secondary | ICD-10-CM | POA: Diagnosis not present

## 2018-06-25 DIAGNOSIS — I6330 Cerebral infarction due to thrombosis of unspecified cerebral artery: Secondary | ICD-10-CM | POA: Diagnosis not present

## 2018-06-25 DIAGNOSIS — H53431 Sector or arcuate defects, right eye: Secondary | ICD-10-CM | POA: Diagnosis not present

## 2018-06-25 DIAGNOSIS — H401121 Primary open-angle glaucoma, left eye, mild stage: Secondary | ICD-10-CM | POA: Diagnosis not present

## 2018-06-25 DIAGNOSIS — Z7901 Long term (current) use of anticoagulants: Secondary | ICD-10-CM | POA: Diagnosis not present

## 2018-07-07 DIAGNOSIS — I6789 Other cerebrovascular disease: Secondary | ICD-10-CM | POA: Diagnosis not present

## 2018-07-07 DIAGNOSIS — G8190 Hemiplegia, unspecified affecting unspecified side: Secondary | ICD-10-CM | POA: Diagnosis not present

## 2018-07-07 DIAGNOSIS — I10 Essential (primary) hypertension: Secondary | ICD-10-CM | POA: Diagnosis not present

## 2018-07-18 ENCOUNTER — Ambulatory Visit (INDEPENDENT_AMBULATORY_CARE_PROVIDER_SITE_OTHER): Payer: Medicare Other | Admitting: Endocrinology

## 2018-07-18 ENCOUNTER — Encounter: Payer: Self-pay | Admitting: Endocrinology

## 2018-07-18 VITALS — BP 130/80 | HR 97 | Ht 63.0 in | Wt 204.8 lb

## 2018-07-18 DIAGNOSIS — E119 Type 2 diabetes mellitus without complications: Secondary | ICD-10-CM

## 2018-07-18 LAB — POCT GLYCOSYLATED HEMOGLOBIN (HGB A1C): Hemoglobin A1C: 13.7 % — AB (ref 4.0–5.6)

## 2018-07-18 MED ORDER — SITAGLIPTIN PHOSPHATE 100 MG PO TABS: 100.0000 mg | ORAL_TABLET | Freq: Every day | ORAL | 11 refills | Status: DC

## 2018-07-18 MED ORDER — METFORMIN HCL ER 500 MG PO TB24: 500.0000 mg | ORAL_TABLET | Freq: Every day | ORAL | 3 refills | Status: DC

## 2018-07-18 MED ORDER — GLIPIZIDE ER 5 MG PO TB24: 5.0000 mg | ORAL_TABLET | Freq: Every day | ORAL | 11 refills | Status: DC

## 2018-07-18 NOTE — Patient Instructions (Addendum)
good diet and exercise significantly improve the control of your diabetes.  please let me know if you wish to be referred to a dietician.  high blood sugar is very risky to your health.  you should see an eye doctor and dentist every year.  It is very important to get all recommended vaccinations.  Controlling your blood pressure and cholesterol drastically reduces the damage diabetes does to your body.  Those who smoke should quit.  Please discuss these with your doctor.  check your blood sugar once a day.  vary the time of day when you check, between before the 3 meals, and at bedtime.  also check if you have symptoms of your blood sugar being too high or too low.  please keep a record of the readings and bring it to your next appointment here (or you can bring the meter itself).  You can write it on any piece of paper.  please call us sooner if your blood sugar goes below 70, or if you have a lot of readings over 200.  I have sent a prescription to your pharmacy, to add extended-release metformin. Please call or message [place] next week, to tell us how the blood sugar is doing.  If we need to, we can add "invokana." Please come back for a follow-up appointment in 3 weeks.

## 2018-07-18 NOTE — Progress Notes (Signed)
Subjective:    Patient ID: Kimberly [name], female    DOB: 1961-03-21, 57 y.o.   MRN: [id number]  HPI pt is referred by Dr Sunny Schlein, for diabetes.  Pt states DM was dx'ed in 2011; she has mild if any neuropathy of the lower extremities; she has associated CVA; she took insulin 2011-2017; she stopped insulin due to n/v, which she attributed to the insulin; pt says her diet is fair, but exercise is limited by CVA; she has never had GDM, pancreatitis, pancreatic surgery, severe hypoglycemia or DKA.  She takes glipizide and [place].  She stopped metformin (nausea).  She declines to resume insulin.  Meter is downloaded today, and the printout is scanned into the record.  cbg varies from 292-395.  There is no trend throughout the day.  Past Medical History:  Diagnosis Date  . Diabetes mellitus   . Hypertension   . Seizures ([place])    last seizure march 2016  . Stroke [facility])     Past Surgical History:  Procedure Laterality Date  . TRACHEOSTOMY CLOSURE      Social History   Socioeconomic History  . Marital status: Married    Spouse name: Not on file  . Number of children: Not on file  . Years of education: Not on file  . Highest education level: Not on file  Occupational History  . Not on file  Social Needs  . Financial resource strain: Not on file  . Food insecurity:    Worry: Not on file    Inability: Not on file  . Transportation needs:    Medical: Not on file    Non-medical: Not on file  Tobacco Use  . Smoking status: Never Smoker  . Smokeless tobacco: Never Used  Substance and Sexual Activity  . Alcohol use: No  . Drug use: No  . Sexual activity: Not on file  Lifestyle  . Physical activity:    Days per week: Not on file    Minutes per session: Not on file  . Stress: Not on file  Relationships  . Social connections:    Talks on phone: Not on file    Gets together: Not on file    Attends religious service: Not on file    Active member of club or organization: Not on file   Attends meetings of clubs or organizations: Not on file    Relationship status: Not on file  . Intimate partner violence:    Fear of current or ex partner: Not on file    Emotionally abused: Not on file    Physically abused: Not on file    Forced sexual activity: Not on file  Other Topics Concern  . Not on file  Social History Narrative  . Not on file    Current Outpatient Medications on File Prior to Visit  Medication Sig Dispense Refill  . baclofen (LIORESAL) 10 MG tablet TAKE 1 TABLET (10 MG TOTAL) BY MOUTH 3 (THREE) TIMES DAILY. 90 tablet 3  . cholecalciferol (VITAMIN D) 1000 UNITS tablet Take 1,000 Units by mouth daily.    . diclofenac sodium (VOLTAREN) 1 % GEL Apply 1 application topically 4 (four) times daily. 2 Tube 2  . fish oil-omega-3 fatty acids 1000 MG capsule Take 1 g by mouth 3 (three) times daily.     . hydrocortisone cream 0.5 % Apply topically as needed.    . lacosamide (VIMPAT) 200 MG TABS Take by mouth 2 (two) times daily.      Marland Kitchen  simvastatin (ZOCOR) 20 MG tablet Take 40 mg by mouth every evening.     . warfarin (COUMADIN) 10 MG tablet Take 10 mg by mouth daily. Monday and Friday 10 mg and all other days 5 mg    . zonisamide (ZONEGRAN) 100 MG capsule Take 300 mg by mouth.    . butalbital-acetaminophen-caffeine (FIORICET, ESGIC) 50-325-40 MG per tablet Take 1 tablet by mouth 2 (two) times daily as needed for headache.    . diphenhydrAMINE (BENADRYL) 25 MG tablet Take 1 tablet (25 mg total) by mouth every 6 (six) hours. (Patient not taking: Reported on 07/18/2018) 20 tablet 0  . gabapentin (NEURONTIN) 100 MG capsule Take 400 mg by mouth 3 (three) times daily.     . metoprolol succinate (TOPROL-XL) 25 MG 24 hr tablet Take 25 mg by mouth daily.      . norethindrone (AYGESTIN) 5 MG tablet 10 mg daily.     Marland Kitchen omeprazole (PRILOSEC) 20 MG capsule Take 1 capsule (20 mg total) by mouth daily. (Patient not taking: Reported on 07/18/2018) 30 capsule 0  . simvastatin (ZOCOR) 20 MG  tablet Take 20 mg by mouth.    . traMADol (ULTRAM) 50 MG tablet Take 1 tablet (50 mg total) by mouth every 6 (six) hours as needed. (Patient not taking: Reported on 07/18/2018) 15 tablet 0   No current facility-administered medications on file prior to visit.     No Known Allergies  Family History  Problem Relation Age of Onset  . Cancer Father   . Diabetes Neg Hx     BP 130/80 (BP Location: Right Arm, Patient Position: Sitting, Cuff Size: Normal)   Pulse 97   Ht 5\' 3"  (1.6 m)   Wt 204 lb 12.8 oz (92.9 kg)   SpO2 95%   BMI 36.28 kg/m     Review of Systems denies weight loss, blurry vision, headache, chest pain, sob, n/v, muscle cramps, excessive diaphoresis, memory loss, depression, cold intolerance, rhinorrhea, and easy bruising.  She has urinary frequency.       Objective:   Physical Exam VS: see vs page GEN: no distress.  In wheelchair HEAD: head: no deformity eyes: no periorbital swelling, no proptosis external nose and ears are normal mouth: no lesion seen NECK: supple, thyroid is not enlarged CHEST WALL: no deformity LUNGS: clear to auscultation CV: reg rate and rhythm, no murmur ABD: abdomen is soft, nontender.  no hepatosplenomegaly.  not distended.  no hernia.   MUSCULOSKELETAL: muscle bulk and strength are grossly normal on the right side.  Strength is 4/5 on the left face, 1/5 on the LUE, and 3/5 on the LLE.  no obvious joint swelling.  gait is normal and steady.   EXTEMITIES: no deformity.  no ulcer on the feet.  feet are of normal color and temp.  Trace bilat leg edema.   PULSES: dorsalis pedis intact bilat.  no carotid bruit.   NEURO:  cn 2-12 grossly intact.   readily moves all 4's.  sensation is intact to touch on the feet.   SKIN:  Normal texture and temperature.  No rash or suspicious lesion is visible.   NODES:  None palpable at the neck.   PSYCH: alert, well-oriented.  Does not appear anxious nor depressed.   outside test results are reviewed:    A1c=9.8%  I have reviewed outside records, and summarized: Pt was noted to have elevated a1c, and referred here.  Note says pt was rx'ed basaglar, but was requiring samples in  order to take      Assessment & Plan:  type 2 DM, with CVA: new to me.  She is unlikely to achieve good glycemic control without resuming insulin, but she declines to take it for now.    Patient Instructions  good diet and exercise significantly improve the control of your diabetes.  please let me know if you wish to be referred to a dietician.  high blood sugar is very risky to your health.  you should see an eye doctor and dentist every year.  It is very important to get all recommended vaccinations.  Controlling your blood pressure and cholesterol drastically reduces the damage diabetes does to your body.  Those who smoke should quit.  Please discuss these with your doctor.  check your blood sugar once a day.  vary the time of day when you check, between before the 3 meals, and at bedtime.  also check if you have symptoms of your blood sugar being too high or too low.  please keep a record of the readings and bring it to your next appointment here (or you can bring the meter itself).  You can write it on any piece of paper.  please call us sooner if your blood sugar goes below 70, or if you have a lot of readings over 200.  I have sent a prescription to your pharmacy, to add extended-release metformin. Please call or message [place] next week, to tell us how the blood sugar is doing.  If we need to, we can add "invokana." Please come back for a follow-up appointment in 3 weeks.

## 2018-07-28 DIAGNOSIS — E785 Hyperlipidemia, unspecified: Secondary | ICD-10-CM | POA: Diagnosis not present

## 2018-07-28 DIAGNOSIS — M24575 Contracture, left foot: Secondary | ICD-10-CM | POA: Diagnosis not present

## 2018-07-28 DIAGNOSIS — I10 Essential (primary) hypertension: Secondary | ICD-10-CM | POA: Diagnosis not present

## 2018-07-28 DIAGNOSIS — I6789 Other cerebrovascular disease: Secondary | ICD-10-CM | POA: Diagnosis not present

## 2018-07-28 DIAGNOSIS — E119 Type 2 diabetes mellitus without complications: Secondary | ICD-10-CM | POA: Diagnosis not present

## 2018-07-30 ENCOUNTER — Ambulatory Visit: Payer: Medicare Other | Admitting: Podiatry

## 2018-07-30 DIAGNOSIS — Z7901 Long term (current) use of anticoagulants: Secondary | ICD-10-CM | POA: Diagnosis not present

## 2018-07-30 DIAGNOSIS — Z5181 Encounter for therapeutic drug level monitoring: Secondary | ICD-10-CM | POA: Diagnosis not present

## 2018-07-30 DIAGNOSIS — I6330 Cerebral infarction due to thrombosis of unspecified cerebral artery: Secondary | ICD-10-CM | POA: Diagnosis not present

## 2018-07-31 ENCOUNTER — Encounter: Payer: Self-pay | Admitting: Podiatry

## 2018-07-31 ENCOUNTER — Ambulatory Visit (INDEPENDENT_AMBULATORY_CARE_PROVIDER_SITE_OTHER): Payer: Medicare Other | Admitting: Podiatry

## 2018-07-31 VITALS — BP 120/82

## 2018-07-31 DIAGNOSIS — B351 Tinea unguium: Secondary | ICD-10-CM | POA: Diagnosis not present

## 2018-07-31 DIAGNOSIS — E1142 Type 2 diabetes mellitus with diabetic polyneuropathy: Secondary | ICD-10-CM

## 2018-07-31 DIAGNOSIS — M79675 Pain in left toe(s): Secondary | ICD-10-CM

## 2018-07-31 DIAGNOSIS — L600 Ingrowing nail: Secondary | ICD-10-CM | POA: Diagnosis not present

## 2018-07-31 DIAGNOSIS — M79674 Pain in right toe(s): Secondary | ICD-10-CM | POA: Diagnosis not present

## 2018-07-31 NOTE — Patient Instructions (Signed)

## 2018-08-06 DIAGNOSIS — H401121 Primary open-angle glaucoma, left eye, mild stage: Secondary | ICD-10-CM | POA: Diagnosis not present

## 2018-08-06 DIAGNOSIS — H401112 Primary open-angle glaucoma, right eye, moderate stage: Secondary | ICD-10-CM | POA: Diagnosis not present

## 2018-08-11 ENCOUNTER — Ambulatory Visit: Payer: Medicare Other | Admitting: Endocrinology

## 2018-08-11 ENCOUNTER — Ambulatory Visit (INDEPENDENT_AMBULATORY_CARE_PROVIDER_SITE_OTHER): Payer: Medicare Other | Admitting: Endocrinology

## 2018-08-11 ENCOUNTER — Encounter: Payer: Self-pay | Admitting: Endocrinology

## 2018-08-11 DIAGNOSIS — Z794 Long term (current) use of insulin: Secondary | ICD-10-CM

## 2018-08-11 DIAGNOSIS — E1149 Type 2 diabetes mellitus with other diabetic neurological complication: Secondary | ICD-10-CM

## 2018-08-11 MED ORDER — INSULIN DEGLUDEC 200 UNIT/ML ~~LOC~~ SOPN: 20.0000 [IU] | PEN_INJECTOR | Freq: Every day | SUBCUTANEOUS | 11 refills | Status: DC

## 2018-08-11 MED ORDER — GLUCOSE BLOOD VI STRP: 1.0000 | ORAL_STRIP | Freq: Two times a day (BID) | 12 refills | Status: DC

## 2018-08-11 NOTE — Patient Instructions (Addendum)
check your blood sugar once a day.  vary the time of day when you check, between before the 3 meals, and at bedtime.  also check if you have symptoms of your blood sugar being too high or too low.  please keep a record of the readings and bring it to your next appointment here (or you can bring the meter itself).  You can write it on any piece of paper.  please call us sooner if your blood sugar goes below 70, or if you have a lot of readings over 200.  I have sent a prescription to your pharmacy, to add "tresiba." Please continue the same diabetes pills. Please come back for a follow-up appointment in 3-4 weeks.

## 2018-08-11 NOTE — Progress Notes (Signed)
Subjective:    Patient ID: Kimberly [name], female    DOB: Aug 29, 1960, 57 y.o.   MRN: [id number]  HPI Pt returns for f/u of diabetes mellitus: DM type: 2 Dx'ed: 9622 Complications: CVA Therapy: 3 oral meds GDM: never DKA: never Severe hypoglycemia: never Pancreatitis: never Pancreatic imaging:  Other: she took insulin 2011-2017; she stopped insulin due to n/v, which she attributed to the insulin.  she declines to resume.   Interval history: Meter is downloaded today, and the printout is scanned into the record.  cbg varies from 287-506.  pt states she feels well in general, except for frequent urination.  Past Medical History:  Diagnosis Date  . Diabetes mellitus   . Hypertension   . Seizures ([place])    last seizure march 2016  . Stroke [facility])     Past Surgical History:  Procedure Laterality Date  . TRACHEOSTOMY CLOSURE      Social History   Socioeconomic History  . Marital status: Married    Spouse name: Not on file  . Number of children: Not on file  . Years of education: Not on file  . Highest education level: Not on file  Occupational History  . Not on file  Social Needs  . Financial resource strain: Not on file  . Food insecurity:    Worry: Not on file    Inability: Not on file  . Transportation needs:    Medical: Not on file    Non-medical: Not on file  Tobacco Use  . Smoking status: Never Smoker  . Smokeless tobacco: Never Used  Substance and Sexual Activity  . Alcohol use: No  . Drug use: No  . Sexual activity: Not on file  Lifestyle  . Physical activity:    Days per week: Not on file    Minutes per session: Not on file  . Stress: Not on file  Relationships  . Social connections:    Talks on phone: Not on file    Gets together: Not on file    Attends religious service: Not on file    Active member of club or organization: Not on file    Attends meetings of clubs or organizations: Not on file    Relationship status: Not on file  . Intimate  partner violence:    Fear of current or ex partner: Not on file    Emotionally abused: Not on file    Physically abused: Not on file    Forced sexual activity: Not on file  Other Topics Concern  . Not on file  Social History Narrative  . Not on file    Current Outpatient Medications on File Prior to Visit  Medication Sig Dispense Refill  . baclofen (LIORESAL) 10 MG tablet TAKE 1 TABLET (10 MG TOTAL) BY MOUTH 3 (THREE) TIMES DAILY. 90 tablet 3  . baclofen (LIORESAL) 20 MG tablet Take 20 mg by mouth 3 (three) times daily.  2  . butalbital-acetaminophen-caffeine (FIORICET, ESGIC) 50-325-40 MG per tablet Take 1 tablet by mouth 2 (two) times daily as needed for headache.    . cholecalciferol (VITAMIN D) 1000 UNITS tablet Take 1,000 Units by mouth daily.    . diclofenac sodium (VOLTAREN) 1 % GEL Apply 1 application topically 4 (four) times daily. 2 Tube 2  . diphenhydrAMINE (BENADRYL) 25 MG tablet Take 1 tablet (25 mg total) by mouth every 6 (six) hours. 20 tablet 0  . fish oil-omega-3 fatty acids 1000 MG capsule Take 1 g by mouth  3 (three) times daily.     Marland Kitchen gabapentin (NEURONTIN) 100 MG capsule Take 400 mg by mouth 3 (three) times daily.     Marland Kitchen glipiZIDE (GLUCOTROL XL) 5 MG 24 hr tablet Take 1 tablet (5 mg total) by mouth daily with breakfast. 30 tablet 11  . hydrocortisone cream 0.5 % Apply topically as needed.    . lacosamide (VIMPAT) 200 MG TABS Take by mouth 2 (two) times daily.      Marland Kitchen latanoprost (XALATAN) 0.005 % ophthalmic solution INSTILL 1 DROP(S) IN EACH EYE DAILY IN THE EVENING    . latanoprost (XALATAN) 0.005 % ophthalmic solution INSTILL 1 DROP(S) IN EACH EYE DAILY IN THE EVENING  6  . metFORMIN (GLUCOPHAGE-XR) 500 MG 24 hr tablet Take 1 tablet (500 mg total) by mouth daily with breakfast. 90 tablet 3  . metoprolol succinate (TOPROL-XL) 25 MG 24 hr tablet Take 25 mg by mouth daily.      . norethindrone (AYGESTIN) 5 MG tablet 10 mg daily.     Marland Kitchen omeprazole (PRILOSEC) 20 MG capsule  Take 1 capsule (20 mg total) by mouth daily. 30 capsule 0  . simvastatin (ZOCOR) 20 MG tablet Take 40 mg by mouth every evening.     . simvastatin (ZOCOR) 20 MG tablet Take 20 mg by mouth.    . sitaGLIPtin (JANUVIA) 100 MG tablet Take 1 tablet (100 mg total) by mouth daily. 30 tablet 11  . traMADol (ULTRAM) 50 MG tablet Take 1 tablet (50 mg total) by mouth every 6 (six) hours as needed. 15 tablet 0  . warfarin (COUMADIN) 10 MG tablet Take 10 mg by mouth daily. Monday and Friday 10 mg and all other days 5 mg    . zonisamide (ZONEGRAN) 100 MG capsule Take 300 mg by mouth.     No current facility-administered medications on file prior to visit.     No Known Allergies  Family History  Problem Relation Age of Onset  . Cancer Father   . Diabetes Neg Hx     BP (!) 144/82 (BP Location: Right Arm, Patient Position: Sitting, Cuff Size: Normal)   Pulse (!) 110   Ht 5\' 3"  (1.6 m)   Wt 207 lb 12.8 oz (94.3 kg)   SpO2 96%   BMI 36.81 kg/m    Review of Systems Denies n/v/sob.  She has gained 3 lbs, since last ov.      Objective:   Physical Exam VITAL SIGNS:  See vs page GENERAL: no distress.  In wheelchair Pulses: dorsalis pedis intact bilat.   MSK: no deformity of the feet CV: no leg edema Skin:  no ulcer on the feet.  normal color and temp on the feet. Neuro: sensation is intact to touch on the feet   Lab Results  Component Value Date   HGBA1C 13.7 (A) 07/18/2018   Lab Results  Component Value Date   CREATININE 0.76 12/24/2017   BUN 17 12/24/2017   NA 135 12/24/2017   K 3.9 12/24/2017   CL 103 12/24/2017   CO2 20 (L) 12/24/2017       Assessment & Plan:  Type 2 DM, with CVA: worse  We discussed.  Pt agrees to take insulin.  Frequent urination: not accompanied by weight loss. We'll follow  Patient Instructions  check your blood sugar once a day.  vary the time of day when you check, between before the 3 meals, and at bedtime.  also check if you have symptoms of your  blood sugar being too high or too low.  please keep a record of the readings and bring it to your next appointment here (or you can bring the meter itself).  You can write it on any piece of paper.  please call us sooner if your blood sugar goes below 70, or if you have a lot of readings over 200.  I have sent a prescription to your pharmacy, to add "tresiba." Please continue the same diabetes pills. Please come back for a follow-up appointment in 3-4 weeks.

## 2018-08-12 ENCOUNTER — Other Ambulatory Visit: Payer: Self-pay

## 2018-08-12 ENCOUNTER — Telehealth: Payer: Self-pay | Admitting: Endocrinology

## 2018-08-12 DIAGNOSIS — E119 Type 2 diabetes mellitus without complications: Secondary | ICD-10-CM | POA: Insufficient documentation

## 2018-08-12 MED ORDER — INSULIN PEN NEEDLE 31G X 5 MM MISC: 1.0000 | Freq: Every day | 3 refills | Status: DC

## 2018-08-12 NOTE — Telephone Encounter (Signed)
Rx for pen needles has been sent to [facility] as well.

## 2018-08-12 NOTE — Telephone Encounter (Signed)
Patient states when she picked up her RX for Tresiba Flex Touch there were no needles prescribed. Please send RX for needles for the [facility] [place] [facility] [address].

## 2018-08-18 ENCOUNTER — Telehealth: Payer: Self-pay | Admitting: Endocrinology

## 2018-08-18 ENCOUNTER — Other Ambulatory Visit: Payer: Self-pay

## 2018-08-18 MED ORDER — SITAGLIPTIN PHOSPHATE 100 MG PO TABS: 100.0000 mg | ORAL_TABLET | Freq: Every day | ORAL | 11 refills | Status: DC

## 2018-08-18 NOTE — Telephone Encounter (Signed)
MEDICATION: sitaGLIPtin ([place]) AND glipiZIDE (GLUCOTROL XL) 5 MG 24 hr tablet  PHARMACY[facility]  [facility] [address] [facility] [place] [facility] Yes  IS PATIENT OUT OF MEDICATION: Yes  IF NOT; HOW MUCH IS LEFT: None  LAST APPOINTMENT DATE: @12 /17/2019  NEXT APPOINTMENT DATE:@1 /15/2020  DO WE HAVE YOUR PERMISSION TO LEAVE A DETAILED MESSAGE:Yes  OTHER COMMENTS:    **Let patient know to contact pharmacy at the end of the day to make sure medication is ready. **  ** Please notify patient to allow 48-72 hours to process**  **Encourage patient to contact the pharmacy for refills or they can request refills through [facility]**

## 2018-08-18 NOTE — Telephone Encounter (Signed)
[facility] was [facility] [place] [facility] refills please have pt contact pharmacy

## 2018-08-18 NOTE — Telephone Encounter (Signed)
Refill sent.

## 2018-08-18 NOTE — Telephone Encounter (Signed)
Both RX's that were sent to Pharmacy are showing up as No Print. Please resend.

## 2018-08-21 ENCOUNTER — Telehealth: Payer: Self-pay | Admitting: Endocrinology

## 2018-08-21 ENCOUNTER — Other Ambulatory Visit: Payer: Self-pay

## 2018-08-21 MED ORDER — GLIPIZIDE ER 5 MG PO TB24: 5.0000 mg | ORAL_TABLET | Freq: Every day | ORAL | 11 refills | Status: DC

## 2018-08-21 NOTE — Telephone Encounter (Signed)
MEDICATION: glipiZIDE (GLUCOTROL XL) 5 MG 24 hr tablet  PHARMACY[facility]  [facility] [address] in [place] [facility] Yes  IS PATIENT OUT OF MEDICATION: Yes  IF NOT; HOW MUCH IS LEFT: None  LAST APPOINTMENT DATE: @12 /23/2019  NEXT APPOINTMENT DATE:@1 /15/2020  DO WE HAVE YOUR PERMISSION TO LEAVE A DETAILED MESSAGE: Yes  OTHER COMMENTS:  Patient had previously requested the above medication at the same time as the request for Januvia but patient states she only able to pick the Januvia not the Glipizide  **Let patient know to contact pharmacy at the end of the day to make sure medication is ready. **  ** Please notify patient to allow 48-72 hours to process**  **Encourage patient to contact the pharmacy for refills or they can request refills through [facility]**

## 2018-08-21 NOTE — Telephone Encounter (Signed)
rf sent 

## 2018-08-30 ENCOUNTER — Encounter: Payer: Self-pay | Admitting: Podiatry

## 2018-08-30 NOTE — Progress Notes (Signed)
Subjective: Kimberly [name] presents today referred by [name], MD with h/o diabetes and CVA with LLE paralysis.  Pain is aggravated when wearing enclosed shoe gear.   She relates stinging in right great toe and left foot. She was using her motorized chair and hit her couch. She relates blood clot under toenail.  Past Medical History:  Diagnosis Date  . Diabetes mellitus   . Hypertension   . Seizures ([place])    last seizure march 2016  . Stroke [facility])     Patient Active Problem List   Diagnosis Date Noted  . Diabetes ([place]) 08/12/2018  . Palpitations 04/16/2017  . Shortness of breath 04/16/2017  . Abnormal INR 01/04/2016  . Encounter for therapeutic drug level monitoring 08/31/2015  . Long term current use of anticoagulant therapy 08/31/2015  . Spastic hemiplegia affecting nondominant side ([place]) 10/15/2012  . Myalgia and myositis, unspecified 10/15/2012  . Cerebral thrombosis with cerebral infarction ([place]) 07/01/2012  . Localization-related (focal) (partial) epilepsy and epileptic syndromes with complex partial seizures, without mention of intractable epilepsy 07/01/2012  . Extremity pain 07/01/2012  . Partial epilepsy with impairment of consciousness, intractable ([place]) 07/01/2012  . CVA (cerebral infarction) 02/11/2012  . Contracture of left Achilles tendon 02/11/2012    Past Surgical History:  Procedure Laterality Date  . TRACHEOSTOMY CLOSURE      Current Outpatient Medications:  .  baclofen (LIORESAL) 10 MG tablet, TAKE 1 TABLET (10 MG TOTAL) BY MOUTH 3 (THREE) TIMES DAILY., Disp: 90 tablet, Rfl: 3 .  baclofen (LIORESAL) 20 MG tablet, Take 20 mg by mouth 3 (three) times daily., Disp: , Rfl: 2 .  butalbital-acetaminophen-caffeine (FIORICET, ESGIC) 50-325-40 MG per tablet, Take 1 tablet by mouth 2 (two) times daily as needed for headache., Disp: , Rfl:  .  cholecalciferol (VITAMIN D) 1000 UNITS tablet, Take 1,000 Units by mouth daily., Disp: , Rfl:  .  diclofenac sodium  (VOLTAREN) 1 % GEL, Apply 1 application topically 4 (four) times daily., Disp: 2 Tube, Rfl: 2 .  diphenhydrAMINE (BENADRYL) 25 MG tablet, Take 1 tablet (25 mg total) by mouth every 6 (six) hours., Disp: 20 tablet, Rfl: 0 .  fish oil-omega-3 fatty acids 1000 MG capsule, Take 1 g by mouth 3 (three) times daily. , Disp: , Rfl:  .  gabapentin (NEURONTIN) 100 MG capsule, Take 400 mg by mouth 3 (three) times daily. , Disp: , Rfl:  .  hydrocortisone cream 0.5 %, Apply topically as needed., Disp: , Rfl:  .  lacosamide (VIMPAT) 200 MG TABS, Take by mouth 2 (two) times daily.  , Disp: , Rfl:  .  latanoprost (XALATAN) 0.005 % ophthalmic solution, INSTILL 1 DROP(S) IN [facility] EYE DAILY IN THE EVENING, Disp: , Rfl:  .  latanoprost (XALATAN) 0.005 % ophthalmic solution, INSTILL 1 DROP(S) IN [facility] EYE DAILY IN THE EVENING, Disp: , Rfl: 6 .  metFORMIN (GLUCOPHAGE-XR) 500 MG 24 hr tablet, Take 1 tablet (500 mg total) by mouth daily with breakfast., Disp: 90 tablet, Rfl: 3 .  metoprolol succinate (TOPROL-XL) 25 MG 24 hr tablet, Take 25 mg by mouth daily.  , Disp: , Rfl:  .  norethindrone (AYGESTIN) 5 MG tablet, 10 mg daily. , Disp: , Rfl:  .  omeprazole (PRILOSEC) 20 MG capsule, Take 1 capsule (20 mg total) by mouth daily., Disp: 30 capsule, Rfl: 0 .  simvastatin (ZOCOR) 20 MG tablet, Take 40 mg by mouth every evening. , Disp: , Rfl:  .  simvastatin (ZOCOR) 20 MG tablet,  Take 20 mg by mouth., Disp: , Rfl:  .  traMADol (ULTRAM) 50 MG tablet, Take 1 tablet (50 mg total) by mouth every 6 (six) hours as needed., Disp: 15 tablet, Rfl: 0 .  warfarin (COUMADIN) 10 MG tablet, Take 10 mg by mouth daily. Monday and Friday 10 mg and all other days 5 mg, Disp: , Rfl:  .  zonisamide (ZONEGRAN) 100 MG capsule, Take 300 mg by mouth., Disp: , Rfl:  .  glipiZIDE (GLUCOTROL XL) 5 MG 24 hr tablet, Take 1 tablet (5 mg total) by mouth daily with breakfast., Disp: 30 tablet, Rfl: 11 .  glucose blood (ACCU-CHEK GUIDE) test strip, 1 each by  Other route 2 (two) times daily. And lancets 2/day, Disp: 100 each, Rfl: 12 .  Insulin Degludec (TRESIBA FLEXTOUCH) 200 UNIT/ML SOPN, Inject 20 Units into the skin daily. And pen needles 1/day, Disp: 3 pen, Rfl: 11 .  Insulin Pen Needle (B-D UF III MINI PEN NEEDLES) 31G X 5 MM MISC, Inject 1 each into the skin daily. Use to inject insulin SQ daily; E11.9, Disp: 100 each, Rfl: 3 .  sitaGLIPtin (JANUVIA) 100 MG tablet, Take 1 tablet (100 mg total) by mouth daily., Disp: 30 tablet, Rfl: 11  No Known Allergies  Social History   Occupational History  . Not on file  Tobacco Use  . Smoking status: Never Smoker  . Smokeless tobacco: Never Used  Substance and Sexual Activity  . Alcohol use: No  . Drug use: No  . Sexual activity: Not on file    Family History  Problem Relation Age of Onset  . Cancer Father   . Diabetes Neg Hx      There is no immunization history on file for this patient.   Review of systems: Positive Findings in bold print.  Constitutional:  chills, fatigue, fever, sweats, weight change Communication: Optometrist, sign Ecologist, hand writing, iPad/Android device Head: headaches, head injury Eyes: changes in vision, eye pain, glaucoma, cataracts, macular degeneration, diplopia, glare,  light sensitivity, eyeglasses or contacts, blindness Ears nose mouth throat: Hard of hearing, ringing in ears, deaf, sign language,  vertigo,   nosebleeds,  rhinitis,  cold sores, snoring, swollen glands Cardiovascular: HTN, edema, arrhythmia, pacemaker in place, defibrillator in place,  chest pain/tightness, chronic anticoagulation, blood clot, heart failure Peripheral Vascular: leg cramps, varicose veins, blood clots, lymphedema Respiratory:  difficulty breathing, denies congestion, SOB, wheezing, cough, emphysema Gastrointestinal: change in appetite or weight, abdominal pain, constipation, diarrhea, nausea, vomiting, vomiting blood, change in bowel habits, abdominal pain,  jaundice, rectal bleeding, hemorrhoids, Genitourinary:  nocturia,  pain on urination,  blood in urine, Foley catheter, urinary urgency Musculoskeletal: uses mobility aid,  cramping, stiff joints, painful joints, decreased joint motion, fractures, OA, gout Skin: +changes in toenails, color change, dryness, itching, mole changes,  rash  Neurological: headaches, numbness in feet, paresthesias in feet, burning in feet, fainting,  seizures, change in speech.  headaches, memory problems/poor historian, cerebral palsy, weakness, paralysis, stroke Endocrine: diabetes, hypothyroidism, hyperthyroidism,  goiter, dry mouth, flushing, heat intolerance,  cold intolerance,  excessive thirst, denies polyuria,  nocturia Hematological:  easy bleeding, excessive bleeding, easy bruising, enlarged lymph nodes, on long term blood thinner, history of past transusions Allergy/immunological:  hives, eczema, frequent infections, multiple drug allergies, seasonal allergies, transplant recipient Psychiatric:  anxiety, depression, mood disorder, suicidal ideations, hallucinations  Objective: Vascular Examination: Capillary refill time immediate x 10 digits Dorsalis pedis palpable b/l Posterior tibial pulses palpable b/l No digital hair x 10  digits Skin temperature gradient WNL b/l  Dermatological Examination: Skin with normal turgor, texture and tone b/l  Toenails 1-5 b/l discolored, thick, dystrophic with subungual debris and pain with palpation to nailbeds due to thickness of nails. Incurvated nailplates b/l great toes with nail border hypertrophy. No edema, no erythema, no drainage.  Musculoskeletal: Muscle strength 5/5 to RLE muscle groups  Flaccid LLE secondary to CVA  Neurological: Sensation intact with 10 gram monofilament RLE; diminished LLE Vibratory sensation intact RLE, diminished LLE.  Assessment: 1. Painful onychomycosis toenails 1-5 b/l  2. Ingrown nails b/l great toes 3. NIDDM with  neuropathy  Plan: 1. Discussed diabetic foot care principles. Literature dispensed on today. 2. Toenails 1-5 b/l were debrided in length and girth without iatrogenic bleeding. Offending nail borders debrided and curretaged. Borders cleansed with alcohol. 3. Patient to continue soft, supportive shoe gear 4. Patient to report any pedal injuries to medical professional immediately. 5. Follow up 3 months. Patient/POA to call should there be a concern in the interim.

## 2018-09-03 DIAGNOSIS — I6990 Unspecified sequelae of unspecified cerebrovascular disease: Secondary | ICD-10-CM | POA: Diagnosis not present

## 2018-09-10 ENCOUNTER — Ambulatory Visit: Payer: Medicare Other | Admitting: Endocrinology

## 2018-09-10 ENCOUNTER — Encounter: Payer: Self-pay | Admitting: Endocrinology

## 2018-09-10 ENCOUNTER — Ambulatory Visit (INDEPENDENT_AMBULATORY_CARE_PROVIDER_SITE_OTHER): Payer: Medicare Other | Admitting: Endocrinology

## 2018-09-10 VITALS — BP 124/78 | HR 115 | Ht 63.0 in | Wt 208.0 lb

## 2018-09-10 DIAGNOSIS — I6330 Cerebral infarction due to thrombosis of unspecified cerebral artery: Secondary | ICD-10-CM | POA: Diagnosis not present

## 2018-09-10 DIAGNOSIS — E119 Type 2 diabetes mellitus without complications: Secondary | ICD-10-CM

## 2018-09-10 DIAGNOSIS — Z5181 Encounter for therapeutic drug level monitoring: Secondary | ICD-10-CM | POA: Diagnosis not present

## 2018-09-10 DIAGNOSIS — Z7901 Long term (current) use of anticoagulants: Secondary | ICD-10-CM | POA: Diagnosis not present

## 2018-09-10 LAB — POCT GLYCOSYLATED HEMOGLOBIN (HGB A1C): Hemoglobin A1C: 13.4 % — AB (ref 4.0–5.6)

## 2018-09-10 MED ORDER — INSULIN DEGLUDEC 200 UNIT/ML ~~LOC~~ SOPN: 40.0000 [IU] | PEN_INJECTOR | Freq: Every day | SUBCUTANEOUS | 11 refills | Status: DC

## 2018-09-10 NOTE — Patient Instructions (Addendum)
check your blood sugar once a day.  vary the time of day when you check, between before the 3 meals, and at bedtime.  also check if you have symptoms of your blood sugar being too high or too low.  please keep a record of the readings and bring it to your next appointment here (or you can bring the meter itself).  You can write it on any piece of paper.  please call us sooner if your blood sugar goes below 70, or if you have a lot of readings over 200.  I have sent a prescription to your pharmacy, to increase the tresiba insulin to 40 units daily.   Please continue the same diabetes pills.  Please come back for a follow-up appointment in 1 month.

## 2018-09-10 NOTE — Progress Notes (Signed)
Subjective:    Patient ID: Kimberly [name], female    DOB: 11/23/1960, 58 y.o.   MRN: [id number]  HPI Pt returns for f/u of diabetes mellitus: DM type: 2 Dx'ed: 0347 Complications: CVA Therapy: 3 oral meds GDM: never DKA: never Severe hypoglycemia: never.  Pancreatitis: never Pancreatic imaging: never Other: she took insulin 2011-2017; she stopped insulin due to n/v, which she attributed to lantus; she tolerates tresiba well.   Interval history: she brings a record of her cbg's which I have reviewed today.  All are in the 300's.  pt states she feels well in general, except for frequent urination.   Past Medical History:  Diagnosis Date  . Diabetes mellitus   . Hypertension   . Seizures ([place])    last seizure march 2016  . Stroke [facility])     Past Surgical History:  Procedure Laterality Date  . TRACHEOSTOMY CLOSURE      Social History   Socioeconomic History  . Marital status: Married    Spouse name: Not on file  . Number of children: Not on file  . Years of education: Not on file  . Highest education level: Not on file  Occupational History  . Not on file  Social Needs  . Financial resource strain: Not on file  . Food insecurity:    Worry: Not on file    Inability: Not on file  . Transportation needs:    Medical: Not on file    Non-medical: Not on file  Tobacco Use  . Smoking status: Never Smoker  . Smokeless tobacco: Never Used  Substance and Sexual Activity  . Alcohol use: No  . Drug use: No  . Sexual activity: Not on file  Lifestyle  . Physical activity:    Days per week: Not on file    Minutes per session: Not on file  . Stress: Not on file  Relationships  . Social connections:    Talks on phone: Not on file    Gets together: Not on file    Attends religious service: Not on file    Active member of club or organization: Not on file    Attends meetings of clubs or organizations: Not on file    Relationship status: Not on file  . Intimate partner  violence:    Fear of current or ex partner: Not on file    Emotionally abused: Not on file    Physically abused: Not on file    Forced sexual activity: Not on file  Other Topics Concern  . Not on file  Social History Narrative  . Not on file    Current Outpatient Medications on File Prior to Visit  Medication Sig Dispense Refill  . baclofen (LIORESAL) 10 MG tablet TAKE 1 TABLET (10 MG TOTAL) BY MOUTH 3 (THREE) TIMES DAILY. 90 tablet 3  . baclofen (LIORESAL) 20 MG tablet Take 20 mg by mouth 3 (three) times daily.  2  . butalbital-acetaminophen-caffeine (FIORICET, ESGIC) 50-325-40 MG per tablet Take 1 tablet by mouth 2 (two) times daily as needed for headache.    . cholecalciferol (VITAMIN D) 1000 UNITS tablet Take 1,000 Units by mouth daily.    . diclofenac sodium (VOLTAREN) 1 % GEL Apply 1 application topically 4 (four) times daily. 2 Tube 2  . diphenhydrAMINE (BENADRYL) 25 MG tablet Take 1 tablet (25 mg total) by mouth every 6 (six) hours. 20 tablet 0  . fish oil-omega-3 fatty acids 1000 MG capsule Take 1 g by  mouth 3 (three) times daily.     Marland Kitchen glipiZIDE (GLUCOTROL XL) 5 MG 24 hr tablet Take 1 tablet (5 mg total) by mouth daily with breakfast. 30 tablet 11  . glucose blood (ACCU-CHEK GUIDE) test strip 1 each by Other route 2 (two) times daily. And lancets 2/day 100 each 12  . hydrocortisone cream 0.5 % Apply topically as needed.    . lacosamide (VIMPAT) 200 MG TABS Take by mouth 2 (two) times daily.      Marland Kitchen latanoprost (XALATAN) 0.005 % ophthalmic solution INSTILL 1 DROP(S) IN EACH EYE DAILY IN THE EVENING    . latanoprost (XALATAN) 0.005 % ophthalmic solution INSTILL 1 DROP(S) IN EACH EYE DAILY IN THE EVENING  6  . metFORMIN (GLUCOPHAGE-XR) 500 MG 24 hr tablet Take 1 tablet (500 mg total) by mouth daily with breakfast. 90 tablet 3  . omeprazole (PRILOSEC) 20 MG capsule Take 1 capsule (20 mg total) by mouth daily. 30 capsule 0  . simvastatin (ZOCOR) 20 MG tablet Take 40 mg by mouth every  evening.     . simvastatin (ZOCOR) 20 MG tablet Take 20 mg by mouth.    . sitaGLIPtin (JANUVIA) 100 MG tablet Take 1 tablet (100 mg total) by mouth daily. 30 tablet 11  . traMADol (ULTRAM) 50 MG tablet Take 1 tablet (50 mg total) by mouth every 6 (six) hours as needed. 15 tablet 0  . warfarin (COUMADIN) 10 MG tablet Take 10 mg by mouth daily. Monday and Friday 10 mg and all other days 5 mg    . zonisamide (ZONEGRAN) 100 MG capsule Take 300 mg by mouth.    . gabapentin (NEURONTIN) 100 MG capsule Take 400 mg by mouth 3 (three) times daily.     . metoprolol succinate (TOPROL-XL) 25 MG 24 hr tablet Take 25 mg by mouth daily.      . norethindrone (AYGESTIN) 5 MG tablet 10 mg daily.      No current facility-administered medications on file prior to visit.     No Known Allergies  Family History  Problem Relation Age of Onset  . Cancer Father   . Diabetes Neg Hx     BP 124/78 (BP Location: Right Arm, Patient Position: Sitting, Cuff Size: Normal)   Pulse (!) 115   Ht 5\' 3"  (1.6 m)   Wt 208 lb (94.3 kg)   SpO2 97%   BMI 36.85 kg/m    Review of Systems She denies hypoglycemia.      Objective:   Physical Exam VITAL SIGNS:  See vs page GENERAL: no distress Pulses: dorsalis pedis intact bilat.   MSK: no deformity of the feet CV: trace bilat leg edema Skin:  no ulcer on the feet.  normal color and temp on the feet.    Neuro: sensation is intact to touch on the feet.    A1c=13.4%     Assessment & Plan:  Type 2 DM, with CVA: she needs increased rx.  Edema: this limits rx options.  Tachycardia, persistent: prob due to severe hyperglycemia:  Recheck next time.   Patient Instructions  check your blood sugar once a day.  vary the time of day when you check, between before the 3 meals, and at bedtime.  also check if you have symptoms of your blood sugar being too high or too low.  please keep a record of the readings and bring it to your next appointment here (or you can bring the meter  itself).  You can  write it on any piece of paper.  please call us sooner if your blood sugar goes below 70, or if you have a lot of readings over 200.  I have sent a prescription to your pharmacy, to increase the tresiba insulin to 40 units daily.   Please continue the same diabetes pills.  Please come back for a follow-up appointment in 1 month.

## 2018-09-11 DIAGNOSIS — H538 Other visual disturbances: Secondary | ICD-10-CM | POA: Diagnosis not present

## 2018-09-11 DIAGNOSIS — I10 Essential (primary) hypertension: Secondary | ICD-10-CM | POA: Diagnosis not present

## 2018-09-11 DIAGNOSIS — Z0110 Encounter for examination of ears and hearing without abnormal findings: Secondary | ICD-10-CM | POA: Diagnosis not present

## 2018-09-11 DIAGNOSIS — M24575 Contracture, left foot: Secondary | ICD-10-CM | POA: Diagnosis not present

## 2018-09-11 DIAGNOSIS — E119 Type 2 diabetes mellitus without complications: Secondary | ICD-10-CM | POA: Diagnosis not present

## 2018-09-11 DIAGNOSIS — Z0000 Encounter for general adult medical examination without abnormal findings: Secondary | ICD-10-CM | POA: Diagnosis not present

## 2018-10-02 DIAGNOSIS — H401121 Primary open-angle glaucoma, left eye, mild stage: Secondary | ICD-10-CM | POA: Diagnosis not present

## 2018-10-02 DIAGNOSIS — H401112 Primary open-angle glaucoma, right eye, moderate stage: Secondary | ICD-10-CM | POA: Diagnosis not present

## 2018-10-14 DIAGNOSIS — M24575 Contracture, left foot: Secondary | ICD-10-CM | POA: Diagnosis not present

## 2018-10-14 DIAGNOSIS — I6789 Other cerebrovascular disease: Secondary | ICD-10-CM | POA: Diagnosis not present

## 2018-10-14 DIAGNOSIS — E119 Type 2 diabetes mellitus without complications: Secondary | ICD-10-CM | POA: Diagnosis not present

## 2018-10-14 DIAGNOSIS — I10 Essential (primary) hypertension: Secondary | ICD-10-CM | POA: Diagnosis not present

## 2018-10-14 DIAGNOSIS — E785 Hyperlipidemia, unspecified: Secondary | ICD-10-CM | POA: Diagnosis not present

## 2018-10-15 ENCOUNTER — Ambulatory Visit (INDEPENDENT_AMBULATORY_CARE_PROVIDER_SITE_OTHER): Payer: Medicare Other | Admitting: Endocrinology

## 2018-10-15 ENCOUNTER — Encounter: Payer: Self-pay | Admitting: Endocrinology

## 2018-10-15 VITALS — BP 120/78 | HR 111 | Ht 63.0 in | Wt 198.2 lb

## 2018-10-15 DIAGNOSIS — R791 Abnormal coagulation profile: Secondary | ICD-10-CM | POA: Diagnosis not present

## 2018-10-15 DIAGNOSIS — Z794 Long term (current) use of insulin: Secondary | ICD-10-CM

## 2018-10-15 DIAGNOSIS — E1149 Type 2 diabetes mellitus with other diabetic neurological complication: Secondary | ICD-10-CM

## 2018-10-15 DIAGNOSIS — Z7901 Long term (current) use of anticoagulants: Secondary | ICD-10-CM | POA: Diagnosis not present

## 2018-10-15 DIAGNOSIS — Z8673 Personal history of transient ischemic attack (TIA), and cerebral infarction without residual deficits: Secondary | ICD-10-CM | POA: Diagnosis not present

## 2018-10-15 DIAGNOSIS — Z5181 Encounter for therapeutic drug level monitoring: Secondary | ICD-10-CM | POA: Diagnosis not present

## 2018-10-15 MED ORDER — INSULIN DEGLUDEC 200 UNIT/ML ~~LOC~~ SOPN: 50.0000 [IU] | PEN_INJECTOR | Freq: Every day | SUBCUTANEOUS | 11 refills | Status: DC

## 2018-10-15 NOTE — Patient Instructions (Addendum)
check your blood sugar once a day.  vary the time of day when you check, between before the 3 meals, and at bedtime.  also check if you have symptoms of your blood sugar being too high or too low.  please keep a record of the readings and bring it to your next appointment here (or you can bring the meter itself).  You can write it on any piece of paper.  please call us sooner if your blood sugar goes below 70, or if you have a lot of readings over 200.  please increase the tresiba insulin to 50 units daily.   Please continue the same diabetes pills.  Please come back for a follow-up appointment in 1 month.

## 2018-10-15 NOTE — Progress Notes (Signed)
Subjective:    Patient ID: Kimberly [name], female    DOB: [birth year], 58 y.o.   MRN: [id number]  HPI  Pt returns for f/u of DM: DM type: Insulin-requiring type 2 Dx'ed: 8841 Complications: CVA Therapy: 3 oral meds, and insulin since 2019 GDM: never DKA: never Severe hypoglycemia: never.  Pancreatitis: never Pancreatic imaging: never Other: she also took insulin 2011-2017; she stopped insulin due to n/v, which she attributed to lantus; she tolerates tresiba well.   Interval history: Meter is downloaded today, and the printout is scanned into the record.  cbg varies from 168-200's.  pt states she feels better in general.  Past Medical History:  Diagnosis Date  . Diabetes mellitus   . Hypertension   . Seizures ([place])    last seizure march 2016  . Stroke [facility])     Past Surgical History:  Procedure Laterality Date  . TRACHEOSTOMY CLOSURE      Social History   Socioeconomic History  . Marital status: Married    Spouse name: Not on file  . Number of children: Not on file  . Years of education: Not on file  . Highest education level: Not on file  Occupational History  . Not on file  Social Needs  . Financial resource strain: Not on file  . Food insecurity:    Worry: Not on file    Inability: Not on file  . Transportation needs:    Medical: Not on file    Non-medical: Not on file  Tobacco Use  . Smoking status: Never Smoker  . Smokeless tobacco: Never Used  Substance and Sexual Activity  . Alcohol use: No  . Drug use: No  . Sexual activity: Not on file  Lifestyle  . Physical activity:    Days per week: Not on file    Minutes per session: Not on file  . Stress: Not on file  Relationships  . Social connections:    Talks on phone: Not on file    Gets together: Not on file    Attends religious service: Not on file    Active member of club or organization: Not on file    Attends meetings of clubs or organizations: Not on file    Relationship status: Not on file    . Intimate partner violence:    Fear of current or ex partner: Not on file    Emotionally abused: Not on file    Physically abused: Not on file    Forced sexual activity: Not on file  Other Topics Concern  . Not on file  Social History Narrative  . Not on file    Current Outpatient Medications on File Prior to Visit  Medication Sig Dispense Refill  . baclofen (LIORESAL) 20 MG tablet Take 20 mg by mouth 3 (three) times daily.  2  . butalbital-acetaminophen-caffeine (FIORICET, ESGIC) 50-325-40 MG per tablet Take 1 tablet by mouth 2 (two) times daily as needed for headache.    . cholecalciferol (VITAMIN D) 1000 UNITS tablet Take 1,000 Units by mouth daily.    . fish oil-omega-3 fatty acids 1000 MG capsule Take 1 g by mouth 3 (three) times daily.     Marland Kitchen glipiZIDE (GLUCOTROL XL) 5 MG 24 hr tablet Take 1 tablet (5 mg total) by mouth daily with breakfast. 30 tablet 11  . glucose blood (ACCU-CHEK GUIDE) test strip 1 each by Other route 2 (two) times daily. And lancets 2/day 100 each 12  . hydrocortisone cream 0.5 %  Apply topically as needed.    . lacosamide (VIMPAT) 200 MG TABS Take by mouth 2 (two) times daily.      Marland Kitchen latanoprost (XALATAN) 0.005 % ophthalmic solution INSTILL 1 DROP(S) IN EACH EYE DAILY IN THE EVENING    . latanoprost (XALATAN) 0.005 % ophthalmic solution INSTILL 1 DROP(S) IN EACH EYE DAILY IN THE EVENING  6  . metFORMIN (GLUCOPHAGE-XR) 500 MG 24 hr tablet Take 1 tablet (500 mg total) by mouth daily with breakfast. 90 tablet 3  . simvastatin (ZOCOR) 20 MG tablet Take 40 mg by mouth every evening.     . simvastatin (ZOCOR) 20 MG tablet Take 20 mg by mouth.    . sitaGLIPtin (JANUVIA) 100 MG tablet Take 1 tablet (100 mg total) by mouth daily. 30 tablet 11  . traMADol (ULTRAM) 50 MG tablet Take 1 tablet (50 mg total) by mouth every 6 (six) hours as needed. 15 tablet 0  . warfarin (COUMADIN) 10 MG tablet Take 10 mg by mouth daily. Monday and Friday 10 mg and all other days 5 mg    .  zonisamide (ZONEGRAN) 100 MG capsule Take 300 mg by mouth.     No current facility-administered medications on file prior to visit.     No Known Allergies  Family History  Problem Relation Age of Onset  . Cancer Father   . Diabetes Neg Hx     BP 120/78 (BP Location: Right Arm, Patient Position: Sitting, Cuff Size: Large)   Pulse (!) 111   Ht 5\' 3"  (1.6 m)   Wt 198 lb 3.2 oz (89.9 kg)   SpO2 97%   BMI 35.11 kg/m    Review of Systems She denies hypoglycemia.     Objective:   Physical Exam VITAL SIGNS:  See vs page GENERAL: no distress Pulses: dorsalis pedis intact bilat.   MSK: no deformity of the feet CV: no leg edema Skin:  no ulcer on the feet.  normal color and temp on the feet. Neuro: sensation is intact to touch on the feet  Lab Results  Component Value Date   HGBA1C 13.4 (A) 09/10/2018       Assessment & Plan:  Insulin-requiring type 2 DM, with CVA: she needs increased rx.   Patient Instructions  check your blood sugar once a day.  vary the time of day when you check, between before the 3 meals, and at bedtime.  also check if you have symptoms of your blood sugar being too high or too low.  please keep a record of the readings and bring it to your next appointment here (or you can bring the meter itself).  You can write it on any piece of paper.  please call us sooner if your blood sugar goes below 70, or if you have a lot of readings over 200.  please increase the tresiba insulin to 50 units daily.   Please continue the same diabetes pills.  Please come back for a follow-up appointment in 1 month.

## 2018-10-29 ENCOUNTER — Ambulatory Visit (INDEPENDENT_AMBULATORY_CARE_PROVIDER_SITE_OTHER): Payer: Medicare Other | Admitting: Podiatry

## 2018-10-29 DIAGNOSIS — Z9229 Personal history of other drug therapy: Secondary | ICD-10-CM | POA: Diagnosis not present

## 2018-10-29 DIAGNOSIS — E1142 Type 2 diabetes mellitus with diabetic polyneuropathy: Secondary | ICD-10-CM

## 2018-10-29 DIAGNOSIS — L84 Corns and callosities: Secondary | ICD-10-CM | POA: Diagnosis not present

## 2018-10-29 DIAGNOSIS — B351 Tinea unguium: Secondary | ICD-10-CM

## 2018-10-29 DIAGNOSIS — M79675 Pain in left toe(s): Secondary | ICD-10-CM | POA: Diagnosis not present

## 2018-10-29 DIAGNOSIS — Z5181 Encounter for therapeutic drug level monitoring: Secondary | ICD-10-CM | POA: Diagnosis not present

## 2018-10-29 DIAGNOSIS — Z7901 Long term (current) use of anticoagulants: Secondary | ICD-10-CM | POA: Diagnosis not present

## 2018-10-29 DIAGNOSIS — M79674 Pain in right toe(s): Secondary | ICD-10-CM | POA: Diagnosis not present

## 2018-10-29 DIAGNOSIS — Z8673 Personal history of transient ischemic attack (TIA), and cerebral infarction without residual deficits: Secondary | ICD-10-CM | POA: Diagnosis not present

## 2018-10-29 NOTE — Patient Instructions (Signed)

## 2018-11-05 DIAGNOSIS — H401121 Primary open-angle glaucoma, left eye, mild stage: Secondary | ICD-10-CM | POA: Diagnosis not present

## 2018-11-05 DIAGNOSIS — H401112 Primary open-angle glaucoma, right eye, moderate stage: Secondary | ICD-10-CM | POA: Diagnosis not present

## 2018-11-06 ENCOUNTER — Encounter: Payer: Self-pay | Admitting: Podiatry

## 2018-11-06 NOTE — Progress Notes (Signed)
Subjective: Kimberly [name] presents today for preventative diabetic foot care.  She is accompanied by her husband and her caregiver.  She presents with history of diabetes, diabetic neuropathy and cc of painful, discolored, thick toenails  which interfere with activities of daily living. Pain is aggravated when wearing enclosed shoe gear. Pain is relieved with periodic professional debridement.  Patient also has chronic plantar callosity of the left foot.  Patient is also on long-term blood thinner warfarin.  She states she is waiting on approval for new AFO.  [name], MD is her PCP.  Her last hemoglobin A1c was 13.4.   Current Outpatient Medications:  .  baclofen (LIORESAL) 20 MG tablet, Take 20 mg by mouth 3 (three) times daily., Disp: , Rfl: 2 .  butalbital-acetaminophen-caffeine (FIORICET, ESGIC) 50-325-40 MG per tablet, Take 1 tablet by mouth 2 (two) times daily as needed for headache., Disp: , Rfl:  .  cholecalciferol (VITAMIN D) 1000 UNITS tablet, Take 1,000 Units by mouth daily., Disp: , Rfl:  .  fish oil-omega-3 fatty acids 1000 MG capsule, Take 1 g by mouth 3 (three) times daily. , Disp: , Rfl:  .  glipiZIDE (GLUCOTROL XL) 5 MG 24 hr tablet, Take 1 tablet (5 mg total) by mouth daily with breakfast., Disp: 30 tablet, Rfl: 11 .  glucose blood (ACCU-CHEK GUIDE) test strip, 1 each by Other route 2 (two) times daily. And lancets 2/day, Disp: 100 each, Rfl: 12 .  hydrocortisone cream 0.5 %, Apply topically as needed., Disp: , Rfl:  .  Insulin Degludec (TRESIBA FLEXTOUCH) 200 UNIT/ML SOPN, Inject 50 Units into the skin daily. And pen needles 1/day, Disp: 3 pen, Rfl: 11 .  lacosamide (VIMPAT) 200 MG TABS, Take by mouth 2 (two) times daily.  , Disp: , Rfl:  .  latanoprost (XALATAN) 0.005 % ophthalmic solution, INSTILL 1 DROP(S) IN [facility] EYE DAILY IN THE EVENING, Disp: , Rfl:  .  latanoprost (XALATAN) 0.005 % ophthalmic solution, INSTILL 1 DROP(S) IN [facility] EYE DAILY IN THE EVENING,  Disp: , Rfl: 6 .  metFORMIN (GLUCOPHAGE-XR) 500 MG 24 hr tablet, Take 1 tablet (500 mg total) by mouth daily with breakfast., Disp: 90 tablet, Rfl: 3 .  simvastatin (ZOCOR) 20 MG tablet, Take 40 mg by mouth every evening. , Disp: , Rfl:  .  simvastatin (ZOCOR) 20 MG tablet, Take 20 mg by mouth., Disp: , Rfl:  .  sitaGLIPtin (JANUVIA) 100 MG tablet, Take 1 tablet (100 mg total) by mouth daily., Disp: 30 tablet, Rfl: 11 .  traMADol (ULTRAM) 50 MG tablet, Take 1 tablet (50 mg total) by mouth every 6 (six) hours as needed., Disp: 15 tablet, Rfl: 0 .  warfarin (COUMADIN) 10 MG tablet, Take 10 mg by mouth daily. Monday and Friday 10 mg and all other days 5 mg, Disp: , Rfl:  .  zonisamide (ZONEGRAN) 100 MG capsule, Take 300 mg by mouth., Disp: , Rfl:   No Known Allergies  Vascular Examination: Capillary refill time is immediate x10 digits.  Dorsalis pedis and Posterior tibial pulses present b/l.  No digital hair x 10 digits.  Skin temperature gradient within normal limits bilaterally.  Dermatological Examination: Skin with normal turgor, texture and tone b/l.  Toenails 1-5 b/l discolored, thick, dystrophic with subungual debris and pain with palpation to nailbeds due to thickness of nails.  Hyperkeratotic lesion submetatarsal head 5 of the left foot.  There is no erythema, no edema, no drainage, no flocculence noted.  Musculoskeletal: Muscle strength 5/5  to all LE muscle groups of the right lower extremity.  Flaccid left lower extremity secondary to CVA.  Neurological: Sensation diminished with 10 gram monofilament. Vibratory sensation diminished  Assessment: 1. Painful onychomycosis toenails 1-5 b/l 2. Callus submetatarsal head 5 left foot 3. NIDDM with Diabetic neuropathy 4. Patient on long-term history of blood thinner  Plan: 1. Continue diabetic foot care principles.  Literature dispensed on today 2. Toenails 1-5 b/l were debrided in length and girth without iatrogenic  bleeding. 3. Callus pared with sterile scalpel blade submetatarsal head 5 left foot without incident.   4. Patient to continue soft, supportive shoe gear 5. Patient to report any pedal injuries to medical professional  6. Follow up 3 months.  7. Patient/POA to call should there be a concern in the interim.

## 2018-11-12 ENCOUNTER — Other Ambulatory Visit: Payer: Self-pay

## 2018-11-12 ENCOUNTER — Ambulatory Visit (INDEPENDENT_AMBULATORY_CARE_PROVIDER_SITE_OTHER): Payer: Medicare Other | Admitting: Endocrinology

## 2018-11-12 ENCOUNTER — Encounter: Payer: Self-pay | Admitting: Endocrinology

## 2018-11-12 VITALS — BP 112/64 | HR 107 | Ht 63.0 in | Wt 199.0 lb

## 2018-11-12 DIAGNOSIS — Z794 Long term (current) use of insulin: Secondary | ICD-10-CM

## 2018-11-12 DIAGNOSIS — E1149 Type 2 diabetes mellitus with other diabetic neurological complication: Secondary | ICD-10-CM

## 2018-11-12 LAB — POCT GLYCOSYLATED HEMOGLOBIN (HGB A1C): Hemoglobin A1C: 9.8 % — AB (ref 4.0–5.6)

## 2018-11-12 MED ORDER — INSULIN DEGLUDEC 200 UNIT/ML ~~LOC~~ SOPN: 60.0000 [IU] | PEN_INJECTOR | Freq: Every day | SUBCUTANEOUS | 11 refills | Status: DC

## 2018-11-12 MED ORDER — GLIPIZIDE ER 2.5 MG PO TB24: 2.5000 mg | ORAL_TABLET | Freq: Every day | ORAL | 2 refills | Status: DC

## 2018-11-12 MED ORDER — INSULIN DEGLUDEC 200 UNIT/ML ~~LOC~~ SOPN: 55.0000 [IU] | PEN_INJECTOR | Freq: Every day | SUBCUTANEOUS | 11 refills | Status: DC

## 2018-11-12 NOTE — Patient Instructions (Addendum)
check your blood sugar once a day.  vary the time of day when you check, between before the 3 meals, and at bedtime.  also check if you have symptoms of your blood sugar being too high or too low.  please keep a record of the readings and bring it to your next appointment here (or you can bring the meter itself).  You can write it on any piece of paper.  please call us sooner if your blood sugar goes below 70, or if you have a lot of readings over 200.  Try keeping your right heel off the recliner or chair you are on.  You could also ask your foot doctor.   please increase the tresiba insulin to 55 units daily, and: Reduce the glipizide.   Please come back for a follow-up appointment in 2 months.

## 2018-11-12 NOTE — Progress Notes (Signed)
Subjective:    Patient ID: Kimberly [name], female    DOB: Jul 06, 1961, 58 y.o.   MRN: [id number]  HPI Pt returns for f/u of DM: DM type: Insulin-requiring type 2 Dx'ed: 6812 Complications: CVA Therapy: 3 oral meds, and insulin since 2019 GDM: never DKA: never Severe hypoglycemia: never.  Pancreatitis: never Pancreatic imaging: never Other: she also took insulin 2011-2017; she stopped insulin due to n/v, which she attributed to lantus; she tolerates tresiba well.   Interval history: Meter is downloaded today, and the printout is scanned into the record.  cbg varies from 142-244.  There is no trend throughout the day, but she checks 8 AM-3 PM only.  pt states she feels better in general. Past Medical History:  Diagnosis Date  . Diabetes mellitus   . Hypertension   . Seizures ([place])    last seizure march 2016  . Stroke [facility])     Past Surgical History:  Procedure Laterality Date  . TRACHEOSTOMY CLOSURE      Social History   Socioeconomic History  . Marital status: Married    Spouse name: Not on file  . Number of children: Not on file  . Years of education: Not on file  . Highest education level: Not on file  Occupational History  . Not on file  Social Needs  . Financial resource strain: Not on file  . Food insecurity:    Worry: Not on file    Inability: Not on file  . Transportation needs:    Medical: Not on file    Non-medical: Not on file  Tobacco Use  . Smoking status: Never Smoker  . Smokeless tobacco: Never Used  Substance and Sexual Activity  . Alcohol use: No  . Drug use: No  . Sexual activity: Not on file  Lifestyle  . Physical activity:    Days per week: Not on file    Minutes per session: Not on file  . Stress: Not on file  Relationships  . Social connections:    Talks on phone: Not on file    Gets together: Not on file    Attends religious service: Not on file    Active member of club or organization: Not on file    Attends meetings of clubs or  organizations: Not on file    Relationship status: Not on file  . Intimate partner violence:    Fear of current or ex partner: Not on file    Emotionally abused: Not on file    Physically abused: Not on file    Forced sexual activity: Not on file  Other Topics Concern  . Not on file  Social History Narrative  . Not on file    Current Outpatient Medications on File Prior to Visit  Medication Sig Dispense Refill  . baclofen (LIORESAL) 20 MG tablet Take 20 mg by mouth 3 (three) times daily.  2  . butalbital-acetaminophen-caffeine (FIORICET, ESGIC) 50-325-40 MG per tablet Take 1 tablet by mouth 2 (two) times daily as needed for headache.    . cholecalciferol (VITAMIN D) 1000 UNITS tablet Take 1,000 Units by mouth daily.    . fish oil-omega-3 fatty acids 1000 MG capsule Take 1 g by mouth 3 (three) times daily.     Marland Kitchen glucose blood (ACCU-CHEK GUIDE) test strip 1 each by Other route 2 (two) times daily. And lancets 2/day 100 each 12  . hydrocortisone cream 0.5 % Apply topically as needed.    . lacosamide (VIMPAT) 200 MG  TABS Take by mouth 2 (two) times daily.      Marland Kitchen latanoprost (XALATAN) 0.005 % ophthalmic solution INSTILL 1 DROP(S) IN EACH EYE DAILY IN THE EVENING    . latanoprost (XALATAN) 0.005 % ophthalmic solution INSTILL 1 DROP(S) IN EACH EYE DAILY IN THE EVENING  6  . metFORMIN (GLUCOPHAGE-XR) 500 MG 24 hr tablet Take 1 tablet (500 mg total) by mouth daily with breakfast. 90 tablet 3  . simvastatin (ZOCOR) 20 MG tablet Take 40 mg by mouth every evening.     . simvastatin (ZOCOR) 20 MG tablet Take 20 mg by mouth.    . sitaGLIPtin (JANUVIA) 100 MG tablet Take 1 tablet (100 mg total) by mouth daily. 30 tablet 11  . traMADol (ULTRAM) 50 MG tablet Take 1 tablet (50 mg total) by mouth every 6 (six) hours as needed. 15 tablet 0  . warfarin (COUMADIN) 10 MG tablet Take 10 mg by mouth daily. Monday and Friday 10 mg and all other days 5 mg    . zonisamide (ZONEGRAN) 100 MG capsule Take 300 mg by  mouth.     No current facility-administered medications on file prior to visit.     No Known Allergies  Family History  Problem Relation Age of Onset  . Cancer Father   . Diabetes Neg Hx     BP 112/64 (BP Location: Right Arm, Patient Position: Sitting, Cuff Size: Large)   Pulse (!) 107   Ht 5\' 3"  (1.6 m)   Wt 199 lb (90.3 kg) Comment: verbalized  SpO2 98%   BMI 35.25 kg/m    Review of Systems She denies hypoglycemia.  She has right heel pain.     Objective:   Physical Exam VITAL SIGNS:  See vs page GENERAL: no distress Pulses: dorsalis pedis intact bilat.   MSK: no deformity of the feet CV: 1+ bilat leg edema Skin:  no ulcer on the feet.  normal color and temp on the feet. Neuro: sensation is intact to touch on the feet.    Lab Results  Component Value Date   HGBA1C 9.8 (A) 11/12/2018       Assessment & Plan:  Insulin-requiring type 2 DM, with CVA: she needs increased rx.  Noncompliance with cbg recording: in this setting, we need to make small med adjustments.  Edema: This limits rx options   Patient Instructions  check your blood sugar once a day.  vary the time of day when you check, between before the 3 meals, and at bedtime.  also check if you have symptoms of your blood sugar being too high or too low.  please keep a record of the readings and bring it to your next appointment here (or you can bring the meter itself).  You can write it on any piece of paper.  please call us sooner if your blood sugar goes below 70, or if you have a lot of readings over 200.  Try keeping your right heel off the recliner or chair you are on.  You could also ask your foot doctor.   please increase the tresiba insulin to 55 units daily, and: Reduce the glipizide.   Please come back for a follow-up appointment in 2 months.

## 2018-11-14 ENCOUNTER — Other Ambulatory Visit: Payer: Self-pay

## 2018-11-14 ENCOUNTER — Telehealth: Payer: Self-pay | Admitting: Endocrinology

## 2018-11-14 DIAGNOSIS — E1149 Type 2 diabetes mellitus with other diabetic neurological complication: Secondary | ICD-10-CM

## 2018-11-14 DIAGNOSIS — G8190 Hemiplegia, unspecified affecting unspecified side: Secondary | ICD-10-CM | POA: Diagnosis not present

## 2018-11-14 DIAGNOSIS — Z794 Long term (current) use of insulin: Secondary | ICD-10-CM

## 2018-11-14 DIAGNOSIS — I6789 Other cerebrovascular disease: Secondary | ICD-10-CM | POA: Diagnosis not present

## 2018-11-14 DIAGNOSIS — I10 Essential (primary) hypertension: Secondary | ICD-10-CM | POA: Diagnosis not present

## 2018-11-14 MED ORDER — ACCU-CHEK SOFTCLIX LANCETS MISC
3 refills | Status: DC
Start: 2018-11-14 — End: 2022-01-03

## 2018-11-14 NOTE — Telephone Encounter (Signed)
MEDICATION:  ACCU CHECK SOFT CLICK LANCETS   PHARMACY:  [id number] - [place], [place] - [address]  IS THIS A 90 DAY SUPPLY :   IS PATIENT OUT OF MEDICATION:  yes  IF NOT; HOW MUCH IS LEFT:   LAST APPOINTMENT DATE: @3 /18/2020  NEXT APPOINTMENT DATE:@5 /20/2020  DO WE HAVE YOUR PERMISSION TO LEAVE A DETAILED MESSAGE:  OTHER COMMENTS:    **Let patient know to contact pharmacy at the end of the day to make sure medication is ready. **  ** Please notify patient to allow 48-72 hours to process**  **Encourage patient to contact the pharmacy for refills or they can request refills through [facility]**

## 2018-11-14 NOTE — Telephone Encounter (Signed)
Accu-Chek Softclix Lancets lancets 100 each 3 11/14/2018    Sig: Use to monitor glucose levels once daily; E11.9   Sent to pharmacy as: Accu-Chek Softclix Lancets lancets   E-Prescribing Status: Receipt confirmed by pharmacy (11/14/2018 2:08 PM EDT)

## 2018-11-19 DIAGNOSIS — Z7901 Long term (current) use of anticoagulants: Secondary | ICD-10-CM | POA: Diagnosis not present

## 2018-11-19 DIAGNOSIS — Z5181 Encounter for therapeutic drug level monitoring: Secondary | ICD-10-CM | POA: Diagnosis not present

## 2018-11-19 DIAGNOSIS — I6330 Cerebral infarction due to thrombosis of unspecified cerebral artery: Secondary | ICD-10-CM | POA: Diagnosis not present

## 2018-11-20 DIAGNOSIS — M24575 Contracture, left foot: Secondary | ICD-10-CM | POA: Diagnosis not present

## 2018-11-20 DIAGNOSIS — I10 Essential (primary) hypertension: Secondary | ICD-10-CM | POA: Diagnosis not present

## 2018-11-20 DIAGNOSIS — E119 Type 2 diabetes mellitus without complications: Secondary | ICD-10-CM | POA: Diagnosis not present

## 2018-11-20 DIAGNOSIS — E785 Hyperlipidemia, unspecified: Secondary | ICD-10-CM | POA: Diagnosis not present

## 2018-11-20 DIAGNOSIS — I6789 Other cerebrovascular disease: Secondary | ICD-10-CM | POA: Diagnosis not present

## 2018-11-24 ENCOUNTER — Ambulatory Visit: Payer: Medicare Other | Admitting: Orthotics

## 2019-01-07 ENCOUNTER — Ambulatory Visit: Payer: Medicare Other | Admitting: Orthotics

## 2019-01-07 DIAGNOSIS — Z5181 Encounter for therapeutic drug level monitoring: Secondary | ICD-10-CM | POA: Diagnosis not present

## 2019-01-07 DIAGNOSIS — I6330 Cerebral infarction due to thrombosis of unspecified cerebral artery: Secondary | ICD-10-CM | POA: Diagnosis not present

## 2019-01-07 DIAGNOSIS — R791 Abnormal coagulation profile: Secondary | ICD-10-CM | POA: Diagnosis not present

## 2019-01-07 DIAGNOSIS — Z7901 Long term (current) use of anticoagulants: Secondary | ICD-10-CM | POA: Diagnosis not present

## 2019-01-13 ENCOUNTER — Encounter: Payer: Self-pay | Admitting: Endocrinology

## 2019-01-14 ENCOUNTER — Other Ambulatory Visit: Payer: Self-pay

## 2019-01-14 ENCOUNTER — Telehealth: Payer: Self-pay | Admitting: Endocrinology

## 2019-01-14 ENCOUNTER — Ambulatory Visit (INDEPENDENT_AMBULATORY_CARE_PROVIDER_SITE_OTHER): Payer: Medicare Other | Admitting: Endocrinology

## 2019-01-14 DIAGNOSIS — E1149 Type 2 diabetes mellitus with other diabetic neurological complication: Secondary | ICD-10-CM | POA: Diagnosis not present

## 2019-01-14 DIAGNOSIS — Z794 Long term (current) use of insulin: Secondary | ICD-10-CM

## 2019-01-14 MED ORDER — GLUCOSE BLOOD VI STRP: 1.0000 | ORAL_STRIP | Freq: Two times a day (BID) | 12 refills | Status: DC

## 2019-01-14 MED ORDER — GLIPIZIDE ER 10 MG PO TB24: 10.0000 mg | ORAL_TABLET | Freq: Every day | ORAL | 3 refills | Status: DC

## 2019-01-14 MED ORDER — INSULIN DEGLUDEC 200 UNIT/ML ~~LOC~~ SOPN: 45.0000 [IU] | PEN_INJECTOR | Freq: Every day | SUBCUTANEOUS | 11 refills | Status: DC

## 2019-01-14 NOTE — Progress Notes (Signed)
Subjective:    Patient ID: Kimberly [name], female    DOB: [birth year], 58 y.o.   MRN: [id number]  HPI  telehealth visit today via phone x 13 minutes Alternatives to telehealth are presented to this patient, and the patient agrees to the telehealth visit. Pt is advised of the cost of the visit, and agrees to this, also.   Patient is at home, and I am at the office.   Persons attending the telehealth visit: the patient, dtr, and I Pt returns for f/u of DM: DM type: Insulin-requiring type 2 Dx'ed: 1601 Complications: CVA Therapy: 3 oral meds, and insulin since 2019 GDM: never DKA: never Severe hypoglycemia: never.  Pancreatitis: never Pancreatic imaging: never Other: she also took insulin 2011-2017; she stopped insulin due to n/v, which she attributed to lantus; she tolerates tresiba well.   Interval history: pt says she never misses the medication.  cbg varies from 118-150.   pt states she feels well in general.  She wants to minimize all meds Past Medical History:  Diagnosis Date  . Diabetes mellitus   . Hypertension   . Seizures ([place])    last seizure march 2016  . Stroke [facility])     Past Surgical History:  Procedure Laterality Date  . TRACHEOSTOMY CLOSURE      Social History   Socioeconomic History  . Marital status: Married    Spouse name: Not on file  . Number of children: Not on file  . Years of education: Not on file  . Highest education level: Not on file  Occupational History  . Not on file  Social Needs  . Financial resource strain: Not on file  . Food insecurity:    Worry: Not on file    Inability: Not on file  . Transportation needs:    Medical: Not on file    Non-medical: Not on file  Tobacco Use  . Smoking status: Never Smoker  . Smokeless tobacco: Never Used  Substance and Sexual Activity  . Alcohol use: No  . Drug use: No  . Sexual activity: Not on file  Lifestyle  . Physical activity:    Days per week: Not on file    Minutes per session: Not  on file  . Stress: Not on file  Relationships  . Social connections:    Talks on phone: Not on file    Gets together: Not on file    Attends religious service: Not on file    Active member of club or organization: Not on file    Attends meetings of clubs or organizations: Not on file    Relationship status: Not on file  . Intimate partner violence:    Fear of current or ex partner: Not on file    Emotionally abused: Not on file    Physically abused: Not on file    Forced sexual activity: Not on file  Other Topics Concern  . Not on file  Social History Narrative  . Not on file    Current Outpatient Medications on File Prior to Visit  Medication Sig Dispense Refill  . Accu-Chek Softclix Lancets lancets Use to monitor glucose levels once daily; E11.9 100 each 3  . baclofen (LIORESAL) 20 MG tablet Take 20 mg by mouth 3 (three) times daily.  2  . butalbital-acetaminophen-caffeine (FIORICET, ESGIC) 50-325-40 MG per tablet Take 1 tablet by mouth 2 (two) times daily as needed for headache.    . cholecalciferol (VITAMIN D) 1000 UNITS tablet Take 1,000  Units by mouth daily.    . fish oil-omega-3 fatty acids 1000 MG capsule Take 1 g by mouth 3 (three) times daily.     . hydrocortisone cream 0.5 % Apply topically as needed.    . lacosamide (VIMPAT) 200 MG TABS Take by mouth 2 (two) times daily.      Marland Kitchen latanoprost (XALATAN) 0.005 % ophthalmic solution INSTILL 1 DROP(S) IN EACH EYE DAILY IN THE EVENING    . latanoprost (XALATAN) 0.005 % ophthalmic solution INSTILL 1 DROP(S) IN EACH EYE DAILY IN THE EVENING  6  . metFORMIN (GLUCOPHAGE-XR) 500 MG 24 hr tablet Take 1 tablet (500 mg total) by mouth daily with breakfast. 90 tablet 3  . simvastatin (ZOCOR) 20 MG tablet Take 40 mg by mouth every evening.     . simvastatin (ZOCOR) 20 MG tablet Take 20 mg by mouth.    . sitaGLIPtin (JANUVIA) 100 MG tablet Take 1 tablet (100 mg total) by mouth daily. 30 tablet 11  . traMADol (ULTRAM) 50 MG tablet Take 1  tablet (50 mg total) by mouth every 6 (six) hours as needed. 15 tablet 0  . warfarin (COUMADIN) 10 MG tablet Take 10 mg by mouth daily. Monday and Friday 10 mg and all other days 5 mg    . zonisamide (ZONEGRAN) 100 MG capsule Take 300 mg by mouth.     No current facility-administered medications on file prior to visit.     No Known Allergies  Family History  Problem Relation Age of Onset  . Cancer Father   . Diabetes Neg Hx      Review of Systems She denies hypoglycemia    Objective:   Physical Exam    Lab Results  Component Value Date   CREATININE 0.76 12/24/2017   BUN 17 12/24/2017   NA 135 12/24/2017   K 3.9 12/24/2017   CL 103 12/24/2017   CO2 20 (L) 12/24/2017       Assessment & Plan:  Insulin-requiring type 2 DM, with CVA: we discussed the fact that it would take significant weight loss in order to reduce meds.  She wants to reduce insulin and increase non-insulin rx.   Patient Instructions  check your blood sugar once a day.  vary the time of day when you check, between before the 3 meals, and at bedtime.  also check if you have symptoms of your blood sugar being too high or too low.  please keep a record of the readings and bring it to your next appointment here (or you can bring the meter itself).  You can write it on any piece of paper.  please call us sooner if your blood sugar goes below 70, or if you have a lot of readings over 200.  reduce the tresiba insulin to 45 units daily, and:  increase the glipizide to 10 mg daily.  Please come back for a follow-up appointment in 1-2 weeks.

## 2019-01-14 NOTE — Telephone Encounter (Signed)
MEDICATION: glucose blood (ACCU-CHEK GUIDE) test strip  PHARMACY:   [id number] - [place], [place] (Phone) [phone] (Fax)    IS THIS A 90 DAY SUPPLY : Yes  IS PATIENT OUT OF MEDICATION: Yes  IF NOT; HOW MUCH IS LEFT: None LAST APPOINTMENT DATE: @5 /20/2020  NEXT APPOINTMENT DATE:@Visit  date not found  DO WE HAVE YOUR PERMISSION TO LEAVE A DETAILED MESSAGE: Yes  OTHER COMMENTS:    **Let patient know to contact pharmacy at the end of the day to make sure medication is ready. **  ** Please notify patient to allow 48-72 hours to process**  **Encourage patient to contact the pharmacy for refills or they can request refills through [facility]**

## 2019-01-14 NOTE — Patient Instructions (Addendum)
check your blood sugar once a day.  vary the time of day when you check, between before the 3 meals, and at bedtime.  also check if you have symptoms of your blood sugar being too high or too low.  please keep a record of the readings and bring it to your next appointment here (or you can bring the meter itself).  You can write it on any piece of paper.  please call us sooner if your blood sugar goes below 70, or if you have a lot of readings over 200.  reduce the tresiba insulin to 45 units daily, and:  increase the glipizide to 10 mg daily.  Please come back for a follow-up appointment in 1-2 weeks.

## 2019-01-14 NOTE — Telephone Encounter (Signed)
glucose blood (ACCU-CHEK GUIDE) test strip 100 each 12 01/14/2019    Sig - Route: 1 each by Other route 2 (two) times daily. And lancets 2/day - Other   Sent to pharmacy as: glucose blood (ACCU-CHEK GUIDE) test strip   E-Prescribing Status: Receipt confirmed by pharmacy (01/14/2019 2:52 PM EDT)

## 2019-01-27 ENCOUNTER — Other Ambulatory Visit: Payer: Self-pay

## 2019-01-27 ENCOUNTER — Encounter: Payer: Self-pay | Admitting: Podiatry

## 2019-01-27 ENCOUNTER — Ambulatory Visit: Payer: Medicare Other | Admitting: Orthotics

## 2019-01-27 ENCOUNTER — Ambulatory Visit (INDEPENDENT_AMBULATORY_CARE_PROVIDER_SITE_OTHER): Payer: Medicare Other | Admitting: Podiatry

## 2019-01-27 DIAGNOSIS — B351 Tinea unguium: Secondary | ICD-10-CM | POA: Diagnosis not present

## 2019-01-27 DIAGNOSIS — E1165 Type 2 diabetes mellitus with hyperglycemia: Secondary | ICD-10-CM | POA: Diagnosis not present

## 2019-01-27 DIAGNOSIS — L84 Corns and callosities: Secondary | ICD-10-CM

## 2019-01-27 DIAGNOSIS — E1142 Type 2 diabetes mellitus with diabetic polyneuropathy: Secondary | ICD-10-CM

## 2019-01-27 DIAGNOSIS — I69354 Hemiplegia and hemiparesis following cerebral infarction affecting left non-dominant side: Secondary | ICD-10-CM

## 2019-01-27 NOTE — Patient Instructions (Signed)
Diabetes Mellitus and Foot Care  Foot care is an important part of your health, especially when you have diabetes. Diabetes may cause you to have problems because of poor blood flow (circulation) to your feet and legs, which can cause your skin to:   Become thinner and drier.   Break more easily.   Heal more slowly.   Peel and crack.  You may also have nerve damage (neuropathy) in your legs and feet, causing decreased feeling in them. This means that you may not notice minor injuries to your feet that could lead to more serious problems. Noticing and addressing any potential problems early is the best way to prevent future foot problems.  How to care for your feet  Foot hygiene   Wash your feet daily with warm water and mild soap. Do not use hot water. Then, pat your feet and the areas between your toes until they are completely dry. Do not soak your feet as this can dry your skin.   Trim your toenails straight across. Do not dig under them or around the cuticle. File the edges of your nails with an emery board or nail file.   Apply a moisturizing lotion or petroleum jelly to the skin on your feet and to dry, brittle toenails. Use lotion that does not contain alcohol and is unscented. Do not apply lotion between your toes.  Shoes and socks   Wear clean socks or stockings every day. Make sure they are not too tight. Do not wear knee-high stockings since they may decrease blood flow to your legs.   Wear shoes that fit properly and have enough cushioning. Always look in your shoes before you put them on to be sure there are no objects inside.   To break in new shoes, wear them for just a few hours a day. This prevents injuries on your feet.  Wounds, scrapes, corns, and calluses   Check your feet daily for blisters, cuts, bruises, sores, and redness. If you cannot see the bottom of your feet, use a mirror or ask someone for help.   Do not cut corns or calluses or try to remove them with medicine.   If you  find a minor scrape, cut, or break in the skin on your feet, keep it and the skin around it clean and dry. You may clean these areas with mild soap and water. Do not clean the area with peroxide, alcohol, or iodine.   If you have a wound, scrape, corn, or callus on your foot, look at it several times a day to make sure it is healing and not infected. Check for:  ? Redness, swelling, or pain.  ? Fluid or blood.  ? Warmth.  ? Pus or a bad smell.  General instructions   Do not cross your legs. This may decrease blood flow to your feet.   Do not use heating pads or hot water bottles on your feet. They may burn your skin. If you have lost feeling in your feet or legs, you may not know this is happening until it is too late.   Protect your feet from hot and cold by wearing shoes, such as at the beach or on hot pavement.   Schedule a complete foot exam at least once a year (annually) or more often if you have foot problems. If you have foot problems, report any cuts, sores, or bruises to your health care provider immediately.  Contact a health care provider if:     You have a medical condition that increases your risk of infection and you have any cuts, sores, or bruises on your feet.   You have an injury that is not healing.   You have redness on your legs or feet.   You feel burning or tingling in your legs or feet.   You have pain or cramps in your legs and feet.   Your legs or feet are numb.   Your feet always feel cold.   You have pain around a toenail.  Get help right away if:   You have a wound, scrape, corn, or callus on your foot and:  ? You have pain, swelling, or redness that gets worse.  ? You have fluid or blood coming from the wound, scrape, corn, or callus.  ? Your wound, scrape, corn, or callus feels warm to the touch.  ? You have pus or a bad smell coming from the wound, scrape, corn, or callus.  ? You have a fever.  ? You have a red line going up your leg.  Summary   Check your feet every day  for cuts, sores, red spots, swelling, and blisters.   Moisturize feet and legs daily.   Wear shoes that fit properly and have enough cushioning.   If you have foot problems, report any cuts, sores, or bruises to your health care provider immediately.   Schedule a complete foot exam at least once a year (annually) or more often if you have foot problems.  This information is not intended to replace advice given to you by your health care provider. Make sure you discuss any questions you have with your health care provider.  Document Released: 08/10/2000 Document Revised: 09/25/2017 Document Reviewed: 09/14/2016  [employer]vier Interactive Patient Education  2019 [employer]

## 2019-01-27 NOTE — Progress Notes (Signed)

## 2019-02-01 NOTE — Progress Notes (Signed)
Subjective:  Kimberly [name] presents to clinic today for preventative diabetic foot care. She relates some numbness in feet from time to time.  [name], MD is her PCP and last visit was 01/17/2019 per patient recall.  She sees Dr. Loanne Drilling for diabetes management. Last visit was 01/14/2019.  She is inquiring about diabetic shoes on today's visit and hoping to get measured for them. She is still waiting on approval for a new AFO for LLE.  [name] is still on Coumadin daily.    Current Outpatient Medications:  .  Accu-Chek Softclix Lancets lancets, Use to monitor glucose levels once daily; E11.9, Disp: 100 each, Rfl: 3 .  baclofen (LIORESAL) 20 MG tablet, Take 20 mg by mouth 3 (three) times daily., Disp: , Rfl: 2 .  butalbital-acetaminophen-caffeine (FIORICET, ESGIC) 50-325-40 MG per tablet, Take 1 tablet by mouth 2 (two) times daily as needed for headache., Disp: , Rfl:  .  cholecalciferol (VITAMIN D) 1000 UNITS tablet, Take 1,000 Units by mouth daily., Disp: , Rfl:  .  fish oil-omega-3 fatty acids 1000 MG capsule, Take 1 g by mouth 3 (three) times daily. , Disp: , Rfl:  .  glipiZIDE (GLUCOTROL XL) 10 MG 24 hr tablet, Take 1 tablet (10 mg total) by mouth daily with breakfast., Disp: 90 tablet, Rfl: 3 .  glucose blood (ACCU-CHEK GUIDE) test strip, 1 each by Other route 2 (two) times daily. And lancets 2/day, Disp: 100 each, Rfl: 12 .  hydrocortisone cream 0.5 %, Apply topically as needed., Disp: , Rfl:  .  Insulin Degludec (TRESIBA FLEXTOUCH) 200 UNIT/ML SOPN, Inject 46 Units into the skin daily. And pen needles 1/day, Disp: 3 pen, Rfl: 11 .  lacosamide (VIMPAT) 200 MG TABS, Take by mouth 2 (two) times daily.  , Disp: , Rfl:  .  latanoprost (XALATAN) 0.005 % ophthalmic solution, INSTILL 1 DROP(S) IN [facility] EYE DAILY IN THE EVENING, Disp: , Rfl:  .  latanoprost (XALATAN) 0.005 % ophthalmic solution, INSTILL 1 DROP(S) IN [facility] EYE DAILY IN THE EVENING, Disp: , Rfl: 6 .  metFORMIN  (GLUCOPHAGE-XR) 500 MG 24 hr tablet, Take 1 tablet (500 mg total) by mouth daily with breakfast., Disp: 90 tablet, Rfl: 3 .  simvastatin (ZOCOR) 20 MG tablet, Take 40 mg by mouth every evening. , Disp: , Rfl:  .  simvastatin (ZOCOR) 20 MG tablet, Take 20 mg by mouth., Disp: , Rfl:  .  sitaGLIPtin (JANUVIA) 100 MG tablet, Take 1 tablet (100 mg total) by mouth daily., Disp: 30 tablet, Rfl: 11 .  traMADol (ULTRAM) 50 MG tablet, Take 1 tablet (50 mg total) by mouth every 6 (six) hours as needed., Disp: 15 tablet, Rfl: 0 .  warfarin (COUMADIN) 10 MG tablet, Take 10 mg by mouth daily. Monday and Friday 10 mg and all other days 5 mg, Disp: , Rfl:  .  zonisamide (ZONEGRAN) 100 MG capsule, Take 300 mg by mouth., Disp: , Rfl:    No Known Allergies   Objective:  Physical Examination:  Vascular Examination: Capillary refill time immediate x 10 digits.  Palpable DP/PT pulses b/l.  Digital hair absent x 10 digits.  Trace pedal edema noted b/l.  Skin temperature gradient WNL b/l.  Dermatological Examination: Skin with normal turgor, texture and tone b/l.  No open wounds b/l.  No interdigital macerations noted b/l.  Hyperkeratotic lesion submet head 5 left foot. No edema, no erythema, no drainage, no flocculence.   Elongated, thick, discolored brittle toenails with subungual debris and  pain on dorsal palpation of nailbeds 1-5 b/l.  Musculoskeletal Examination: Muscle strength 5/5 to all muscle groups RLE.  Flaccid LLE secondary to CVA left sided hemiplegia. Some inversion spasticity noted which most likely causes pressure on plantar aspect of 5th metatarsal contributing to callus.  No pain, crepitus or joint discomfort with active/passive ROM.  Neurological Examination: Sensation diminished 3/5 b/l with 10 gram monofilament.  Vibratory sensation diminished b/l.  Last A1c, November 12, 2018, was 9.8, down from 13.4 in September 10, 2018.  Assessment: 1. Mycotic nail infection 1-5 b/l 2.  Callus submet head 5 left foot 3. NIDDM, uncontrolled  4. Spastic hemiplegia LLE secondary to CVA 5. Patient on long term blood thinner  Plan: 1. Toenails 1-5 b/l were debrided in length and girth without iatrogenic laceration. Avoid self trimming due to use of blood thinner. 2. Calluses pared submetatarsal head(s) 5 left foot utilizing sterile scalpel blade without incident. 3. Velora Heckler discussed diabetic shoe guidelines with [name]. He has asked her to discuss need for shoes with Dr. Loanne Drilling. She would benefit from shoe as it would accommodate her AFO. Per Medicare guidelines, patient's feet need to be evaluated by an MD/DO managing patient's diabetes and diabetic shoe certification form needs to be signed by the MD/DO. If patient's diabetes is being managed by an Endocrinologist, the Endocrinologist must evaluate patient's feet and sign the Medicare diabetic shoe certification form. 4. Continue soft, supportive shoe gear daily. 5. Report any pedal injuries to medical professional. 6. Follow up 3 months. 7. Patient/POA to call should there be a question/concern in there interim.

## 2019-02-03 ENCOUNTER — Other Ambulatory Visit: Payer: Self-pay | Admitting: Endocrinology

## 2019-02-04 DIAGNOSIS — Z5181 Encounter for therapeutic drug level monitoring: Secondary | ICD-10-CM | POA: Diagnosis not present

## 2019-02-04 DIAGNOSIS — I6330 Cerebral infarction due to thrombosis of unspecified cerebral artery: Secondary | ICD-10-CM | POA: Diagnosis not present

## 2019-02-04 DIAGNOSIS — H401121 Primary open-angle glaucoma, left eye, mild stage: Secondary | ICD-10-CM | POA: Diagnosis not present

## 2019-02-04 DIAGNOSIS — Z7901 Long term (current) use of anticoagulants: Secondary | ICD-10-CM | POA: Diagnosis not present

## 2019-02-04 DIAGNOSIS — H53431 Sector or arcuate defects, right eye: Secondary | ICD-10-CM | POA: Diagnosis not present

## 2019-02-04 DIAGNOSIS — H401112 Primary open-angle glaucoma, right eye, moderate stage: Secondary | ICD-10-CM | POA: Diagnosis not present

## 2019-02-06 ENCOUNTER — Telehealth: Payer: Self-pay | Admitting: Endocrinology

## 2019-02-06 NOTE — Telephone Encounter (Signed)
Patient stated that [facility] was faxing [place] some forms for Dr.Ellison to sign for diabetic shoes.  Please Advise, Thanks

## 2019-02-12 ENCOUNTER — Telehealth: Payer: Self-pay | Admitting: Orthotics

## 2019-02-12 DIAGNOSIS — L84 Corns and callosities: Secondary | ICD-10-CM

## 2019-02-17 ENCOUNTER — Telehealth: Payer: Self-pay | Admitting: Podiatry

## 2019-02-17 NOTE — Telephone Encounter (Signed)
Returned call and informed we have not received any paperwork from [facility] for Dr. Loanne Drilling to sign nor complete.

## 2019-02-17 NOTE — Telephone Encounter (Signed)
Pt left message for someone to call her back that Dr Cordelia Pen office stated they have not received any paperwork.   I returned pts call and explained that we were told her appt was 7.1.2020 with Dr Loanne Drilling so we would not send paperwork over several weeks in advance that way it doesn't get misplaced or signed with wrong dates. I explained that it woule be sent to Dr Loanne Drilling this week for her appt next week.

## 2019-02-17 NOTE — Telephone Encounter (Signed)
Patient is calling in regards to her diabetic shoes paperwork.  Please advise with patient, Thanks

## 2019-02-18 DIAGNOSIS — E1165 Type 2 diabetes mellitus with hyperglycemia: Secondary | ICD-10-CM | POA: Diagnosis not present

## 2019-02-18 DIAGNOSIS — R569 Unspecified convulsions: Secondary | ICD-10-CM | POA: Diagnosis not present

## 2019-02-18 DIAGNOSIS — I6789 Other cerebrovascular disease: Secondary | ICD-10-CM | POA: Diagnosis not present

## 2019-02-18 DIAGNOSIS — I10 Essential (primary) hypertension: Secondary | ICD-10-CM | POA: Diagnosis not present

## 2019-02-18 DIAGNOSIS — E782 Mixed hyperlipidemia: Secondary | ICD-10-CM | POA: Diagnosis not present

## 2019-02-23 ENCOUNTER — Telehealth: Payer: Self-pay | Admitting: Podiatry

## 2019-02-23 ENCOUNTER — Other Ambulatory Visit: Payer: Self-pay

## 2019-02-23 NOTE — Telephone Encounter (Signed)
Per message from safestep Dr Loanne Drilling marked on form pt is not qualify for diabetic shoes.  I notified pt of this.

## 2019-02-25 ENCOUNTER — Ambulatory Visit (INDEPENDENT_AMBULATORY_CARE_PROVIDER_SITE_OTHER): Payer: Medicare Other | Admitting: Endocrinology

## 2019-02-25 ENCOUNTER — Other Ambulatory Visit: Payer: Self-pay

## 2019-02-25 ENCOUNTER — Encounter: Payer: Self-pay | Admitting: Endocrinology

## 2019-02-25 ENCOUNTER — Telehealth: Payer: Self-pay | Admitting: Endocrinology

## 2019-02-25 VITALS — BP 148/82 | HR 104 | Ht 63.0 in | Wt 195.0 lb

## 2019-02-25 DIAGNOSIS — Z794 Long term (current) use of insulin: Secondary | ICD-10-CM | POA: Diagnosis not present

## 2019-02-25 DIAGNOSIS — E1149 Type 2 diabetes mellitus with other diabetic neurological complication: Secondary | ICD-10-CM | POA: Diagnosis not present

## 2019-02-25 LAB — POCT GLYCOSYLATED HEMOGLOBIN (HGB A1C): Hemoglobin A1C: 9.7 % — AB (ref 4.0–5.6)

## 2019-02-25 MED ORDER — METHIMAZOLE 10 MG PO TABS: 10.0000 mg | ORAL_TABLET | Freq: Two times a day (BID) | ORAL | 1 refills | Status: DC

## 2019-02-25 MED ORDER — CANAGLIFLOZIN 300 MG PO TABS: 300.0000 mg | ORAL_TABLET | Freq: Every day | ORAL | 11 refills | Status: DC

## 2019-02-25 NOTE — Telephone Encounter (Signed)
Called Kimberly [name] to let her know that Dr. Loanne Drilling did not sign off on her DBS

## 2019-02-25 NOTE — Telephone Encounter (Signed)
please contact patient: We got test results.  Thyroid is overactive. I have sent a prescription to your pharmacy, for this. If ever you have fever while taking this new pill, stop it and call us, even if the reason is obvious, because of the risk of a rare side-effect. Please come back for a follow-up appointment in 2 weeks, rather than longer.  We'll discuss thyroid more then. Also, please have your coumadin rechecked in 5 days, because of a possible interaction with this new pill.

## 2019-02-25 NOTE — Telephone Encounter (Signed)
Called pt and made her aware of Dr. Cordelia Pen orders. While attempting to reschedule August appt, phone call was disconnected. Attempted to call to reschedule August appt but was not able to reach pt. Will continue efforts.

## 2019-02-25 NOTE — Telephone Encounter (Signed)
Pt returned call. Spoke with Curt Bears at the front desk. Curt Bears informed about the need to reschedule appt to a sooner date. Pt requested to call back to schedule.

## 2019-02-25 NOTE — Progress Notes (Signed)
Subjective:    Patient ID: Kimberly [name], female    DOB: [birth year], 58 y.o.   MRN: [id number]  HPI Pt returns for f/u of DM: DM type: Insulin-requiring type 2.   Dx'ed: 3299 Complications: CVA Therapy: 3 oral meds, and insulin since 2019.   GDM: never DKA: never Severe hypoglycemia: never.  Pancreatitis: never Pancreatic imaging: never Other: she also took insulin 2011-2017; she stopped insulin due to n/v, which she attributed to lantus; she tolerates tresiba well.   Interval history: pt says she never misses the medication.  Meter is downloaded today, and the printout is scanned into the record.  cbg varies from 135-227.  Almost all are checked fasting.   pt states she feels well in general.  She wants to reduce insulin and/or increase non-insulin rx if possible.   Past Medical History:  Diagnosis Date  . Diabetes mellitus   . Hypertension   . Seizures ([place])    last seizure march 2016  . Stroke [facility])     Past Surgical History:  Procedure Laterality Date  . TRACHEOSTOMY CLOSURE      Social History   Socioeconomic History  . Marital status: Married    Spouse name: Not on file  . Number of children: Not on file  . Years of education: Not on file  . Highest education level: Not on file  Occupational History  . Not on file  Social Needs  . Financial resource strain: Not on file  . Food insecurity    Worry: Not on file    Inability: Not on file  . Transportation needs    Medical: Not on file    Non-medical: Not on file  Tobacco Use  . Smoking status: Never Smoker  . Smokeless tobacco: Never Used  Substance and Sexual Activity  . Alcohol use: No  . Drug use: No  . Sexual activity: Not on file  Lifestyle  . Physical activity    Days per week: Not on file    Minutes per session: Not on file  . Stress: Not on file  Relationships  . Social Herbalist on phone: Not on file    Gets together: Not on file    Attends religious service: Not on file   Active member of club or organization: Not on file    Attends meetings of clubs or organizations: Not on file    Relationship status: Not on file  . Intimate partner violence    Fear of current or ex partner: Not on file    Emotionally abused: Not on file    Physically abused: Not on file    Forced sexual activity: Not on file  Other Topics Concern  . Not on file  Social History Narrative  . Not on file    Current Outpatient Medications on File Prior to Visit  Medication Sig Dispense Refill  . Accu-Chek Softclix Lancets lancets Use to monitor glucose levels once daily; E11.9 100 each 3  . baclofen (LIORESAL) 20 MG tablet Take 20 mg by mouth 3 (three) times daily.  2  . butalbital-acetaminophen-caffeine (FIORICET, ESGIC) 50-325-40 MG per tablet Take 1 tablet by mouth 2 (two) times daily as needed for headache.    . cholecalciferol (VITAMIN D) 1000 UNITS tablet Take 1,000 Units by mouth daily.    . fish oil-omega-3 fatty acids 1000 MG capsule Take 1 g by mouth 3 (three) times daily.     Marland Kitchen glipiZIDE (GLUCOTROL XL) 10 MG 24  hr tablet Take 1 tablet (10 mg total) by mouth daily with breakfast. 90 tablet 3  . glucose blood (ACCU-CHEK GUIDE) test strip 1 each by Other route 2 (two) times daily. And lancets 2/day 100 each 12  . hydrocortisone cream 0.5 % Apply topically as needed.    . Insulin Degludec (TRESIBA FLEXTOUCH) 200 UNIT/ML SOPN Inject 46 Units into the skin daily. And pen needles 1/day 3 pen 11  . lacosamide (VIMPAT) 200 MG TABS Take by mouth 2 (two) times daily.      Marland Kitchen latanoprost (XALATAN) 0.005 % ophthalmic solution INSTILL 1 DROP(S) IN EACH EYE DAILY IN THE EVENING    . latanoprost (XALATAN) 0.005 % ophthalmic solution INSTILL 1 DROP(S) IN EACH EYE DAILY IN THE EVENING  6  . metFORMIN (GLUCOPHAGE-XR) 500 MG 24 hr tablet Take 1 tablet (500 mg total) by mouth daily with breakfast. 90 tablet 3  . simvastatin (ZOCOR) 20 MG tablet Take 40 mg by mouth every evening.     . simvastatin  (ZOCOR) 20 MG tablet Take 20 mg by mouth.    . sitaGLIPtin (JANUVIA) 100 MG tablet Take 1 tablet (100 mg total) by mouth daily. 30 tablet 11  . traMADol (ULTRAM) 50 MG tablet Take 1 tablet (50 mg total) by mouth every 6 (six) hours as needed. 15 tablet 0  . warfarin (COUMADIN) 10 MG tablet Take 10 mg by mouth daily. Monday and Friday 10 mg and all other days 5 mg    . zonisamide (ZONEGRAN) 100 MG capsule Take 300 mg by mouth.     No current facility-administered medications on file prior to visit.     No Known Allergies  Family History  Problem Relation Age of Onset  . Cancer Father   . Diabetes Neg Hx     BP (!) 148/82 (BP Location: Left Arm, Patient Position: Sitting, Cuff Size: Large)   Pulse (!) 104   Ht 5\' 3"  (1.6 m)   Wt 195 lb (88.5 kg) Comment: verbalized  SpO2 97%   BMI 34.54 kg/m    Review of Systems She denies hypoglycemia    Objective:   Physical Exam VITAL SIGNS:  See vs page GENERAL: no distress Pulses: dorsalis pedis intact bilat.   MSK: no deformity of the feet CV: trace bilat leg edema Skin:  no ulcer on the feet.  normal color and temp on the feet. Neuro: sensation is intact to touch on the feet  Lab Results  Component Value Date   CREATININE 0.76 12/24/2017   BUN 17 12/24/2017   NA 135 12/24/2017   K 3.9 12/24/2017   CL 103 12/24/2017   CO2 20 (L) 12/24/2017     Lab Results  Component Value Date   HGBA1C 9.7 (A) 02/25/2019   outside test results are reviewed: TSH is underectable FTI=5.7    Assessment & Plan:  Insulin-requiring type 2 DM: she needs increased rx Hyperthyroidism, new, uncertain etiology.  HTN: recheck next time.  I rx'ed tapazole. If ever you have fever while taking methimazole, stop it and call us, even if the reason is obvious, because of the risk of a rare side-effect. Patient Instructions  check your blood sugar once a day.  vary the time of day when you check, between before the 3 meals, and at bedtime.  also  check if you have symptoms of your blood sugar being too high or too low.  please keep a record of the readings and bring it to your next  appointment here (or you can bring the meter itself).  You can write it on any piece of paper.  please call us sooner if your blood sugar goes below 70, or if you have a lot of readings over 200.  I have sent a prescription to your pharmacy, to add "Invokana." Please continue the same other diabetes medications Please come back for a follow-up appointment in 1 month.

## 2019-02-25 NOTE — Patient Instructions (Addendum)
check your blood sugar once a day.  vary the time of day when you check, between before the 3 meals, and at bedtime.  also check if you have symptoms of your blood sugar being too high or too low.  please keep a record of the readings and bring it to your next appointment here (or you can bring the meter itself).  You can write it on any piece of paper.  please call us sooner if your blood sugar goes below 70, or if you have a lot of readings over 200.  I have sent a prescription to your pharmacy, to add "Invokana." Please continue the same other diabetes medications Please come back for a follow-up appointment in 1 month.

## 2019-03-09 ENCOUNTER — Other Ambulatory Visit: Payer: Self-pay

## 2019-03-11 ENCOUNTER — Encounter: Payer: Self-pay | Admitting: Endocrinology

## 2019-03-11 ENCOUNTER — Ambulatory Visit (INDEPENDENT_AMBULATORY_CARE_PROVIDER_SITE_OTHER): Payer: Medicare Other | Admitting: Endocrinology

## 2019-03-11 ENCOUNTER — Other Ambulatory Visit: Payer: Self-pay

## 2019-03-11 DIAGNOSIS — E0590 Thyrotoxicosis, unspecified without thyrotoxic crisis or storm: Secondary | ICD-10-CM | POA: Insufficient documentation

## 2019-03-11 DIAGNOSIS — Z794 Long term (current) use of insulin: Secondary | ICD-10-CM

## 2019-03-11 DIAGNOSIS — Z5181 Encounter for therapeutic drug level monitoring: Secondary | ICD-10-CM | POA: Diagnosis not present

## 2019-03-11 DIAGNOSIS — Z7901 Long term (current) use of anticoagulants: Secondary | ICD-10-CM | POA: Diagnosis not present

## 2019-03-11 DIAGNOSIS — E119 Type 2 diabetes mellitus without complications: Secondary | ICD-10-CM

## 2019-03-11 DIAGNOSIS — I6330 Cerebral infarction due to thrombosis of unspecified cerebral artery: Secondary | ICD-10-CM | POA: Diagnosis not present

## 2019-03-11 DIAGNOSIS — R791 Abnormal coagulation profile: Secondary | ICD-10-CM | POA: Diagnosis not present

## 2019-03-11 MED ORDER — METFORMIN HCL ER 500 MG PO TB24: 500.0000 mg | ORAL_TABLET | Freq: Every day | ORAL | 3 refills | Status: DC

## 2019-03-11 NOTE — Progress Notes (Signed)
Subjective:    Patient ID: Kimberly [name], female    DOB: Jan 18, 1961, 58 y.o.   MRN: [id number]  HPI Pt returns for f/u of DM: DM type: Insulin-requiring type 2.   Dx'ed: 1027 Complications: CVA Therapy: 3 oral meds, and insulin since 2019.   GDM: never DKA: never Severe hypoglycemia: never.  Pancreatitis: never Pancreatic imaging: never Other: she also took insulin 2011-2017; she declines multiple daily injections; she stopped insulin due to n/v, which she attributed to lantus; she tolerates tresiba well;  she wants to reduce insulin and/or increase non-insulin rx if possible; edema limits rx options.   Interval history: pt says she never misses the medication.  Meter is downloaded today, and the printout is scanned into the record.  cbg varies from 105-260.  Almost all are checked fasting and lunch.    Pt was dx'ed with hyperthyroidism in 2020, at [facility].  Pt says she also had hyperthyroidism in 2010.  Pt says she was rx'ed with dietary rx, and it resolved.  She was rx'ed tapazole 2 weeks ago.  she has never had XRT to the anterior neck, or thyroid surgery.  she has never had dedicated thyroid imaging.  she has never been on amiodarone.  She reports slightly excessive appetite, but no assoc excessive diaphoresis, weight loss, itching, diplopia, anxiety, heat intolerance, diarrhea, or palpitations.   Past Medical History:  Diagnosis Date  . Diabetes mellitus   . Hypertension   . Seizures ([place])    last seizure march 2016  . Stroke [facility])     Past Surgical History:  Procedure Laterality Date  . TRACHEOSTOMY CLOSURE      Social History   Socioeconomic History  . Marital status: Married    Spouse name: Not on file  . Number of children: Not on file  . Years of education: Not on file  . Highest education level: Not on file  Occupational History  . Not on file  Social Needs  . Financial resource strain: Not on file  . Food insecurity    Worry: Not on file   Inability: Not on file  . Transportation needs    Medical: Not on file    Non-medical: Not on file  Tobacco Use  . Smoking status: Never Smoker  . Smokeless tobacco: Never Used  Substance and Sexual Activity  . Alcohol use: No  . Drug use: No  . Sexual activity: Not on file  Lifestyle  . Physical activity    Days per week: Not on file    Minutes per session: Not on file  . Stress: Not on file  Relationships  . Social Herbalist on phone: Not on file    Gets together: Not on file    Attends religious service: Not on file    Active member of club or organization: Not on file    Attends meetings of clubs or organizations: Not on file    Relationship status: Not on file  . Intimate partner violence    Fear of current or ex partner: Not on file    Emotionally abused: Not on file    Physically abused: Not on file    Forced sexual activity: Not on file  Other Topics Concern  . Not on file  Social History Narrative  . Not on file    Current Outpatient Medications on File Prior to Visit  Medication Sig Dispense Refill  . Accu-Chek Softclix Lancets lancets Use to monitor glucose  levels once daily; E11.9 100 each 3  . baclofen (LIORESAL) 20 MG tablet Take 20 mg by mouth 3 (three) times daily.  2  . butalbital-acetaminophen-caffeine (FIORICET, ESGIC) 50-325-40 MG per tablet Take 1 tablet by mouth 2 (two) times daily as needed for headache.    . canagliflozin (INVOKANA) 300 MG TABS tablet Take 1 tablet (300 mg total) by mouth daily before breakfast. 30 tablet 11  . cholecalciferol (VITAMIN D) 1000 UNITS tablet Take 1,000 Units by mouth daily.    . fish oil-omega-3 fatty acids 1000 MG capsule Take 1 g by mouth 3 (three) times daily.     Marland Kitchen glipiZIDE (GLUCOTROL XL) 10 MG 24 hr tablet Take 1 tablet (10 mg total) by mouth daily with breakfast. 90 tablet 3  . glucose blood (ACCU-CHEK GUIDE) test strip 1 each by Other route 2 (two) times daily. And lancets 2/day 100 each 12  .  hydrocortisone cream 0.5 % Apply topically as needed.    . Insulin Degludec (TRESIBA FLEXTOUCH) 200 UNIT/ML SOPN Inject 46 Units into the skin daily. And pen needles 1/day 3 pen 11  . lacosamide (VIMPAT) 200 MG TABS Take by mouth 2 (two) times daily.      Marland Kitchen latanoprost (XALATAN) 0.005 % ophthalmic solution INSTILL 1 DROP(S) IN EACH EYE DAILY IN THE EVENING    . latanoprost (XALATAN) 0.005 % ophthalmic solution INSTILL 1 DROP(S) IN EACH EYE DAILY IN THE EVENING  6  . methimazole (TAPAZOLE) 10 MG tablet Take 1 tablet (10 mg total) by mouth 2 (two) times daily. 60 tablet 1  . simvastatin (ZOCOR) 20 MG tablet Take 40 mg by mouth every evening.     . simvastatin (ZOCOR) 20 MG tablet Take 20 mg by mouth.    . sitaGLIPtin (JANUVIA) 100 MG tablet Take 1 tablet (100 mg total) by mouth daily. 30 tablet 11  . traMADol (ULTRAM) 50 MG tablet Take 1 tablet (50 mg total) by mouth every 6 (six) hours as needed. 15 tablet 0  . warfarin (COUMADIN) 10 MG tablet Take 10 mg by mouth daily. Monday and Friday 10 mg and all other days 5 mg    . zonisamide (ZONEGRAN) 100 MG capsule Take 300 mg by mouth.     No current facility-administered medications on file prior to visit.     No Known Allergies  Family History  Problem Relation Age of Onset  . Cancer Father   . Diabetes Neg Hx     BP 132/70 (BP Location: Left Arm, Patient Position: Sitting, Cuff Size: Large)   Pulse 92   Ht 5\' 3"  (1.6 m)   Wt 195 lb (88.5 kg)   SpO2 96%   BMI 34.54 kg/m    Review of Systems Denies fever and tremor.      Objective:   Physical Exam VITAL SIGNS:  See vs page GENERAL: no distress.  In wheelchair NECK: thyroid is slightly enlarged on the right, and normal on the left.  No thyroid nodule is palpable.  No palpable lymphadenopathy at the anterior neck.  Lab Results  Component Value Date   CREATININE 0.76 12/24/2017   BUN 17 12/24/2017   NA 135 12/24/2017   K 3.9 12/24/2017   CL 103 12/24/2017   CO2 20 (L)  12/24/2017   Lab Results  Component Value Date   HGBA1C 9.7 (A) 02/25/2019   Lab Results  Component Value Date   TSH <0.01 Repeated and verified X2. (L) 03/11/2019  Assessment & Plan:  Hyperthyroidism: new to me.  Please continue the same medication for now.  Insulin-requiring type 2 DM, with CVA: glycemic control is apparently improved.    Patient Instructions  check your blood sugar twice a day.  vary the time of day when you check, between before the 3 meals, and at bedtime.  also check if you have symptoms of your blood sugar being too high or too low.  please keep a record of the readings and bring it to your next appointment here (or you can bring the meter itself).  You can write it on any piece of paper.  please call us sooner if your blood sugar goes below 70, or if you have a lot of readings over 200.  Please continue the same diabetes medications. Blood tests are requested for you today.  We'll let you know about the results.  If ever you have fever while taking methimazole, stop it and call us, even if the reason is obvious, because of the risk of a rare side-effect.   Please come back for a follow-up appointment in 6 weeks.

## 2019-03-11 NOTE — Patient Instructions (Addendum)
check your blood sugar twice a day.  vary the time of day when you check, between before the 3 meals, and at bedtime.  also check if you have symptoms of your blood sugar being too high or too low.  please keep a record of the readings and bring it to your next appointment here (or you can bring the meter itself).  You can write it on any piece of paper.  please call us sooner if your blood sugar goes below 70, or if you have a lot of readings over 200.  Please continue the same diabetes medications. Blood tests are requested for you today.  We'll let you know about the results.  If ever you have fever while taking methimazole, stop it and call us, even if the reason is obvious, because of the risk of a rare side-effect.   Please come back for a follow-up appointment in 6 weeks.

## 2019-03-12 LAB — BASIC METABOLIC PANEL WITH GFR
BUN: 14 mg/dL (ref 6–23)
CO2: 27 meq/L (ref 19–32)
Calcium: 9.9 mg/dL (ref 8.4–10.5)
Chloride: 104 meq/L (ref 96–112)
Creatinine, Ser: 0.57 mg/dL (ref 0.40–1.20)
GFR: 132.46 mL/min
Glucose, Bld: 144 mg/dL — ABNORMAL HIGH (ref 70–99)
Potassium: 4.2 meq/L (ref 3.5–5.1)
Sodium: 139 meq/L (ref 135–145)

## 2019-03-12 LAB — T4, FREE: Free T4: 1.38 ng/dL (ref 0.60–1.60)

## 2019-03-12 LAB — TSH: TSH: <0.01 u[IU]/mL — ABNORMAL LOW (ref 0.35–4.50)

## 2019-03-13 DIAGNOSIS — M79642 Pain in left hand: Secondary | ICD-10-CM | POA: Diagnosis not present

## 2019-03-13 DIAGNOSIS — M25512 Pain in left shoulder: Secondary | ICD-10-CM | POA: Diagnosis not present

## 2019-03-15 ENCOUNTER — Encounter: Payer: Self-pay | Admitting: Endocrinology

## 2019-03-18 ENCOUNTER — Telehealth: Payer: Self-pay | Admitting: Podiatry

## 2019-03-18 DIAGNOSIS — E782 Mixed hyperlipidemia: Secondary | ICD-10-CM | POA: Diagnosis not present

## 2019-03-18 DIAGNOSIS — E1165 Type 2 diabetes mellitus with hyperglycemia: Secondary | ICD-10-CM | POA: Diagnosis not present

## 2019-03-18 DIAGNOSIS — I10 Essential (primary) hypertension: Secondary | ICD-10-CM | POA: Diagnosis not present

## 2019-03-18 DIAGNOSIS — R569 Unspecified convulsions: Secondary | ICD-10-CM | POA: Diagnosis not present

## 2019-03-18 DIAGNOSIS — I6789 Other cerebrovascular disease: Secondary | ICD-10-CM | POA: Diagnosis not present

## 2019-03-18 NOTE — Telephone Encounter (Signed)
Pt called and stated she just seen her primary care provider and she said she would sign off on the paperwork for the diabetic shoes. She then told me it was Raelyn Number and upon checking she is a Pa and I explained that a NP or Pa per medicare is not allowed to sign paperwork. She said I was making it hard for her to get the shoes and she is allowed to get shoes once a year. I explained it was medicare guidelines that we have to follow. She then said she seen Dr Orma Render a week ago. I told her I would send the paperwork and see if we can get it taken care of. FYI Dr Loanne Drilling stated pt did not qualify for the shoes.

## 2019-03-19 ENCOUNTER — Other Ambulatory Visit: Payer: Self-pay | Admitting: Endocrinology

## 2019-03-19 DIAGNOSIS — M79642 Pain in left hand: Secondary | ICD-10-CM | POA: Diagnosis not present

## 2019-03-20 ENCOUNTER — Telehealth: Payer: Self-pay | Admitting: Endocrinology

## 2019-03-20 NOTE — Telephone Encounter (Signed)
Patient is feeling ill - nauseated-and feels that the Metformin is causing the above symptoms. Please call patient at ph# [phone] to advise.

## 2019-03-20 NOTE — Telephone Encounter (Signed)
Called pt and informed of new orders. Scheduled to be seen 7/30

## 2019-03-20 NOTE — Telephone Encounter (Signed)
D/c metformin. Please move up f/u ov to next week

## 2019-03-20 NOTE — Telephone Encounter (Signed)
Please advise 

## 2019-03-26 ENCOUNTER — Other Ambulatory Visit: Payer: Self-pay

## 2019-03-26 ENCOUNTER — Ambulatory Visit (INDEPENDENT_AMBULATORY_CARE_PROVIDER_SITE_OTHER): Payer: Medicare Other | Admitting: Endocrinology

## 2019-03-26 ENCOUNTER — Encounter: Payer: Self-pay | Admitting: Endocrinology

## 2019-03-26 VITALS — BP 130/78 | HR 81 | Ht 63.0 in

## 2019-03-26 DIAGNOSIS — E1149 Type 2 diabetes mellitus with other diabetic neurological complication: Secondary | ICD-10-CM | POA: Diagnosis not present

## 2019-03-26 DIAGNOSIS — Z794 Long term (current) use of insulin: Secondary | ICD-10-CM | POA: Diagnosis not present

## 2019-03-26 DIAGNOSIS — E0590 Thyrotoxicosis, unspecified without thyrotoxic crisis or storm: Secondary | ICD-10-CM

## 2019-03-26 MED ORDER — GLIPIZIDE 5 MG PO TABS: 5.0000 mg | ORAL_TABLET | Freq: Every day | ORAL | 5 refills | Status: DC

## 2019-03-26 NOTE — Progress Notes (Signed)
Subjective:    Patient ID: Kimberly [name], female    DOB: 13-Feb-1961, 58 y.o.   MRN: [id number]  HPI Pt returns for f/u of DM: DM type: Insulin-requiring type 2.   Dx'ed: 5631 Complications: CVA Therapy: 3 oral meds, and insulin since 2019.   GDM: never DKA: never Severe hypoglycemia: never.  Pancreatitis: never Pancreatic imaging: never Other: she also took insulin 2011-2017; she declines multiple daily injections; she stopped insulin due to n/v, which she attributed to lantus; she tolerates tresiba well;  she wants to reduce insulin and/or increase non-insulin rx if possible; edema limits rx options; she did not tolerate metformin (nausea).    Interval history: Meter is downloaded today, and the printout is scanned into the record.  cbg varies from 88-257, but most are in the 100's.  It is in general higher as the day goes on.  Pt also has hyperthyroidism (dx'ed 2020; pt says she also had hyperthyroidism in 2010; she was rx'ed with dietary rx, and it resolved; she was rx'ed tapazole in 2020; she has never had dedicated thyroid imaging; she chose tapazole rx).  Past Medical History:  Diagnosis Date  . Diabetes mellitus   . Hypertension   . Seizures ([place])    last seizure march 2016  . Stroke [facility])     Past Surgical History:  Procedure Laterality Date  . TRACHEOSTOMY CLOSURE      Social History   Socioeconomic History  . Marital status: Married    Spouse name: Not on file  . Number of children: Not on file  . Years of education: Not on file  . Highest education level: Not on file  Occupational History  . Not on file  Social Needs  . Financial resource strain: Not on file  . Food insecurity    Worry: Not on file    Inability: Not on file  . Transportation needs    Medical: Not on file    Non-medical: Not on file  Tobacco Use  . Smoking status: Never Smoker  . Smokeless tobacco: Never Used  Substance and Sexual Activity  . Alcohol use: No  . Drug use: No  . Sexual  activity: Not on file  Lifestyle  . Physical activity    Days per week: Not on file    Minutes per session: Not on file  . Stress: Not on file  Relationships  . Social Herbalist on phone: Not on file    Gets together: Not on file    Attends religious service: Not on file    Active member of club or organization: Not on file    Attends meetings of clubs or organizations: Not on file    Relationship status: Not on file  . Intimate partner violence    Fear of current or ex partner: Not on file    Emotionally abused: Not on file    Physically abused: Not on file    Forced sexual activity: Not on file  Other Topics Concern  . Not on file  Social History Narrative  . Not on file    Current Outpatient Medications on File Prior to Visit  Medication Sig Dispense Refill  . Accu-Chek Softclix Lancets lancets Use to monitor glucose levels once daily; E11.9 100 each 3  . baclofen (LIORESAL) 20 MG tablet Take 20 mg by mouth 3 (three) times daily.  2  . butalbital-acetaminophen-caffeine (FIORICET, ESGIC) 50-325-40 MG per tablet Take 1 tablet by mouth 2 (two) times  daily as needed for headache.    . canagliflozin (INVOKANA) 300 MG TABS tablet Take 1 tablet (300 mg total) by mouth daily before breakfast. 30 tablet 11  . cholecalciferol (VITAMIN D) 1000 UNITS tablet Take 1,000 Units by mouth daily.    . fish oil-omega-3 fatty acids 1000 MG capsule Take 1 g by mouth 3 (three) times daily.     Marland Kitchen glucose blood (ACCU-CHEK GUIDE) test strip 1 each by Other route 2 (two) times daily. And lancets 2/day 100 each 12  . hydrocortisone cream 0.5 % Apply topically as needed.    . Insulin Degludec (TRESIBA FLEXTOUCH) 200 UNIT/ML SOPN Inject 46 Units into the skin daily. And pen needles 1/day 3 pen 11  . lacosamide (VIMPAT) 200 MG TABS Take by mouth 2 (two) times daily.      Marland Kitchen latanoprost (XALATAN) 0.005 % ophthalmic solution INSTILL 1 DROP(S) IN EACH EYE DAILY IN THE EVENING    . simvastatin  (ZOCOR) 20 MG tablet Take 40 mg by mouth every evening.     . simvastatin (ZOCOR) 20 MG tablet Take 20 mg by mouth.    . sitaGLIPtin (JANUVIA) 100 MG tablet Take 1 tablet (100 mg total) by mouth daily. 30 tablet 11  . traMADol (ULTRAM) 50 MG tablet Take 1 tablet (50 mg total) by mouth every 6 (six) hours as needed. 15 tablet 0  . warfarin (COUMADIN) 10 MG tablet Take 10 mg by mouth daily. Monday and Friday 10 mg and all other days 5 mg    . zonisamide (ZONEGRAN) 100 MG capsule Take 300 mg by mouth.     No current facility-administered medications on file prior to visit.     No Known Allergies  Family History  Problem Relation Age of Onset  . Cancer Father   . Diabetes Neg Hx     BP 130/78   Pulse 81   Ht 5\' 3"  (1.6 m)   SpO2 90%   BMI 34.54 kg/m     Review of Systems Denies fever.    Objective:   Physical Exam VITAL SIGNS:  See vs page GENERAL: no distress.  In wheelchair. Pulses: dorsalis pedis intact bilat.   MSK: no deformity of the feet CV: no leg edema Skin:  no ulcer on the feet.  normal color and temp on the feet. Neuro: sensation is intact to touch on the feet.  Lab Results  Component Value Date   TSH 0.02 (L) 03/26/2019      Assessment & Plan:  Insulin-requiring type 2 DM, with CVA: uncertain glycemic control.  For now, we'll reduce glipizide, as it is redundant with insulin.  Hyperthyroidism: worse despite rx: I have sent a prescription to your pharmacy, to increase tapazole.    Patient Instructions  check your blood sugar twice a day.  vary the time of day when you check, between before the 3 meals, and at bedtime.  also check if you have symptoms of your blood sugar being too high or too low.  please keep a record of the readings and bring it to your next appointment here (or you can bring the meter itself).  You can write it on any piece of paper.  please call us sooner if your blood sugar goes below 70, or if you have a lot of readings over 200.  Please  reduce the glipizide to 5 mg each morning, and:  Please continue the same other diabetes medications.  Blood tests are requested for you today.  We'll let you know about the results.  If ever you have fever while taking methimazole, stop it and call us, even if the reason is obvious, because of the risk of a rare side-effect.     Please come back for a follow-up appointment in 1 month.

## 2019-03-26 NOTE — Patient Instructions (Addendum)
check your blood sugar twice a day.  vary the time of day when you check, between before the 3 meals, and at bedtime.  also check if you have symptoms of your blood sugar being too high or too low.  please keep a record of the readings and bring it to your next appointment here (or you can bring the meter itself).  You can write it on any piece of paper.  please call us sooner if your blood sugar goes below 70, or if you have a lot of readings over 200.  Please reduce the glipizide to 5 mg each morning, and:  Please continue the same other diabetes medications.  Blood tests are requested for you today.  We'll let you know about the results.  If ever you have fever while taking methimazole, stop it and call us, even if the reason is obvious, because of the risk of a rare side-effect.     Please come back for a follow-up appointment in 1 month.

## 2019-03-27 LAB — TSH: TSH: 0.02 u[IU]/mL — ABNORMAL LOW (ref 0.35–4.50)

## 2019-03-27 LAB — T4, FREE: Free T4: 0.96 ng/dL (ref 0.60–1.60)

## 2019-03-27 MED ORDER — METHIMAZOLE 10 MG PO TABS: 20.0000 mg | ORAL_TABLET | Freq: Two times a day (BID) | ORAL | 2 refills | Status: DC

## 2019-03-31 LAB — FRUCTOSAMINE: Fructosamine: 301 umol/L — ABNORMAL HIGH (ref 205–285)

## 2019-04-01 DIAGNOSIS — Z7901 Long term (current) use of anticoagulants: Secondary | ICD-10-CM | POA: Diagnosis not present

## 2019-04-01 DIAGNOSIS — Z5181 Encounter for therapeutic drug level monitoring: Secondary | ICD-10-CM | POA: Diagnosis not present

## 2019-04-01 DIAGNOSIS — R791 Abnormal coagulation profile: Secondary | ICD-10-CM | POA: Diagnosis not present

## 2019-04-01 DIAGNOSIS — I6330 Cerebral infarction due to thrombosis of unspecified cerebral artery: Secondary | ICD-10-CM | POA: Diagnosis not present

## 2019-04-08 DIAGNOSIS — R791 Abnormal coagulation profile: Secondary | ICD-10-CM | POA: Diagnosis not present

## 2019-04-08 DIAGNOSIS — Z5181 Encounter for therapeutic drug level monitoring: Secondary | ICD-10-CM | POA: Diagnosis not present

## 2019-04-08 DIAGNOSIS — Z7901 Long term (current) use of anticoagulants: Secondary | ICD-10-CM | POA: Diagnosis not present

## 2019-04-08 DIAGNOSIS — I6330 Cerebral infarction due to thrombosis of unspecified cerebral artery: Secondary | ICD-10-CM | POA: Diagnosis not present

## 2019-04-12 ENCOUNTER — Other Ambulatory Visit: Payer: Self-pay | Admitting: Endocrinology

## 2019-04-12 NOTE — Telephone Encounter (Signed)
Pleaser refill prn.  Dosage is 2x10mg , BID

## 2019-04-13 DIAGNOSIS — Z5181 Encounter for therapeutic drug level monitoring: Secondary | ICD-10-CM | POA: Diagnosis not present

## 2019-04-13 DIAGNOSIS — Z7901 Long term (current) use of anticoagulants: Secondary | ICD-10-CM | POA: Diagnosis not present

## 2019-04-13 DIAGNOSIS — I6330 Cerebral infarction due to thrombosis of unspecified cerebral artery: Secondary | ICD-10-CM | POA: Diagnosis not present

## 2019-04-15 ENCOUNTER — Other Ambulatory Visit: Payer: Self-pay

## 2019-04-15 ENCOUNTER — Ambulatory Visit: Payer: Medicare Other | Admitting: Orthotics

## 2019-04-15 ENCOUNTER — Ambulatory Visit: Payer: Medicare Other | Admitting: Endocrinology

## 2019-04-22 ENCOUNTER — Encounter: Payer: Self-pay | Admitting: Endocrinology

## 2019-04-22 ENCOUNTER — Ambulatory Visit (INDEPENDENT_AMBULATORY_CARE_PROVIDER_SITE_OTHER): Payer: Medicare Other | Admitting: Endocrinology

## 2019-04-22 ENCOUNTER — Other Ambulatory Visit: Payer: Self-pay

## 2019-04-22 VITALS — BP 130/78 | HR 79 | Ht 63.0 in

## 2019-04-22 DIAGNOSIS — E1149 Type 2 diabetes mellitus with other diabetic neurological complication: Secondary | ICD-10-CM

## 2019-04-22 DIAGNOSIS — I6330 Cerebral infarction due to thrombosis of unspecified cerebral artery: Secondary | ICD-10-CM | POA: Diagnosis not present

## 2019-04-22 DIAGNOSIS — Z794 Long term (current) use of insulin: Secondary | ICD-10-CM

## 2019-04-22 DIAGNOSIS — R791 Abnormal coagulation profile: Secondary | ICD-10-CM | POA: Diagnosis not present

## 2019-04-22 DIAGNOSIS — Z7901 Long term (current) use of anticoagulants: Secondary | ICD-10-CM | POA: Diagnosis not present

## 2019-04-22 DIAGNOSIS — E0590 Thyrotoxicosis, unspecified without thyrotoxic crisis or storm: Secondary | ICD-10-CM | POA: Diagnosis not present

## 2019-04-22 DIAGNOSIS — Z5181 Encounter for therapeutic drug level monitoring: Secondary | ICD-10-CM | POA: Diagnosis not present

## 2019-04-22 LAB — TSH: TSH: 1 u[IU]/mL (ref 0.35–4.50)

## 2019-04-22 LAB — POCT GLYCOSYLATED HEMOGLOBIN (HGB A1C): Hemoglobin A1C: 7 % — AB (ref 4.0–5.6)

## 2019-04-22 LAB — T4, FREE: Free T4: 0.47 ng/dL — ABNORMAL LOW (ref 0.60–1.60)

## 2019-04-22 MED ORDER — GLIPIZIDE ER 2.5 MG PO TB24: 2.5000 mg | ORAL_TABLET | Freq: Every day | ORAL | 3 refills | Status: DC

## 2019-04-22 NOTE — Patient Instructions (Addendum)
check your blood sugar twice a day.  vary the time of day when you check, between before the 3 meals, and at bedtime.  also check if you have symptoms of your blood sugar being too high or too low.  please keep a record of the readings and bring it to your next appointment here (or you can bring the meter itself).  You can write it on any piece of paper.  please call us sooner if your blood sugar goes below 70, or if you have a lot of readings over 200.  Please reduce the glipizide to 2.5 mg each morning, and:  Please continue the same other diabetes medications, including the insulin.  Thyroid blood tests are requested for you today.  We'll let you know about the results.  If ever you have fever while taking methimazole, stop it and call us, even if the reason is obvious, because of the risk of a rare side-effect.     Please come back for a follow-up appointment in 2 months.

## 2019-04-22 NOTE — Progress Notes (Signed)
Subjective:    Patient ID: Kimberly [name], female    DOB: Aug 05, 1961, 58 y.o.   MRN: [id number]  HPI Pt returns for f/u of DM: DM type: Insulin-requiring type 2.   Dx'ed: [id number] Complications: CVA Therapy: 3 oral meds, and insulin since 2019.   GDM: never DKA: never Severe hypoglycemia: never.  Pancreatitis: never Pancreatic imaging: never Other: she also took insulin 2011-2017; she declines multiple daily injections; she stopped lantus due to n/v; she tolerates tresiba well;  she wants to reduce insulin and/or increase non-insulin rx if possible; edema limits rx options; she did not tolerate metformin (nausea).    Interval history: Meter is downloaded today, and the printout is scanned into the record.  cbg varies from 89-100's. There is no trend throughout the day.   Pt also has hyperthyroidism (dx'ed 2020; pt says she also had hyperthyroidism in 2010; it resolved; she was rx'ed tapazole in 2020 for recurrence; she has never had dedicated thyroid imaging; she chose tapazole rx).   Past Medical History:  Diagnosis Date  . Diabetes mellitus   . Hypertension   . Seizures ([place])    last seizure march 2016  . Stroke [facility])     Past Surgical History:  Procedure Laterality Date  . TRACHEOSTOMY CLOSURE      Social History   Socioeconomic History  . Marital status: Married    Spouse name: Not on file  . Number of children: Not on file  . Years of education: Not on file  . Highest education level: Not on file  Occupational History  . Not on file  Social Needs  . Financial resource strain: Not on file  . Food insecurity    Worry: Not on file    Inability: Not on file  . Transportation needs    Medical: Not on file    Non-medical: Not on file  Tobacco Use  . Smoking status: Never Smoker  . Smokeless tobacco: Never Used  Substance and Sexual Activity  . Alcohol use: No  . Drug use: No  . Sexual activity: Not on file  Lifestyle  . Physical activity    Days per week: Not on  file    Minutes per session: Not on file  . Stress: Not on file  Relationships  . Social Herbalist on phone: Not on file    Gets together: Not on file    Attends religious service: Not on file    Active member of club or organization: Not on file    Attends meetings of clubs or organizations: Not on file    Relationship status: Not on file  . Intimate partner violence    Fear of current or ex partner: Not on file    Emotionally abused: Not on file    Physically abused: Not on file    Forced sexual activity: Not on file  Other Topics Concern  . Not on file  Social History Narrative  . Not on file    Current Outpatient Medications on File Prior to Visit  Medication Sig Dispense Refill  . Accu-Chek Softclix Lancets lancets Use to monitor glucose levels once daily; E11.9 100 each 3  . baclofen (LIORESAL) 20 MG tablet Take 20 mg by mouth 3 (three) times daily.  2  . butalbital-acetaminophen-caffeine (FIORICET, ESGIC) 50-325-40 MG per tablet Take 1 tablet by mouth 2 (two) times daily as needed for headache.    . canagliflozin (INVOKANA) 300 MG TABS tablet Take 1 tablet (  300 mg total) by mouth daily before breakfast. 30 tablet 11  . cholecalciferol (VITAMIN D) 1000 UNITS tablet Take 1,000 Units by mouth daily.    . fish oil-omega-3 fatty acids 1000 MG capsule Take 1 g by mouth 3 (three) times daily.     Marland Kitchen glucose blood (ACCU-CHEK GUIDE) test strip 1 each by Other route 2 (two) times daily. And lancets 2/day 100 each 12  . hydrocortisone cream 0.5 % Apply topically as needed.    . Insulin Degludec (TRESIBA FLEXTOUCH) 200 UNIT/ML SOPN Inject 46 Units into the skin daily. And pen needles 1/day 3 pen 11  . lacosamide (VIMPAT) 200 MG TABS Take by mouth 2 (two) times daily.      Marland Kitchen latanoprost (XALATAN) 0.005 % ophthalmic solution INSTILL 1 DROP(S) IN [facility] EYE DAILY IN THE EVENING    . methimazole (TAPAZOLE) 10 MG tablet Take 2 tablets (20 mg total) by mouth 2 (two) times daily. 60  tablet prn  . simvastatin (ZOCOR) 20 MG tablet Take 40 mg by mouth every evening.     . simvastatin (ZOCOR) 20 MG tablet Take 20 mg by mouth.    . sitaGLIPtin (JANUVIA) 100 MG tablet Take 1 tablet (100 mg total) by mouth daily. 30 tablet 11  . traMADol (ULTRAM) 50 MG tablet Take 1 tablet (50 mg total) by mouth every 6 (six) hours as needed. 15 tablet 0  . warfarin (COUMADIN) 10 MG tablet Take 10 mg by mouth daily. Wed, Thurs and Fri 10 mg and all other days 7.5 mg    . zonisamide (ZONEGRAN) 100 MG capsule Take 300 mg by mouth.     No current facility-administered medications on file prior to visit.     No Known Allergies  Family History  Problem Relation Age of Onset  . Cancer Father   . Diabetes Neg Hx     BP 130/78 (BP Location: Right Arm, Patient Position: Sitting, Cuff Size: Large)   Pulse 79   Ht 5\' 3"  (1.6 m)   SpO2 96%   BMI 34.54 kg/m    Review of Systems She denies fever    Objective:   Physical Exam VITAL SIGNS:  See vs page GENERAL: no distress Pulses: dorsalis pedis intact bilat.   MSK: no deformity of the feet CV: no leg edema Skin:  no ulcer on the feet.  normal color and temp on the feet. Neuro: sensation is intact to touch on the feet, but decreased from normal.     Lab Results  Component Value Date   HGBA1C 7.0 (A) 04/22/2019      Assessment & Plan:  Insulin-requiring type 2 DM: well-controlled.  Glipizide and insulin are redundant, so we'll taper off glipizide. Hyperthyroidism, due for recheck Edema: better, but this limits rx options   Patient Instructions  check your blood sugar twice a day.  vary the time of day when you check, between before the 3 meals, and at bedtime.  also check if you have symptoms of your blood sugar being too high or too low.  please keep a record of the readings and bring it to your next appointment here (or you can bring the meter itself).  You can write it on any piece of paper.  please call us sooner if your blood  sugar goes below 70, or if you have a lot of readings over 200.  Please reduce the glipizide to 2.5 mg each morning, and:  Please continue the same other diabetes medications, including  the insulin.  Thyroid blood tests are requested for you today.  We'll let you know about the results.  If ever you have fever while taking methimazole, stop it and call us, even if the reason is obvious, because of the risk of a rare side-effect.     Please come back for a follow-up appointment in 2 months.

## 2019-04-27 DIAGNOSIS — I6330 Cerebral infarction due to thrombosis of unspecified cerebral artery: Secondary | ICD-10-CM | POA: Diagnosis not present

## 2019-04-27 DIAGNOSIS — Z5181 Encounter for therapeutic drug level monitoring: Secondary | ICD-10-CM | POA: Diagnosis not present

## 2019-04-27 DIAGNOSIS — Z7901 Long term (current) use of anticoagulants: Secondary | ICD-10-CM | POA: Diagnosis not present

## 2019-05-01 ENCOUNTER — Other Ambulatory Visit: Payer: Self-pay

## 2019-05-01 ENCOUNTER — Ambulatory Visit: Payer: Medicare Other | Admitting: Orthotics

## 2019-05-01 DIAGNOSIS — L84 Corns and callosities: Secondary | ICD-10-CM

## 2019-05-01 DIAGNOSIS — E1165 Type 2 diabetes mellitus with hyperglycemia: Secondary | ICD-10-CM | POA: Diagnosis not present

## 2019-05-01 DIAGNOSIS — E1142 Type 2 diabetes mellitus with diabetic polyneuropathy: Secondary | ICD-10-CM

## 2019-05-01 DIAGNOSIS — E1342 Other specified diabetes mellitus with diabetic polyneuropathy: Secondary | ICD-10-CM | POA: Diagnosis not present

## 2019-05-05 DIAGNOSIS — Z5181 Encounter for therapeutic drug level monitoring: Secondary | ICD-10-CM | POA: Diagnosis not present

## 2019-05-05 DIAGNOSIS — R791 Abnormal coagulation profile: Secondary | ICD-10-CM | POA: Diagnosis not present

## 2019-05-05 DIAGNOSIS — I6330 Cerebral infarction due to thrombosis of unspecified cerebral artery: Secondary | ICD-10-CM | POA: Diagnosis not present

## 2019-05-05 DIAGNOSIS — Z7901 Long term (current) use of anticoagulants: Secondary | ICD-10-CM | POA: Diagnosis not present

## 2019-05-06 ENCOUNTER — Ambulatory Visit: Payer: Medicare Other | Admitting: Podiatry

## 2019-05-11 DIAGNOSIS — R569 Unspecified convulsions: Secondary | ICD-10-CM | POA: Diagnosis not present

## 2019-05-11 DIAGNOSIS — I10 Essential (primary) hypertension: Secondary | ICD-10-CM | POA: Diagnosis not present

## 2019-05-11 DIAGNOSIS — E782 Mixed hyperlipidemia: Secondary | ICD-10-CM | POA: Diagnosis not present

## 2019-05-11 DIAGNOSIS — I6789 Other cerebrovascular disease: Secondary | ICD-10-CM | POA: Diagnosis not present

## 2019-05-11 DIAGNOSIS — E1165 Type 2 diabetes mellitus with hyperglycemia: Secondary | ICD-10-CM | POA: Diagnosis not present

## 2019-05-11 DIAGNOSIS — Z23 Encounter for immunization: Secondary | ICD-10-CM | POA: Diagnosis not present

## 2019-05-13 DIAGNOSIS — Z5181 Encounter for therapeutic drug level monitoring: Secondary | ICD-10-CM | POA: Diagnosis not present

## 2019-05-13 DIAGNOSIS — Z7901 Long term (current) use of anticoagulants: Secondary | ICD-10-CM | POA: Diagnosis not present

## 2019-05-13 DIAGNOSIS — I6330 Cerebral infarction due to thrombosis of unspecified cerebral artery: Secondary | ICD-10-CM | POA: Diagnosis not present

## 2019-05-13 NOTE — Progress Notes (Signed)
Patient had appointment today for definitive and final diabetic shoe fitting and delivery.  Patient was seen by Benjie Karvonen, C.Ped, [facility].   The inserts fit well and accomplished full contact with the plantar surface of the foot bilateral; the shoes fit well and offered forefoot freedom, no noticible heel slippage, and good toe clearance w/ the insert in place.  Patient was advised to monitor of any skin irritation, breakdown.  Patient was satisfied with fit and function.  Dawn to put in charges

## 2019-05-18 ENCOUNTER — Other Ambulatory Visit: Payer: Self-pay | Admitting: Endocrinology

## 2019-05-25 DIAGNOSIS — Z1389 Encounter for screening for other disorder: Secondary | ICD-10-CM | POA: Diagnosis not present

## 2019-05-25 DIAGNOSIS — I10 Essential (primary) hypertension: Secondary | ICD-10-CM | POA: Diagnosis not present

## 2019-05-25 DIAGNOSIS — I6789 Other cerebrovascular disease: Secondary | ICD-10-CM | POA: Diagnosis not present

## 2019-05-25 DIAGNOSIS — E782 Mixed hyperlipidemia: Secondary | ICD-10-CM | POA: Diagnosis not present

## 2019-05-25 DIAGNOSIS — E1165 Type 2 diabetes mellitus with hyperglycemia: Secondary | ICD-10-CM | POA: Diagnosis not present

## 2019-05-25 DIAGNOSIS — Z0001 Encounter for general adult medical examination with abnormal findings: Secondary | ICD-10-CM | POA: Diagnosis not present

## 2019-05-28 DIAGNOSIS — Z7901 Long term (current) use of anticoagulants: Secondary | ICD-10-CM | POA: Diagnosis not present

## 2019-05-28 DIAGNOSIS — Z5181 Encounter for therapeutic drug level monitoring: Secondary | ICD-10-CM | POA: Diagnosis not present

## 2019-05-28 DIAGNOSIS — I6330 Cerebral infarction due to thrombosis of unspecified cerebral artery: Secondary | ICD-10-CM | POA: Diagnosis not present

## 2019-05-30 DIAGNOSIS — Z1231 Encounter for screening mammogram for malignant neoplasm of breast: Secondary | ICD-10-CM | POA: Diagnosis not present

## 2019-06-08 DIAGNOSIS — I6330 Cerebral infarction due to thrombosis of unspecified cerebral artery: Secondary | ICD-10-CM | POA: Diagnosis not present

## 2019-06-08 DIAGNOSIS — Z7901 Long term (current) use of anticoagulants: Secondary | ICD-10-CM | POA: Diagnosis not present

## 2019-06-08 DIAGNOSIS — Z5181 Encounter for therapeutic drug level monitoring: Secondary | ICD-10-CM | POA: Diagnosis not present

## 2019-06-10 ENCOUNTER — Ambulatory Visit: Payer: Medicare Other | Admitting: Podiatry

## 2019-06-10 DIAGNOSIS — R921 Mammographic calcification found on diagnostic imaging of breast: Secondary | ICD-10-CM | POA: Diagnosis not present

## 2019-06-10 DIAGNOSIS — R928 Other abnormal and inconclusive findings on diagnostic imaging of breast: Secondary | ICD-10-CM | POA: Diagnosis not present

## 2019-06-15 ENCOUNTER — Other Ambulatory Visit: Payer: Self-pay

## 2019-06-17 ENCOUNTER — Encounter: Payer: Self-pay | Admitting: Endocrinology

## 2019-06-17 ENCOUNTER — Ambulatory Visit (INDEPENDENT_AMBULATORY_CARE_PROVIDER_SITE_OTHER): Payer: Medicare Other | Admitting: Endocrinology

## 2019-06-17 ENCOUNTER — Other Ambulatory Visit: Payer: Self-pay

## 2019-06-17 VITALS — BP 100/60 | HR 86 | Ht 63.0 in | Wt 229.8 lb

## 2019-06-17 DIAGNOSIS — E1149 Type 2 diabetes mellitus with other diabetic neurological complication: Secondary | ICD-10-CM

## 2019-06-17 DIAGNOSIS — I6789 Other cerebrovascular disease: Secondary | ICD-10-CM | POA: Diagnosis not present

## 2019-06-17 DIAGNOSIS — I10 Essential (primary) hypertension: Secondary | ICD-10-CM | POA: Diagnosis not present

## 2019-06-17 DIAGNOSIS — Z794 Long term (current) use of insulin: Secondary | ICD-10-CM

## 2019-06-17 DIAGNOSIS — E1165 Type 2 diabetes mellitus with hyperglycemia: Secondary | ICD-10-CM | POA: Diagnosis not present

## 2019-06-17 DIAGNOSIS — E0590 Thyrotoxicosis, unspecified without thyrotoxic crisis or storm: Secondary | ICD-10-CM

## 2019-06-17 DIAGNOSIS — Z0001 Encounter for general adult medical examination with abnormal findings: Secondary | ICD-10-CM | POA: Diagnosis not present

## 2019-06-17 DIAGNOSIS — E782 Mixed hyperlipidemia: Secondary | ICD-10-CM | POA: Diagnosis not present

## 2019-06-17 LAB — POCT GLYCOSYLATED HEMOGLOBIN (HGB A1C): Hemoglobin A1C: 6.8 % — AB (ref 4.0–5.6)

## 2019-06-17 LAB — T4, FREE: Free T4: 0.27 ng/dL — ABNORMAL LOW (ref 0.60–1.60)

## 2019-06-17 LAB — TSH: TSH: 30.39 u[IU]/mL — ABNORMAL HIGH (ref 0.35–4.50)

## 2019-06-17 MED ORDER — METHIMAZOLE 10 MG PO TABS: 10.0000 mg | ORAL_TABLET | Freq: Every day | ORAL | 2 refills | Status: DC

## 2019-06-17 NOTE — Patient Instructions (Addendum)
check your blood sugar twice a day.  vary the time of day when you check, between before the 3 meals, and at bedtime.  also check if you have symptoms of your blood sugar being too high or too low.  please keep a record of the readings and bring it to your next appointment here (or you can bring the meter itself).  You can write it on any piece of paper.  please call us sooner if your blood sugar goes below 70, or if you have a lot of readings over 200.  Please stop taking the glipizide, and: Please continue the same other diabetes medications, including the insulin.   If ever you have fever while taking methimazole, stop it and call us, even if the reason is obvious, because of the risk of a rare side-effect.  Blood tests are requested for you today.  We'll let you know about the results.  Please come back for a follow-up appointment in 2 months.

## 2019-06-17 NOTE — Progress Notes (Signed)
Subjective:    Patient ID: Kimberly [name], female    DOB: 12-06-1960, 58 y.o.   MRN: [id number]  HPI Pt returns for f/u of DM: DM type: Insulin-requiring type 2.   Dx'ed: [id number] Complications: CVA Therapy: 3 oral meds, and insulin since 2019.   GDM: never DKA: never Severe hypoglycemia: never.  Pancreatitis: never Pancreatic imaging: never Other: she also took insulin 2011-2017; she declines multiple daily injections; she stopped lantus due to n/v; she tolerates tresiba well;  she wants to reduce insulin and/or increase non-insulin rx if possible; edema limits rx options; she did not tolerate metformin (nausea).    Interval history: she brings a record of her cbg's which I have reviewed today.  cbg varies from 81-208.  There is no trend throughout the day.   Pt also has hyperthyroidism (dx'ed 2020; pt says she also had hyperthyroidism in 2010; it resolved; she was rx'ed tapazole in 2020 for recurrence; she has never had dedicated thyroid imaging; she chose tapazole rx).  Past Medical History:  Diagnosis Date  . Diabetes mellitus   . Hypertension   . Seizures ([place])    last seizure march 2016  . Stroke [facility]     Past Surgical History:  Procedure Laterality Date  . TRACHEOSTOMY CLOSURE      Social History   Socioeconomic History  . Marital status: Married    Spouse name: Not on file  . Number of children: Not on file  . Years of education: Not on file  . Highest education level: Not on file  Occupational History  . Not on file  Social Needs  . Financial resource strain: Not on file  . Food insecurity    Worry: Not on file    Inability: Not on file  . Transportation needs    Medical: Not on file    Non-medical: Not on file  Tobacco Use  . Smoking status: Never Smoker  . Smokeless tobacco: Never Used  Substance and Sexual Activity  . Alcohol use: No  . Drug use: No  . Sexual activity: Not on file  Lifestyle  . Physical activity    Days per week: Not on file   Minutes per session: Not on file  . Stress: Not on file  Relationships  . Social Herbalist on phone: Not on file    Gets together: Not on file    Attends religious service: Not on file    Active member of club or organization: Not on file    Attends meetings of clubs or organizations: Not on file    Relationship status: Not on file  . Intimate partner violence    Fear of current or ex partner: Not on file    Emotionally abused: Not on file    Physically abused: Not on file    Forced sexual activity: Not on file  Other Topics Concern  . Not on file  Social History Narrative  . Not on file    Current Outpatient Medications on File Prior to Visit  Medication Sig Dispense Refill  . Accu-Chek Softclix Lancets lancets Use to monitor glucose levels once daily; E11.9 100 each 3  . baclofen (LIORESAL) 20 MG tablet Take 20 mg by mouth 3 (three) times daily.  2  . butalbital-acetaminophen-caffeine (FIORICET, ESGIC) 50-325-40 MG per tablet Take 1 tablet by mouth 2 (two) times daily as needed for headache.    . canagliflozin (INVOKANA) 300 MG TABS tablet Take 1 tablet (300 mg  total) by mouth daily before breakfast. 30 tablet 11  . cholecalciferol (VITAMIN D) 1000 UNITS tablet Take 1,000 Units by mouth daily.    . fish oil-omega-3 fatty acids 1000 MG capsule Take 1 g by mouth 3 (three) times daily.     Marland Kitchen glucose blood (ACCU-CHEK GUIDE) test strip 1 each by Other route 2 (two) times daily. And lancets 2/day 100 each 12  . hydrocortisone cream 0.5 % Apply topically as needed.    . Insulin Degludec (TRESIBA FLEXTOUCH) 200 UNIT/ML SOPN Inject 46 Units into the skin daily. And pen needles 1/day 3 pen 11  . JANUVIA 100 MG tablet TAKE 1 TABLET BY MOUTH EVERY DAY 90 tablet 3  . lacosamide (VIMPAT) 200 MG TABS Take by mouth 2 (two) times daily.      Marland Kitchen latanoprost (XALATAN) 0.005 % ophthalmic solution INSTILL 1 DROP(S) IN EACH EYE DAILY IN THE EVENING    . simvastatin (ZOCOR) 20 MG tablet Take  40 mg by mouth every evening.     . simvastatin (ZOCOR) 20 MG tablet Take 20 mg by mouth.    . traMADol (ULTRAM) 50 MG tablet Take 1 tablet (50 mg total) by mouth every 6 (six) hours as needed. 15 tablet 0  . warfarin (COUMADIN) 10 MG tablet Take 10 mg by mouth daily. Wed, Thurs and Fri 10 mg and all other days 7.5 mg    . zonisamide (ZONEGRAN) 100 MG capsule Take 300 mg by mouth.     No current facility-administered medications on file prior to visit.     No Known Allergies  Family History  Problem Relation Age of Onset  . Cancer Father   . Diabetes Neg Hx     BP 100/60 (BP Location: Right Arm, Patient Position: Sitting, Cuff Size: Large)   Pulse 86   Ht 5\' 3"  (1.6 m)   Wt 229 lb 12.8 oz (104.2 kg)   SpO2 98%   BMI 40.71 kg/m    Review of Systems Denies fever.      Objective:   Physical Exam VITAL SIGNS:  See vs page GENERAL: no distress. In wheelchair.   Pulses: dorsalis pedis intact bilat.   MSK: no deformity of the feet CV: trace bilat leg edema Skin:  no ulcer on the feet.  normal color and temp on the feet. Neuro: sensation is intact to touch on the feet  Lab Results  Component Value Date   CREATININE 0.57 03/11/2019   BUN 14 03/11/2019   NA 139 03/11/2019   K 4.2 03/11/2019   CL 104 03/11/2019   CO2 27 03/11/2019   Lab Results  Component Value Date   HGBA1C 7.0 (A) 04/22/2019   Lab Results  Component Value Date   TSH 30.39 (H) 06/17/2019       Assessment & Plan:  Hyperthyroidism, overcontrolled.  Reduce tapazole Insulin-requiring type 2 DM, with CVA: overcontrolled, for this SU-containing regimen.  Edema: This limits rx options  Patient Instructions  check your blood sugar twice a day.  vary the time of day when you check, between before the 3 meals, and at bedtime.  also check if you have symptoms of your blood sugar being too high or too low.  please keep a record of the readings and bring it to your next appointment here (or you can bring the  meter itself).  You can write it on any piece of paper.  please call us sooner if your blood sugar goes below 70, or if  you have a lot of readings over 200.  Please stop taking the glipizide, and: Please continue the same other diabetes medications, including the insulin.   If ever you have fever while taking methimazole, stop it and call us, even if the reason is obvious, because of the risk of a rare side-effect.  Blood tests are requested for you today.  We'll let you know about the results.  Please come back for a follow-up appointment in 2 months.

## 2019-06-18 NOTE — Telephone Encounter (Signed)
Please advise 

## 2019-06-23 ENCOUNTER — Encounter: Payer: Self-pay | Admitting: Endocrinology

## 2019-06-23 DIAGNOSIS — Z7901 Long term (current) use of anticoagulants: Secondary | ICD-10-CM | POA: Diagnosis not present

## 2019-06-23 DIAGNOSIS — I6330 Cerebral infarction due to thrombosis of unspecified cerebral artery: Secondary | ICD-10-CM | POA: Diagnosis not present

## 2019-06-23 DIAGNOSIS — R791 Abnormal coagulation profile: Secondary | ICD-10-CM | POA: Diagnosis not present

## 2019-06-23 DIAGNOSIS — Z5181 Encounter for therapeutic drug level monitoring: Secondary | ICD-10-CM | POA: Diagnosis not present

## 2019-06-23 NOTE — Telephone Encounter (Signed)
Please advise 

## 2019-06-24 ENCOUNTER — Encounter: Payer: Self-pay | Admitting: Podiatry

## 2019-06-24 ENCOUNTER — Encounter: Payer: Self-pay | Admitting: Endocrinology

## 2019-06-24 ENCOUNTER — Ambulatory Visit (INDEPENDENT_AMBULATORY_CARE_PROVIDER_SITE_OTHER): Payer: Medicare Other | Admitting: Podiatry

## 2019-06-24 ENCOUNTER — Other Ambulatory Visit: Payer: Self-pay

## 2019-06-24 DIAGNOSIS — B351 Tinea unguium: Secondary | ICD-10-CM

## 2019-06-24 DIAGNOSIS — E1142 Type 2 diabetes mellitus with diabetic polyneuropathy: Secondary | ICD-10-CM

## 2019-06-24 NOTE — Telephone Encounter (Signed)
Please advise 

## 2019-06-24 NOTE — Patient Instructions (Signed)
Diabetes Mellitus and Foot Care Foot care is an important part of your health, especially when you have diabetes. Diabetes may cause you to have problems because of poor blood flow (circulation) to your feet and legs, which can cause your skin to:  Become thinner and drier.  Break more easily.  Heal more slowly.  Peel and crack. You may also have nerve damage (neuropathy) in your legs and feet, causing decreased feeling in them. This means that you may not notice minor injuries to your feet that could lead to more serious problems. Noticing and addressing any potential problems early is the best way to prevent future foot problems. How to care for your feet Foot hygiene  Wash your feet daily with warm water and mild soap. Do not use hot water. Then, pat your feet and the areas between your toes until they are completely dry. Do not soak your feet as this can dry your skin.  Trim your toenails straight across. Do not dig under them or around the cuticle. File the edges of your nails with an emery board or nail file.  Apply a moisturizing lotion or petroleum jelly to the skin on your feet and to dry, brittle toenails. Use lotion that does not contain alcohol and is unscented. Do not apply lotion between your toes. Shoes and socks  Wear clean socks or stockings every day. Make sure they are not too tight. Do not wear knee-high stockings since they may decrease blood flow to your legs.  Wear shoes that fit properly and have enough cushioning. Always look in your shoes before you put them on to be sure there are no objects inside.  To break in new shoes, wear them for just a few hours a day. This prevents injuries on your feet. Wounds, scrapes, corns, and calluses  Check your feet daily for blisters, cuts, bruises, sores, and redness. If you cannot see the bottom of your feet, use a mirror or ask someone for help.  Do not cut corns or calluses or try to remove them with medicine.  If you  find a minor scrape, cut, or break in the skin on your feet, keep it and the skin around it clean and dry. You may clean these areas with mild soap and water. Do not clean the area with peroxide, alcohol, or iodine.  If you have a wound, scrape, corn, or callus on your foot, look at it several times a day to make sure it is healing and not infected. Check for: ? Redness, swelling, or pain. ? Fluid or blood. ? Warmth. ? Pus or a bad smell. General instructions  Do not cross your legs. This may decrease blood flow to your feet.  Do not use heating pads or hot water bottles on your feet. They may burn your skin. If you have lost feeling in your feet or legs, you may not know this is happening until it is too late.  Protect your feet from hot and cold by wearing shoes, such as at the beach or on hot pavement.  Schedule a complete foot exam at least once a year (annually) or more often if you have foot problems. If you have foot problems, report any cuts, sores, or bruises to your health care provider immediately. Contact a health care provider if:  You have a medical condition that increases your risk of infection and you have any cuts, sores, or bruises on your feet.  You have an injury that is not   healing.  You have redness on your legs or feet.  You feel burning or tingling in your legs or feet.  You have pain or cramps in your legs and feet.  Your legs or feet are numb.  Your feet always feel cold.  You have pain around a toenail. Get help right away if:  You have a wound, scrape, corn, or callus on your foot and: ? You have pain, swelling, or redness that gets worse. ? You have fluid or blood coming from the wound, scrape, corn, or callus. ? Your wound, scrape, corn, or callus feels warm to the touch. ? You have pus or a bad smell coming from the wound, scrape, corn, or callus. ? You have a fever. ? You have a red line going up your leg. Summary  Check your feet every day  for cuts, sores, red spots, swelling, and blisters.  Moisturize feet and legs daily.  Wear shoes that fit properly and have enough cushioning.  If you have foot problems, report any cuts, sores, or bruises to your health care provider immediately.  Schedule a complete foot exam at least once a year (annually) or more often if you have foot problems. This information is not intended to replace advice given to you by your health care provider. Make sure you discuss any questions you have with your health care provider. Document Released: 08/10/2000 Document Revised: 09/25/2017 Document Reviewed: 09/14/2016 Elsevier Patient Education  2020 Elsevier Inc.  

## 2019-06-26 DIAGNOSIS — Z5181 Encounter for therapeutic drug level monitoring: Secondary | ICD-10-CM | POA: Diagnosis not present

## 2019-06-26 DIAGNOSIS — Z7901 Long term (current) use of anticoagulants: Secondary | ICD-10-CM | POA: Diagnosis not present

## 2019-06-26 DIAGNOSIS — I6330 Cerebral infarction due to thrombosis of unspecified cerebral artery: Secondary | ICD-10-CM | POA: Diagnosis not present

## 2019-06-27 NOTE — Progress Notes (Signed)
Subjective: Kimberly [name] is seen today for follow up painful, elongated, thickened toenails 1-5 b/l feet that she cannot cut. Pain interferes with daily activities. Aggravating factor includes wearing enclosed shoe gear and relieved with periodic debridement.+  Her husband and caregiver are present during the visit. She remains on blood thinner, Coumadin. She voices no new pedal concerns on today's visit.  Current Outpatient Medications on File Prior to Visit  Medication Sig  . Accu-Chek Softclix Lancets lancets Use to monitor glucose levels once daily; E11.9  . baclofen (LIORESAL) 20 MG tablet Take 20 mg by mouth 3 (three) times daily.  . butalbital-acetaminophen-caffeine (FIORICET, ESGIC) 50-325-40 MG per tablet Take 1 tablet by mouth 2 (two) times daily as needed for headache.  . canagliflozin (INVOKANA) 300 MG TABS tablet Take 1 tablet (300 mg total) by mouth daily before breakfast.  . cholecalciferol (VITAMIN D) 1000 UNITS tablet Take 1,000 Units by mouth daily.  . fish oil-omega-3 fatty acids 1000 MG capsule Take 1 g by mouth 3 (three) times daily.   Marland Kitchen glucose blood (ACCU-CHEK GUIDE) test strip 1 each by Other route 2 (two) times daily. And lancets 2/day  . hydrocortisone cream 0.5 % Apply topically as needed.  . Insulin Degludec (TRESIBA FLEXTOUCH) 200 UNIT/ML SOPN Inject 46 Units into the skin daily. And pen needles 1/day  . JANUVIA 100 MG tablet TAKE 1 TABLET BY MOUTH EVERY DAY  . lacosamide (VIMPAT) 200 MG TABS Take by mouth 2 (two) times daily.    Marland Kitchen latanoprost (XALATAN) 0.005 % ophthalmic solution INSTILL 1 DROP(S) IN [facility] EYE DAILY IN THE EVENING  . methimazole (TAPAZOLE) 10 MG tablet Take 1 tablet (10 mg total) by mouth daily.  . simvastatin (ZOCOR) 20 MG tablet Take 40 mg by mouth every evening.   . simvastatin (ZOCOR) 20 MG tablet Take 20 mg by mouth.  . traMADol (ULTRAM) 50 MG tablet Take 1 tablet (50 mg total) by mouth every 6 (six) hours as needed.  . warfarin (COUMADIN) 10  MG tablet Take 10 mg by mouth daily. Wed, Thurs and Fri 10 mg and all other days 7.5 mg  . zonisamide (ZONEGRAN) 100 MG capsule Take 300 mg by mouth.   No current facility-administered medications on file prior to visit.      No Known Allergies   Objective:  Vascular Examination: Capillary refill time immediate x 10 digits.  Dorsalis pedis present b/l.  Posterior tibial pulses present b/l.  Digital hair absent b/l.  Skin temperature gradient WNL b/l.   Dermatological Examination: Skin with normal turgor, texture and tone b/l.  Toenails 1-5 b/l discolored, thick, dystrophic with subungual debris and pain with palpation to nailbeds due to thickness of nails.  Callus resolved submet head 5 left foot.   Musculoskeletal: Flaccid LLE with left sided hemiplegia.   No gross bony deformities b/l.  No pain, crepitus or joint limitation noted with ROM.   Neurological Examination: Protective sensation decreased b/l.  Vibratory sensation diminished b/l.  Assessment: Painful onychomycosis toenails 1-5 b/l  Resolved callus left foot Spastic hemiplegia LLE secondary to CVA NIDDM  Plan: 1. Toenails 1-5 b/l were debrided in length and girth without iatrogenic bleeding. Avoid self trimming due to blood thinner use. 2. Continue AFO daily.  3. Patient to report any pedal injuries to medical professional immediately. 4. Follow up 3 months.  5. Patient/POA to call should there be a concern in the interim.

## 2019-07-01 DIAGNOSIS — Z7901 Long term (current) use of anticoagulants: Secondary | ICD-10-CM | POA: Diagnosis not present

## 2019-07-01 DIAGNOSIS — I6330 Cerebral infarction due to thrombosis of unspecified cerebral artery: Secondary | ICD-10-CM | POA: Diagnosis not present

## 2019-07-01 DIAGNOSIS — Z5181 Encounter for therapeutic drug level monitoring: Secondary | ICD-10-CM | POA: Diagnosis not present

## 2019-07-14 DIAGNOSIS — R791 Abnormal coagulation profile: Secondary | ICD-10-CM | POA: Diagnosis not present

## 2019-07-14 DIAGNOSIS — Z5181 Encounter for therapeutic drug level monitoring: Secondary | ICD-10-CM | POA: Diagnosis not present

## 2019-07-14 DIAGNOSIS — Z7901 Long term (current) use of anticoagulants: Secondary | ICD-10-CM | POA: Diagnosis not present

## 2019-07-14 DIAGNOSIS — I6330 Cerebral infarction due to thrombosis of unspecified cerebral artery: Secondary | ICD-10-CM | POA: Diagnosis not present

## 2019-07-19 ENCOUNTER — Other Ambulatory Visit: Payer: Self-pay | Admitting: Endocrinology

## 2019-07-19 DIAGNOSIS — E119 Type 2 diabetes mellitus without complications: Secondary | ICD-10-CM

## 2019-07-28 DIAGNOSIS — Z7901 Long term (current) use of anticoagulants: Secondary | ICD-10-CM | POA: Diagnosis not present

## 2019-07-28 DIAGNOSIS — I6330 Cerebral infarction due to thrombosis of unspecified cerebral artery: Secondary | ICD-10-CM | POA: Diagnosis not present

## 2019-07-28 DIAGNOSIS — Z5181 Encounter for therapeutic drug level monitoring: Secondary | ICD-10-CM | POA: Diagnosis not present

## 2019-08-10 ENCOUNTER — Other Ambulatory Visit: Payer: Self-pay

## 2019-08-12 ENCOUNTER — Ambulatory Visit: Payer: Medicare Other | Admitting: Endocrinology

## 2019-08-13 ENCOUNTER — Ambulatory Visit: Payer: Medicare Other | Admitting: Endocrinology

## 2019-08-15 ENCOUNTER — Other Ambulatory Visit: Payer: Self-pay | Admitting: Endocrinology

## 2019-08-17 ENCOUNTER — Other Ambulatory Visit: Payer: Self-pay | Admitting: Endocrinology

## 2019-08-18 DIAGNOSIS — Z5181 Encounter for therapeutic drug level monitoring: Secondary | ICD-10-CM | POA: Diagnosis not present

## 2019-08-18 DIAGNOSIS — I6330 Cerebral infarction due to thrombosis of unspecified cerebral artery: Secondary | ICD-10-CM | POA: Diagnosis not present

## 2019-08-18 DIAGNOSIS — Z7901 Long term (current) use of anticoagulants: Secondary | ICD-10-CM | POA: Diagnosis not present

## 2019-09-02 DIAGNOSIS — I6990 Unspecified sequelae of unspecified cerebrovascular disease: Secondary | ICD-10-CM | POA: Diagnosis not present

## 2019-09-13 ENCOUNTER — Other Ambulatory Visit: Payer: Self-pay | Admitting: Endocrinology

## 2019-09-15 DIAGNOSIS — Z5181 Encounter for therapeutic drug level monitoring: Secondary | ICD-10-CM | POA: Diagnosis not present

## 2019-09-15 DIAGNOSIS — Z7901 Long term (current) use of anticoagulants: Secondary | ICD-10-CM | POA: Diagnosis not present

## 2019-09-15 DIAGNOSIS — I6330 Cerebral infarction due to thrombosis of unspecified cerebral artery: Secondary | ICD-10-CM | POA: Diagnosis not present

## 2019-09-17 ENCOUNTER — Other Ambulatory Visit: Payer: Self-pay

## 2019-09-21 ENCOUNTER — Ambulatory Visit (INDEPENDENT_AMBULATORY_CARE_PROVIDER_SITE_OTHER): Payer: Medicare Other | Admitting: Endocrinology

## 2019-09-21 ENCOUNTER — Other Ambulatory Visit: Payer: Self-pay

## 2019-09-21 VITALS — BP 112/78 | HR 81 | Ht 63.0 in | Wt 247.6 lb

## 2019-09-21 DIAGNOSIS — E1149 Type 2 diabetes mellitus with other diabetic neurological complication: Secondary | ICD-10-CM | POA: Diagnosis not present

## 2019-09-21 DIAGNOSIS — Z794 Long term (current) use of insulin: Secondary | ICD-10-CM

## 2019-09-21 LAB — POCT GLYCOSYLATED HEMOGLOBIN (HGB A1C): Hemoglobin A1C: 7.5 % — AB (ref 4.0–5.6)

## 2019-09-21 NOTE — Progress Notes (Signed)
Subjective:    Patient ID: Kimberly [name], female    DOB: 02/19/1961, 58 y.o.   MRN: [id number]  HPI Pt returns for f/u of DM: DM type: Insulin-requiring type 2.   Dx'ed: [id number] Complications: CVA Therapy: 2 oral meds, and insulin since 2019.   GDM: never DKA: never Severe hypoglycemia: never.  Pancreatitis: never Pancreatic imaging: never Other: she also took insulin 2011-2017; she declines multiple daily injections; she stopped lantus due to n/v; she tolerates tresiba well; edema limits rx options; she did not tolerate metformin (nausea).    Interval history: she brings a record of her cbg's which I have reviewed today.  cbg varies from 78-125.  There is no trend throughout the day.   Pt also has hyperthyroidism (dx'ed 2020; pt says she also had hyperthyroidism in 2010; it resolved; she was rx'ed tapazole in 2020 for recurrence; she has never had dedicated thyroid imaging; she chose tapazole rx). She reports weight gain. She says tapazole is still 10 mg twice a day (she did not reduce as rx'ed last time)   Past Medical History:  Diagnosis Date  . Diabetes mellitus   . Hypertension   . Seizures ([place])    last seizure march 2016  . Stroke [facility])     Past Surgical History:  Procedure Laterality Date  . TRACHEOSTOMY CLOSURE      Social History   Socioeconomic History  . Marital status: Married    Spouse name: Not on file  . Number of children: Not on file  . Years of education: Not on file  . Highest education level: Not on file  Occupational History  . Not on file  Tobacco Use  . Smoking status: Never Smoker  . Smokeless tobacco: Never Used  Substance and Sexual Activity  . Alcohol use: No  . Drug use: No  . Sexual activity: Not on file  Other Topics Concern  . Not on file  Social History Narrative  . Not on file   Social Determinants of Health   Financial Resource Strain:   . Difficulty of Paying Living Expenses: Not on file  Food Insecurity:   . Worried About  Charity fundraiser in the Last Year: Not on file  . Ran Out of Food in the Last Year: Not on file  Transportation Needs:   . Lack of Transportation (Medical): Not on file  . Lack of Transportation (Non-Medical): Not on file  Physical Activity:   . Days of Exercise per Week: Not on file  . Minutes of Exercise per Session: Not on file  Stress:   . Feeling of Stress : Not on file  Social Connections:   . Frequency of Communication with Friends and Family: Not on file  . Frequency of Social Gatherings with Friends and Family: Not on file  . Attends Religious Services: Not on file  . Active Member of Clubs or Organizations: Not on file  . Attends Archivist Meetings: Not on file  . Marital Status: Not on file  Intimate Partner Violence:   . Fear of Current or Ex-Partner: Not on file  . Emotionally Abused: Not on file  . Physically Abused: Not on file  . Sexually Abused: Not on file    Current Outpatient Medications on File Prior to Visit  Medication Sig Dispense Refill  . Accu-Chek Softclix Lancets lancets Use to monitor glucose levels once daily; E11.9 100 each 3  . baclofen (LIORESAL) 20 MG tablet Take 20 mg by mouth  3 (three) times daily.  2  . butalbital-acetaminophen-caffeine (FIORICET, ESGIC) 50-325-40 MG per tablet Take 1 tablet by mouth 2 (two) times daily as needed for headache.    . canagliflozin (INVOKANA) 300 MG TABS tablet Take 1 tablet (300 mg total) by mouth daily before breakfast. 30 tablet 11  . cholecalciferol (VITAMIN D) 1000 UNITS tablet Take 1,000 Units by mouth daily.    . fish oil-omega-3 fatty acids 1000 MG capsule Take 1 g by mouth 3 (three) times daily.     Marland Kitchen glucose blood (ACCU-CHEK GUIDE) test strip 1 each by Other route 2 (two) times daily. And lancets 2/day 100 each 12  . hydrocortisone cream 0.5 % Apply topically as needed.    . Insulin Degludec (TRESIBA FLEXTOUCH) 200 UNIT/ML SOPN Inject 46 Units into the skin daily. And pen needles 1/day 3 pen  11  . Insulin Pen Needle (B-D UF III MINI PEN NEEDLES) 31G X 5 MM MISC 1 each by Other route daily. E11.9 100 each 3  . JANUVIA 100 MG tablet TAKE 1 TABLET BY MOUTH EVERY DAY 90 tablet 3  . lacosamide (VIMPAT) 200 MG TABS Take by mouth 2 (two) times daily.      Marland Kitchen latanoprost (XALATAN) 0.005 % ophthalmic solution INSTILL 1 DROP(S) IN [facility] DAILY IN THE EVENING    . methimazole (TAPAZOLE) 10 MG tablet Take 1 tablet (10 mg total) by mouth daily. 30 tablet 2  . simvastatin (ZOCOR) 20 MG tablet Take 40 mg by mouth every evening.     . simvastatin (ZOCOR) 20 MG tablet Take 20 mg by mouth.    . traMADol (ULTRAM) 50 MG tablet Take 1 tablet (50 mg total) by mouth every 6 (six) hours as needed. 15 tablet 0  . warfarin (COUMADIN) 10 MG tablet Take 10 mg by mouth daily. Wed, Thurs and Fri 10 mg and all other days 7.5 mg    . zonisamide (ZONEGRAN) 100 MG capsule Take 300 mg by mouth.     No current facility-administered medications on file prior to visit.    No Known Allergies  Family History  Problem Relation Age of Onset  . Cancer Father   . Diabetes Neg Hx     BP 112/78 (BP Location: Right Arm, Patient Position: Sitting, Cuff Size: Large)   Pulse 81   Ht 5\' 3"  (1.6 m)   Wt 247 lb 9.6 oz (112.3 kg)   SpO2 99%   BMI 43.86 kg/m    Review of Systems She denies hypoglycemia.  Denies fever.      Objective:   Physical Exam VITAL SIGNS:  See vs page GENERAL: no distress.  In wheelchair Pulses: dorsalis pedis intact bilat.   MSK: no deformity of the feet CV: trace bilat leg edema Skin:  no ulcer on the feet.  normal color and temp on the feet. Neuro: sensation is intact to touch on the feet.    Lab Results  Component Value Date   HGBA1C 7.5 (A) 09/21/2019   Lab Results  Component Value Date   CREATININE 0.57 03/11/2019   BUN 14 03/11/2019   NA 139 03/11/2019   K 4.2 03/11/2019   CL 104 03/11/2019   CO2 27 03/11/2019      Assessment & Plan:  Hyperthyroidism, due for  recheck Insulin-requiring type 2 DM: this is the best control this pt should aim for, given this regimen, which does match insulin to her changing needs throughout the day Noncompliance with methimazole dosage change:  we need to minimize med changes when possible.    Patient Instructions  check your blood sugar twice a day.  vary the time of day when you check, between before the 3 meals, and at bedtime.  also check if you have symptoms of your blood sugar being too high or too low.  please keep a record of the readings and bring it to your next appointment here (or you can bring the meter itself).  You can write it on any piece of paper.  please call us sooner if your blood sugar goes below 70, or if you have a lot of readings over 200.  Please continue the same diabetes medications, including the insulin.   If ever you have fever while taking methimazole, stop it and call us, even if the reason is obvious, because of the risk of a rare side-effect.   Blood tests are requested for you today.  We'll let you know about the results.   Based on the results, we probably need to reduce the methimazole.   Please come back for a follow-up appointment in 2 months.

## 2019-09-21 NOTE — Patient Instructions (Addendum)
check your blood sugar twice a day.  vary the time of day when you check, between before the 3 meals, and at bedtime.  also check if you have symptoms of your blood sugar being too high or too low.  please keep a record of the readings and bring it to your next appointment here (or you can bring the meter itself).  You can write it on any piece of paper.  please call us sooner if your blood sugar goes below 70, or if you have a lot of readings over 200.  Please continue the same diabetes medications, including the insulin.   If ever you have fever while taking methimazole, stop it and call us, even if the reason is obvious, because of the risk of a rare side-effect.   Blood tests are requested for you today.  We'll let you know about the results.   Based on the results, we probably need to reduce the methimazole.   Please come back for a follow-up appointment in 2 months.

## 2019-09-22 LAB — TSH: TSH: 58.8 u[IU]/mL — ABNORMAL HIGH (ref 0.35–4.50)

## 2019-09-22 LAB — T4, FREE: Free T4: 0.18 ng/dL — ABNORMAL LOW (ref 0.60–1.60)

## 2019-09-25 DIAGNOSIS — H2513 Age-related nuclear cataract, bilateral: Secondary | ICD-10-CM | POA: Diagnosis not present

## 2019-09-25 DIAGNOSIS — H401121 Primary open-angle glaucoma, left eye, mild stage: Secondary | ICD-10-CM | POA: Diagnosis not present

## 2019-09-25 DIAGNOSIS — H53431 Sector or arcuate defects, right eye: Secondary | ICD-10-CM | POA: Diagnosis not present

## 2019-09-25 DIAGNOSIS — H401112 Primary open-angle glaucoma, right eye, moderate stage: Secondary | ICD-10-CM | POA: Diagnosis not present

## 2019-09-29 DIAGNOSIS — Z5181 Encounter for therapeutic drug level monitoring: Secondary | ICD-10-CM | POA: Diagnosis not present

## 2019-09-29 DIAGNOSIS — Z7901 Long term (current) use of anticoagulants: Secondary | ICD-10-CM | POA: Diagnosis not present

## 2019-09-29 DIAGNOSIS — I6330 Cerebral infarction due to thrombosis of unspecified cerebral artery: Secondary | ICD-10-CM | POA: Diagnosis not present

## 2019-09-29 DIAGNOSIS — R791 Abnormal coagulation profile: Secondary | ICD-10-CM | POA: Diagnosis not present

## 2019-09-30 ENCOUNTER — Ambulatory Visit: Payer: Medicare Other | Admitting: Podiatry

## 2019-10-11 ENCOUNTER — Other Ambulatory Visit: Payer: Self-pay | Admitting: Endocrinology

## 2019-10-21 ENCOUNTER — Other Ambulatory Visit: Payer: Self-pay

## 2019-10-21 DIAGNOSIS — Z794 Long term (current) use of insulin: Secondary | ICD-10-CM

## 2019-10-21 DIAGNOSIS — E1149 Type 2 diabetes mellitus with other diabetic neurological complication: Secondary | ICD-10-CM

## 2019-10-21 MED ORDER — CANAGLIFLOZIN 300 MG PO TABS: 300.0000 mg | ORAL_TABLET | Freq: Every day | ORAL | 0 refills | Status: DC

## 2019-10-22 DIAGNOSIS — Z5181 Encounter for therapeutic drug level monitoring: Secondary | ICD-10-CM | POA: Diagnosis not present

## 2019-10-22 DIAGNOSIS — Z7901 Long term (current) use of anticoagulants: Secondary | ICD-10-CM | POA: Diagnosis not present

## 2019-10-22 DIAGNOSIS — I6330 Cerebral infarction due to thrombosis of unspecified cerebral artery: Secondary | ICD-10-CM | POA: Diagnosis not present

## 2019-11-02 ENCOUNTER — Telehealth: Payer: Self-pay

## 2019-11-02 ENCOUNTER — Other Ambulatory Visit: Payer: Self-pay

## 2019-11-02 ENCOUNTER — Other Ambulatory Visit: Payer: Self-pay | Admitting: Endocrinology

## 2019-11-02 DIAGNOSIS — E1149 Type 2 diabetes mellitus with other diabetic neurological complication: Secondary | ICD-10-CM

## 2019-11-02 DIAGNOSIS — Z794 Long term (current) use of insulin: Secondary | ICD-10-CM

## 2019-11-02 MED ORDER — CANAGLIFLOZIN 300 MG PO TABS: 300.0000 mg | ORAL_TABLET | Freq: Every day | ORAL | 0 refills | Status: DC

## 2019-11-02 MED ORDER — FARXIGA 10 MG PO TABS: 10.0000 mg | ORAL_TABLET | Freq: Every day | ORAL | 11 refills | Status: DC

## 2019-11-02 NOTE — Telephone Encounter (Signed)
Darrel calling again from [facility] stating a request was sent from [facility] for the PA on refill of Invokana and they have not received a response from our office-Darrel is requesting patient be called about this matter to make her a ware of how we will proceed

## 2019-11-02 NOTE — Telephone Encounter (Signed)
DENIAL  Medication: Invokana 300mg  Insurance Company: [employer] PA response: DENIED Rationale: Must have tried and failed BOTH [place] and [place].  Document has been given to Dr. Loanne Drilling for him to review and address. Will await his response.

## 2019-11-02 NOTE — Telephone Encounter (Signed)
MEDICATION: invokana  [facility]ACY[id number] [place], [place] - [address]   IS THIS A 90 DAY SUPPLY : yes  IS PATIENT OUT OF MEDICATION: yes  IF NOT; HOW MUCH IS LEFT:   LAST APPOINTMENT DATE: @2 /14/2021  NEXT APPOINTMENT DATE:@3 /22/2021  DO WE HAVE YOUR PERMISSION TO LEAVE A DETAILED MESSAGE:  OTHER COMMENTS: needs a PA   **Let patient know to contact pharmacy at the end of the day to make sure medication is ready. **  ** Please notify patient to allow 48-72 hours to process**  **Encourage patient to contact the pharmacy for refills or they can request refills through [facility]**

## 2019-11-02 NOTE — Telephone Encounter (Signed)
In light of numerous requests to complete PA regardless of the fact that [facility] has nt faxed this request NOR a request having been initiated through Cover My Meds, PA has been initiated. NOTE - office policy regarding time frame in which to complete PA will and does apply.  PRIOR AUTHORIZATION  PA initiation date: 11/02/19  Medication: Invokana 300mg  Insurance Company: [employer] Submission completed electronically through [employer] My [employer]s: Yes  Will await insurance response re: approval/denial.  Kimberly [name] (Key: [id number])  Your information has been sent to [employer].  [employer]eds has established a business relationship with the Engineer, production that results in a differentiated user experience on CoverMyMeds for the PA process, and may include patient support services. There may be lower cost medications available or preferred on your patient's plan. Kimberly [name] Key: [id number] - PA Case ID: [id number] Need help? Call us at [phone] Status Sent to [place] 300MG  tablets Form [employer] Medicare Part D Electronic Prior Authorization Form (2017 NCPDP)

## 2019-11-02 NOTE — Telephone Encounter (Signed)
UPDATE  Kimberly [name] (Key: [id number])  [employer] is reviewing your PA request. Typically an electronic response will be received within 72 hours. To check for an update later, open this request from your dashboard.  You may close this dialog and return to your dashboard to perform other tasks.

## 2019-11-02 NOTE — Telephone Encounter (Signed)
All fax machines have been cleared at the time of this entry. A PA request from Cover My meds has NOT come through the fax machine NOR has been initiated through Cover My Meds online.

## 2019-11-02 NOTE — Telephone Encounter (Signed)
Original Rx was sent to [facility]. Re-sent Rx to requested pharmacy. In terms of requested PA, will await response or request from [facility] to determine if PA is truly needed. Possible that since this refill was sent to [facility], it is a refill too soon not requiring a PA but will still await response from [facility].

## 2019-11-02 NOTE — Telephone Encounter (Signed)
Outpatient Medication Detail   Disp Refills Start End   dapagliflozin propanediol (FARXIGA) 10 MG TABS tablet 30 tablet 11 11/02/2019    Sig - Route: Take 10 mg by mouth daily. - Oral   Sent to pharmacy as: dapagliflozin propanediol (FARXIGA) 10 MG Tab tablet   E-Prescribing Status: Receipt confirmed by pharmacy (11/02/2019 4:55 PM EST)    Based on denial, pt must have tried AND failed [place]. Per Dr. Cordelia Pen orders, Rx has been sent as indicated above. Attempted to call pt to further discuss denial and new orders. Unable to reach. Will continue efforts to reach pt.

## 2019-11-03 NOTE — Telephone Encounter (Signed)
Called pt and informed her about the denial and the rationale for changing Rx to [place]. Verbalized acceptance and understanding.

## 2019-11-13 ENCOUNTER — Other Ambulatory Visit: Payer: Self-pay | Admitting: Endocrinology

## 2019-11-16 ENCOUNTER — Ambulatory Visit: Payer: Medicare Other | Admitting: Endocrinology

## 2019-11-19 ENCOUNTER — Ambulatory Visit (INDEPENDENT_AMBULATORY_CARE_PROVIDER_SITE_OTHER): Payer: Medicare Other | Admitting: Endocrinology

## 2019-11-19 ENCOUNTER — Other Ambulatory Visit: Payer: Self-pay

## 2019-11-19 ENCOUNTER — Encounter: Payer: Self-pay | Admitting: Endocrinology

## 2019-11-19 VITALS — BP 120/70 | HR 92 | Ht 63.0 in | Wt 252.0 lb

## 2019-11-19 DIAGNOSIS — Z794 Long term (current) use of insulin: Secondary | ICD-10-CM | POA: Diagnosis not present

## 2019-11-19 DIAGNOSIS — E1149 Type 2 diabetes mellitus with other diabetic neurological complication: Secondary | ICD-10-CM

## 2019-11-19 DIAGNOSIS — Z5181 Encounter for therapeutic drug level monitoring: Secondary | ICD-10-CM | POA: Diagnosis not present

## 2019-11-19 DIAGNOSIS — Z7901 Long term (current) use of anticoagulants: Secondary | ICD-10-CM | POA: Diagnosis not present

## 2019-11-19 DIAGNOSIS — I6330 Cerebral infarction due to thrombosis of unspecified cerebral artery: Secondary | ICD-10-CM | POA: Diagnosis not present

## 2019-11-19 LAB — POCT GLYCOSYLATED HEMOGLOBIN (HGB A1C): Hemoglobin A1C: 8.1 % — AB (ref 4.0–5.6)

## 2019-11-19 MED ORDER — TRESIBA FLEXTOUCH 200 UNIT/ML ~~LOC~~ SOPN: 48.0000 [IU] | PEN_INJECTOR | Freq: Every day | SUBCUTANEOUS | 11 refills | Status: DC

## 2019-11-19 NOTE — Patient Instructions (Signed)
check your blood sugar twice a day.  vary the time of day when you check, between before the 3 meals, and at bedtime.  also check if you have symptoms of your blood sugar being too high or too low.  please keep a record of the readings and bring it to your next appointment here (or you can bring the meter itself).  You can write it on any piece of paper.  please call us sooner if your blood sugar goes below 70, or if you have a lot of readings over 200.  I have sent a prescription to your pharmacy, to increase the [place].   If ever you have fever while taking methimazole, stop it and call us, even if the reason is obvious, because of the risk of a rare side-effect.   Blood tests are requested for you today.  We'll let you know about the results.    Please come back for a follow-up appointment in 2 months.

## 2019-11-19 NOTE — Progress Notes (Signed)
Subjective:    Patient ID: Kimberly [name], female    DOB: 21-Apr-1961, 59 y.o.   MRN: [id number]  HPI Pt returns for f/u of DM: DM type: Insulin-requiring type 2.   Dx'ed: [id number] Complications: CVA Therapy: 2 oral meds, and insulin since 2019.   GDM: never DKA: never Severe hypoglycemia: never.  Pancreatitis: never Pancreatic imaging: never Other: she also took insulin 2011-2017; she declines multiple daily injections; she stopped lantus due to n/v; she tolerates tresiba well; edema limits rx options; she did not tolerate metformin (nausea).    Interval history: no cbg record, but states cbg's are in the low-100's.  Pt says she does not miss the [place], which pt says is 46 units QD.   Pt also has hyperthyroidism (dx'ed 2020; pt says she also had hyperthyroidism in 2010; it resolved; she was rx'ed tapazole in 2020 for recurrence; she has never had dedicated thyroid imaging; she chose tapazole rx; this was stopped 1/21, due to elev TSH).  Since off tapazole, pt states she feels no different, and well in general. Past Medical History:  Diagnosis Date  . Diabetes mellitus   . Hypertension   . Seizures ([place])    last seizure march 2016  . Stroke [facility])     Past Surgical History:  Procedure Laterality Date  . TRACHEOSTOMY CLOSURE      Social History   Socioeconomic History  . Marital status: Married    Spouse name: Not on file  . Number of children: Not on file  . Years of education: Not on file  . Highest education level: Not on file  Occupational History  . Not on file  Tobacco Use  . Smoking status: Never Smoker  . Smokeless tobacco: Never Used  Substance and Sexual Activity  . Alcohol use: No  . Drug use: No  . Sexual activity: Not on file  Other Topics Concern  . Not on file  Social History Narrative  . Not on file   Social Determinants of Health   Financial Resource Strain:   . Difficulty of Paying Living Expenses:   Food Insecurity:   . Worried About Paediatric nurse in the Last Year:   . Arboriculturist in the Last Year:   Transportation Needs:   . Film/video editor (Medical):   Marland Kitchen Lack of Transportation (Non-Medical):   Physical Activity:   . Days of Exercise per Week:   . Minutes of Exercise per Session:   Stress:   . Feeling of Stress :   Social Connections:   . Frequency of Communication with Friends and Family:   . Frequency of Social Gatherings with Friends and Family:   . Attends Religious Services:   . Active Member of Clubs or Organizations:   . Attends Archivist Meetings:   Marland Kitchen Marital Status:   Intimate Partner Violence:   . Fear of Current or Ex-Partner:   . Emotionally Abused:   Marland Kitchen Physically Abused:   . Sexually Abused:     Current Outpatient Medications on File Prior to Visit  Medication Sig Dispense Refill  . Accu-Chek Softclix Lancets lancets Use to monitor glucose levels once daily; E11.9 100 each 3  . baclofen (LIORESAL) 20 MG tablet Take 20 mg by mouth 3 (three) times daily.  2  . butalbital-acetaminophen-caffeine (FIORICET, ESGIC) 50-325-40 MG per tablet Take 1 tablet by mouth 2 (two) times daily as needed for headache.    . cholecalciferol (VITAMIN D) 1000 UNITS  tablet Take 1,000 Units by mouth daily.    . dapagliflozin propanediol (FARXIGA) 10 MG TABS tablet Take 10 mg by mouth daily. 30 tablet 11  . fish oil-omega-3 fatty acids 1000 MG capsule Take 1 g by mouth 3 (three) times daily.     Marland Kitchen glucose blood (ACCU-CHEK GUIDE) test strip 1 each by Other route 2 (two) times daily. And lancets 2/day 100 each 12  . hydrocortisone cream 0.5 % Apply topically as needed.    . Insulin Pen Needle (B-D UF III MINI PEN NEEDLES) 31G X 5 MM MISC 1 each by Other route daily. E11.9 100 each 3  . JANUVIA 100 MG tablet TAKE 1 TABLET BY MOUTH EVERY DAY 90 tablet 3  . lacosamide (VIMPAT) 200 MG TABS Take by mouth 2 (two) times daily.      Marland Kitchen latanoprost (XALATAN) 0.005 % ophthalmic solution INSTILL 1 DROP(S) IN EACH  EYE DAILY IN THE EVENING    . simvastatin (ZOCOR) 20 MG tablet Take 40 mg by mouth every evening.     . simvastatin (ZOCOR) 20 MG tablet Take 20 mg by mouth.    . traMADol (ULTRAM) 50 MG tablet Take 1 tablet (50 mg total) by mouth every 6 (six) hours as needed. 15 tablet 0  . warfarin (COUMADIN) 10 MG tablet Take 10 mg by mouth daily. Wed, Thurs and Fri 10 mg and all other days 7.5 mg    . zonisamide (ZONEGRAN) 100 MG capsule Take 300 mg by mouth.     No current facility-administered medications on file prior to visit.    No Known Allergies  Family History  Problem Relation Age of Onset  . Cancer Father   . Diabetes Neg Hx     BP 120/70   Pulse 92   Ht 5\' 3"  (1.6 m)   Wt 252 lb (114.3 kg)   SpO2 98%   BMI 44.64 kg/m    Review of Systems She denies hypoglycemia    Objective:   Physical Exam VITAL SIGNS:  See vs page GENERAL: no distress.  In wheelchair Pulses: dorsalis pedis intact bilat.   MSK: no deformity of the feet CV: trace bilat leg edema.   Skin:  no ulcer on the feet.  normal color and temp on the feet. Neuro: sensation is intact to touch on the feet  Lab Results  Component Value Date   HGBA1C 8.1 (A) 11/19/2019        Assessment & Plan:  Insulin-requiring type 2 DM, with CVA: worse.  I advised her to increase [place] to more than 48/d, but she declines.   Hyperthyroidism, now off rx: recheck today  Patient Instructions  check your blood sugar twice a day.  vary the time of day when you check, between before the 3 meals, and at bedtime.  also check if you have symptoms of your blood sugar being too high or too low.  please keep a record of the readings and bring it to your next appointment here (or you can bring the meter itself).  You can write it on any piece of paper.  please call us sooner if your blood sugar goes below 70, or if you have a lot of readings over 200.  I have sent a prescription to your pharmacy, to increase the [place].   If ever you  have fever while taking methimazole, stop it and call us, even if the reason is obvious, because of the risk of a rare side-effect.  Blood tests are requested for you today.  We'll let you know about the results.    Please come back for a follow-up appointment in 2 months.

## 2019-11-20 LAB — T4, FREE: Free T4: 0.76 ng/dL (ref 0.60–1.60)

## 2019-11-20 LAB — TSH: TSH: 2.48 u[IU]/mL (ref 0.35–4.50)

## 2019-12-04 DIAGNOSIS — I10 Essential (primary) hypertension: Secondary | ICD-10-CM | POA: Diagnosis not present

## 2019-12-04 DIAGNOSIS — Z0110 Encounter for examination of ears and hearing without abnormal findings: Secondary | ICD-10-CM | POA: Diagnosis not present

## 2019-12-04 DIAGNOSIS — Z0000 Encounter for general adult medical examination without abnormal findings: Secondary | ICD-10-CM | POA: Diagnosis not present

## 2019-12-04 DIAGNOSIS — E1165 Type 2 diabetes mellitus with hyperglycemia: Secondary | ICD-10-CM | POA: Diagnosis not present

## 2019-12-04 DIAGNOSIS — E782 Mixed hyperlipidemia: Secondary | ICD-10-CM | POA: Diagnosis not present

## 2019-12-12 ENCOUNTER — Ambulatory Visit: Payer: Medicare Other

## 2019-12-16 DIAGNOSIS — R921 Mammographic calcification found on diagnostic imaging of breast: Secondary | ICD-10-CM | POA: Diagnosis not present

## 2019-12-17 DIAGNOSIS — I6330 Cerebral infarction due to thrombosis of unspecified cerebral artery: Secondary | ICD-10-CM | POA: Diagnosis not present

## 2019-12-17 DIAGNOSIS — Z5181 Encounter for therapeutic drug level monitoring: Secondary | ICD-10-CM | POA: Diagnosis not present

## 2019-12-17 DIAGNOSIS — Z7901 Long term (current) use of anticoagulants: Secondary | ICD-10-CM | POA: Diagnosis not present

## 2019-12-19 ENCOUNTER — Ambulatory Visit: Payer: Medicare Other | Attending: Internal Medicine

## 2019-12-19 DIAGNOSIS — Z23 Encounter for immunization: Secondary | ICD-10-CM

## 2019-12-19 NOTE — Progress Notes (Signed)
   Covid-19 Vaccination Clinic  Name:  Kimberly [name]    MRN: [id number] DOB: 11-09-1960  12/19/2019  [name] was observed post Covid-19 immunization for 15 minutes without incident. She was provided with Vaccine Information Sheet and instruction to access the V-Safe system.   [name] was instructed to call 911 with any severe reactions post vaccine: Marland Kitchen Difficulty breathing  . Swelling of face and throat  . A fast heartbeat  . A bad rash all over body  . Dizziness and weakness   Immunizations Administered    Name Date Dose VIS Date Route   Pfizer COVID-19 Vaccine 12/19/2019  1:57 PM 0.3 mL 10/21/2018 Intramuscular   Manufacturer: [place]   Lot: [id number]   [place]: [id number]

## 2019-12-29 DIAGNOSIS — E119 Type 2 diabetes mellitus without complications: Secondary | ICD-10-CM | POA: Diagnosis not present

## 2019-12-29 DIAGNOSIS — H2513 Age-related nuclear cataract, bilateral: Secondary | ICD-10-CM | POA: Diagnosis not present

## 2019-12-29 DIAGNOSIS — H35033 Hypertensive retinopathy, bilateral: Secondary | ICD-10-CM | POA: Diagnosis not present

## 2019-12-29 DIAGNOSIS — H401112 Primary open-angle glaucoma, right eye, moderate stage: Secondary | ICD-10-CM | POA: Diagnosis not present

## 2019-12-29 DIAGNOSIS — H5203 Hypermetropia, bilateral: Secondary | ICD-10-CM | POA: Diagnosis not present

## 2019-12-29 DIAGNOSIS — H401121 Primary open-angle glaucoma, left eye, mild stage: Secondary | ICD-10-CM | POA: Diagnosis not present

## 2020-01-04 ENCOUNTER — Ambulatory Visit: Payer: Medicare Other

## 2020-01-08 DIAGNOSIS — Z5181 Encounter for therapeutic drug level monitoring: Secondary | ICD-10-CM | POA: Diagnosis not present

## 2020-01-08 DIAGNOSIS — Z7901 Long term (current) use of anticoagulants: Secondary | ICD-10-CM | POA: Diagnosis not present

## 2020-01-08 DIAGNOSIS — I6330 Cerebral infarction due to thrombosis of unspecified cerebral artery: Secondary | ICD-10-CM | POA: Diagnosis not present

## 2020-01-11 ENCOUNTER — Ambulatory Visit: Payer: Medicare Other | Attending: Internal Medicine

## 2020-01-11 DIAGNOSIS — Z23 Encounter for immunization: Secondary | ICD-10-CM

## 2020-01-11 NOTE — Progress Notes (Signed)
   Covid-19 Vaccination Clinic  Name:  Yaniah Norfleet    MRN: [id number] DOB: 08/03/1961  01/11/2020  Ms. Beal was observed post Covid-19 immunization for 15 minutes without incident. She was provided with Vaccine Information Sheet and instruction to access the V-Safe system.   Ms. Cobey was instructed to call 911 with any severe reactions post vaccine: Marland Kitchen Difficulty breathing  . Swelling of face and throat  . A fast heartbeat  . A bad rash all over body  . Dizziness and weakness   Immunizations Administered    Name Date Dose VIS Date Route   Pfizer COVID-19 Vaccine 01/11/2020 10:04 AM 0.3 mL 10/21/2018 Intramuscular   Manufacturer: [place]   Lot: [id number]   [place]: [id number]

## 2020-01-18 ENCOUNTER — Other Ambulatory Visit: Payer: Self-pay

## 2020-01-20 ENCOUNTER — Ambulatory Visit (INDEPENDENT_AMBULATORY_CARE_PROVIDER_SITE_OTHER): Payer: Medicare Other | Admitting: Endocrinology

## 2020-01-20 ENCOUNTER — Other Ambulatory Visit: Payer: Self-pay

## 2020-01-20 ENCOUNTER — Encounter: Payer: Self-pay | Admitting: Endocrinology

## 2020-01-20 VITALS — BP 130/70 | HR 97 | Ht 63.0 in | Wt 251.0 lb

## 2020-01-20 DIAGNOSIS — Z794 Long term (current) use of insulin: Secondary | ICD-10-CM | POA: Diagnosis not present

## 2020-01-20 DIAGNOSIS — E1149 Type 2 diabetes mellitus with other diabetic neurological complication: Secondary | ICD-10-CM

## 2020-01-20 LAB — POCT GLYCOSYLATED HEMOGLOBIN (HGB A1C): Hemoglobin A1C: 9 % — AB (ref 4.0–5.6)

## 2020-01-20 MED ORDER — RYBELSUS 3 MG PO TABS: 3.0000 mg | ORAL_TABLET | Freq: Every day | ORAL | 11 refills | Status: DC

## 2020-01-20 NOTE — Progress Notes (Signed)
Subjective:    Patient ID: Kimberly [name], female    DOB: [birth year], 59 y.o.   MRN: [id number]  HPI Pt returns for f/u of DM: DM type: Insulin-requiring type 2.   Dx'ed: [id number] Complications: CVA Therapy: 2 oral meds, and insulin since 2019.   GDM: never DKA: never Severe hypoglycemia: never.  Pancreatitis: never Pancreatic imaging: never Other: she also took insulin 2011-2017; she declines multiple daily injections; she stopped lantus due to n/v; she tolerates tresiba well; edema limits rx options; she did not tolerate metformin or Trulicity (nausea with each).    Interval history: no cbg record, but states cbg's vary from 120-165.  Pt says she does not miss the [place].    Pt also has hyperthyroidism (dx'ed 2020; pt says she also had hyperthyroidism in 2010; it resolved; she was rx'ed tapazole in 2020 for recurrence; she has never had dedicated thyroid imaging; she chose tapazole rx; this was stopped 1/21, due to elev TSH).  Since off tapazole, pt states she feels no different, and well in general. Past Medical History:  Diagnosis Date  . Diabetes mellitus   . Hypertension   . Seizures ([place])    last seizure march 2016  . Stroke [facility])     Past Surgical History:  Procedure Laterality Date  . TRACHEOSTOMY CLOSURE      Social History   Socioeconomic History  . Marital status: Married    Spouse name: Not on file  . Number of children: Not on file  . Years of education: Not on file  . Highest education level: Not on file  Occupational History  . Not on file  Tobacco Use  . Smoking status: Never Smoker  . Smokeless tobacco: Never Used  Substance and Sexual Activity  . Alcohol use: No  . Drug use: No  . Sexual activity: Not on file  Other Topics Concern  . Not on file  Social History Narrative  . Not on file   Social Determinants of Health   Financial Resource Strain:   . Difficulty of Paying Living Expenses:   Food Insecurity:   . Worried About Charity fundraiser  in the Last Year:   . Arboriculturist in the Last Year:   Transportation Needs:   . Film/video editor (Medical):   Marland Kitchen Lack of Transportation (Non-Medical):   Physical Activity:   . Days of Exercise per Week:   . Minutes of Exercise per Session:   Stress:   . Feeling of Stress :   Social Connections:   . Frequency of Communication with Friends and Family:   . Frequency of Social Gatherings with Friends and Family:   . Attends Religious Services:   . Active Member of Clubs or Organizations:   . Attends Archivist Meetings:   Marland Kitchen Marital Status:   Intimate Partner Violence:   . Fear of Current or Ex-Partner:   . Emotionally Abused:   Marland Kitchen Physically Abused:   . Sexually Abused:     Current Outpatient Medications on File Prior to Visit  Medication Sig Dispense Refill  . Accu-Chek Softclix Lancets lancets Use to monitor glucose levels once daily; E11.9 100 each 3  . baclofen (LIORESAL) 20 MG tablet Take 20 mg by mouth 3 (three) times daily.  2  . butalbital-acetaminophen-caffeine (FIORICET, ESGIC) 50-325-40 MG per tablet Take 1 tablet by mouth 2 (two) times daily as needed for headache.    . cholecalciferol (VITAMIN D) 1000 UNITS tablet Take 1,000  Units by mouth daily.    . dapagliflozin propanediol (FARXIGA) 10 MG TABS tablet Take 10 mg by mouth daily. 30 tablet 11  . fish oil-omega-3 fatty acids 1000 MG capsule Take 1 g by mouth 3 (three) times daily.     Marland Kitchen glucose blood (ACCU-CHEK GUIDE) test strip 1 each by Other route 2 (two) times daily. And lancets 2/day 100 each 12  . hydrocortisone cream 0.5 % Apply topically as needed.    . insulin degludec (TRESIBA FLEXTOUCH) 200 UNIT/ML FlexTouch Pen Inject 48 Units into the skin daily. And pen needles 1/day 9 pen 11  . Insulin Pen Needle (B-D UF III MINI PEN NEEDLES) 31G X 5 MM MISC 1 each by Other route daily. E11.9 100 each 3  . lacosamide (VIMPAT) 200 MG TABS Take by mouth 2 (two) times daily.      Marland Kitchen latanoprost (XALATAN)  0.005 % ophthalmic solution INSTILL 1 DROP(S) IN EACH EYE DAILY IN THE EVENING    . simvastatin (ZOCOR) 20 MG tablet Take 40 mg by mouth every evening.     . simvastatin (ZOCOR) 20 MG tablet Take 20 mg by mouth.    . traMADol (ULTRAM) 50 MG tablet Take 1 tablet (50 mg total) by mouth every 6 (six) hours as needed. 15 tablet 0  . warfarin (COUMADIN) 10 MG tablet Take 10 mg by mouth daily. Wed, Thurs and Fri 10 mg and all other days 7.5 mg    . zonisamide (ZONEGRAN) 100 MG capsule Take 300 mg by mouth.     No current facility-administered medications on file prior to visit.    No Known Allergies  Family History  Problem Relation Age of Onset  . Cancer Father   . Diabetes Neg Hx     BP 130/70   Pulse 97   Ht 5\' 3"  (1.6 m)   Wt 251 lb (113.9 kg)   SpO2 98%   BMI 44.46 kg/m    Review of Systems She denies hypoglycemia.      Objective:   Physical Exam VITAL SIGNS:  See vs page GENERAL: no distress.  In wheelchair Pulses: dorsalis pedis intact bilat.   MSK: no deformity of the feet CV: no leg edema Skin:  no ulcer on the feet.  normal color and temp on the feet. Neuro: sensation is intact to touch on the feet.    Lab Results  Component Value Date   HGBA1C 9.0 (A) 01/20/2020      Assessment & Plan:  Insulin-requiring type 2 DM, with CVA: worse.  Pt is very hesitant to increase rx.  Patient Instructions  check your blood sugar twice a day.  vary the time of day when you check, between before the 3 meals, and at bedtime.  also check if you have symptoms of your blood sugar being too high or too low.  please keep a record of the readings and bring it to your next appointment here (or you can bring the meter itself).  You can write it on any piece of paper.  please call us sooner if your blood sugar goes below 70, or if you have a lot of readings over 200.  I have sent a prescription to your pharmacy, to change Januvia to Rybelsus.  If you get nausea, take just 1/2 pill If  ever you have fever while taking methimazole, stop it and call us, even if the reason is obvious, because of the risk of a rare side-effect.    Please  come back for a follow-up appointment in 2 months.

## 2020-01-20 NOTE — Patient Instructions (Addendum)
check your blood sugar twice a day.  vary the time of day when you check, between before the 3 meals, and at bedtime.  also check if you have symptoms of your blood sugar being too high or too low.  please keep a record of the readings and bring it to your next appointment here (or you can bring the meter itself).  You can write it on any piece of paper.  please call us sooner if your blood sugar goes below 70, or if you have a lot of readings over 200.  I have sent a prescription to your pharmacy, to change Januvia to Rybelsus.  If you get nausea, take just 1/2 pill If ever you have fever while taking methimazole, stop it and call us, even if the reason is obvious, because of the risk of a rare side-effect.    Please come back for a follow-up appointment in 2 months.

## 2020-01-21 ENCOUNTER — Encounter: Payer: Self-pay | Admitting: Endocrinology

## 2020-02-05 DIAGNOSIS — Z7901 Long term (current) use of anticoagulants: Secondary | ICD-10-CM | POA: Diagnosis not present

## 2020-02-05 DIAGNOSIS — I6330 Cerebral infarction due to thrombosis of unspecified cerebral artery: Secondary | ICD-10-CM | POA: Diagnosis not present

## 2020-02-05 DIAGNOSIS — Z5181 Encounter for therapeutic drug level monitoring: Secondary | ICD-10-CM | POA: Diagnosis not present

## 2020-02-28 ENCOUNTER — Other Ambulatory Visit: Payer: Self-pay | Admitting: Endocrinology

## 2020-02-28 DIAGNOSIS — E1149 Type 2 diabetes mellitus with other diabetic neurological complication: Secondary | ICD-10-CM

## 2020-02-28 DIAGNOSIS — Z794 Long term (current) use of insulin: Secondary | ICD-10-CM

## 2020-03-03 DIAGNOSIS — I6789 Other cerebrovascular disease: Secondary | ICD-10-CM | POA: Diagnosis not present

## 2020-03-03 DIAGNOSIS — R569 Unspecified convulsions: Secondary | ICD-10-CM | POA: Diagnosis not present

## 2020-03-03 DIAGNOSIS — Z5181 Encounter for therapeutic drug level monitoring: Secondary | ICD-10-CM | POA: Diagnosis not present

## 2020-03-03 DIAGNOSIS — I6330 Cerebral infarction due to thrombosis of unspecified cerebral artery: Secondary | ICD-10-CM | POA: Diagnosis not present

## 2020-03-03 DIAGNOSIS — E782 Mixed hyperlipidemia: Secondary | ICD-10-CM | POA: Diagnosis not present

## 2020-03-03 DIAGNOSIS — I10 Essential (primary) hypertension: Secondary | ICD-10-CM | POA: Diagnosis not present

## 2020-03-03 DIAGNOSIS — E1165 Type 2 diabetes mellitus with hyperglycemia: Secondary | ICD-10-CM | POA: Diagnosis not present

## 2020-03-03 DIAGNOSIS — Z7901 Long term (current) use of anticoagulants: Secondary | ICD-10-CM | POA: Diagnosis not present

## 2020-03-05 ENCOUNTER — Other Ambulatory Visit: Payer: Self-pay | Admitting: Endocrinology

## 2020-03-05 DIAGNOSIS — E119 Type 2 diabetes mellitus without complications: Secondary | ICD-10-CM

## 2020-03-18 ENCOUNTER — Ambulatory Visit (INDEPENDENT_AMBULATORY_CARE_PROVIDER_SITE_OTHER): Payer: Medicare Other | Admitting: Podiatry

## 2020-03-18 ENCOUNTER — Other Ambulatory Visit: Payer: Self-pay

## 2020-03-18 DIAGNOSIS — M79674 Pain in right toe(s): Secondary | ICD-10-CM

## 2020-03-18 DIAGNOSIS — Z794 Long term (current) use of insulin: Secondary | ICD-10-CM

## 2020-03-18 DIAGNOSIS — I69354 Hemiplegia and hemiparesis following cerebral infarction affecting left non-dominant side: Secondary | ICD-10-CM

## 2020-03-18 DIAGNOSIS — M79675 Pain in left toe(s): Secondary | ICD-10-CM | POA: Diagnosis not present

## 2020-03-18 DIAGNOSIS — E119 Type 2 diabetes mellitus without complications: Secondary | ICD-10-CM

## 2020-03-18 DIAGNOSIS — B351 Tinea unguium: Secondary | ICD-10-CM | POA: Diagnosis not present

## 2020-03-21 ENCOUNTER — Encounter: Payer: Self-pay | Admitting: Podiatry

## 2020-03-21 NOTE — Progress Notes (Addendum)
Subjective:  Patient ID: Kimberly [name], female    DOB: 1960/10/15,  MRN: [id number]  59 y.o. female presents with preventative diabetic foot care and painful thick toenails that are difficult to trim. Pain interferes with ambulation. Aggravating factors include wearing enclosed shoe gear. Pain is relieved with periodic professional debridement.   Patient states her shoes are wearing out on her LLE where she uses her AFO.  Review of Systems: Negative except as noted in the HPI. Denies N/V/F/Ch.  Past Medical History:  Diagnosis Date   Diabetes mellitus    Hypertension    Seizures ([place])    last seizure march 2016   Stroke [facility])    Past Surgical History:  Procedure Laterality Date   TRACHEOSTOMY CLOSURE     Patient Active Problem List   Diagnosis Date Noted   Hyperthyroidism 03/11/2019   Diabetes ([place]) 08/12/2018   Palpitations 04/16/2017   Shortness of breath 04/16/2017   Abnormal INR 01/04/2016   Encounter for therapeutic drug level monitoring 08/31/2015   Long term current use of anticoagulant therapy 08/31/2015   Spastic hemiplegia affecting nondominant side ([place]) 10/15/2012   Myalgia and myositis, unspecified 10/15/2012   Cerebral thrombosis with cerebral infarction ([place]) 07/01/2012   Localization-related (focal) (partial) epilepsy and epileptic syndromes with complex partial seizures, without mention of intractable epilepsy 07/01/2012   Extremity pain 07/01/2012   Partial epilepsy with impairment of consciousness, intractable ([place]) 07/01/2012   CVA (cerebral infarction) 02/11/2012   Contracture of left Achilles tendon 02/11/2012    Current Outpatient Medications:    ACCU-CHEK GUIDE test strip, CHECK BLOOD SUGARS TWICE A DAY, Disp: 100 strip, Rfl: 12   Accu-Chek Softclix Lancets lancets, Use to monitor glucose levels once daily; E11.9, Disp: 100 each, Rfl: 3   B-D UF III MINI PEN NEEDLES 31G X 5 MM MISC, 1 EACH BY OTHER ROUTE DAILY. E11.9, Disp: 90  each, Rfl: 3   baclofen (LIORESAL) 20 MG tablet, Take 20 mg by mouth 3 (three) times daily., Disp: , Rfl: 2   butalbital-acetaminophen-caffeine (FIORICET, ESGIC) 50-325-40 MG per tablet, Take 1 tablet by mouth 2 (two) times daily as needed for headache., Disp: , Rfl:    cholecalciferol (VITAMIN D) 1000 UNITS tablet, Take 1,000 Units by mouth daily., Disp: , Rfl:    dapagliflozin propanediol (FARXIGA) 10 MG TABS tablet, Take 10 mg by mouth daily., Disp: 30 tablet, Rfl: 11   fish oil-omega-3 fatty acids 1000 MG capsule, Take 1 g by mouth 3 (three) times daily. , Disp: , Rfl:    hydrocortisone cream 0.5 %, Apply topically as needed., Disp: , Rfl:    ibuprofen (ADVIL) 800 MG tablet, Take 800 mg by mouth every 8 (eight) hours as needed., Disp: , Rfl:    insulin degludec (TRESIBA FLEXTOUCH) 200 UNIT/ML FlexTouch Pen, Inject 48 Units into the skin daily. And pen needles 1/day, Disp: 9 pen, Rfl: 11   lacosamide (VIMPAT) 200 MG TABS, Take by mouth 2 (two) times daily.  , Disp: , Rfl:    latanoprost (XALATAN) 0.005 % ophthalmic solution, INSTILL 1 DROP(S) IN [facility] EYE DAILY IN THE EVENING, Disp: , Rfl:    ofloxacin (FLOXIN) 0.3 % OTIC solution, 5 drops 2 (two) times daily., Disp: , Rfl:    Semaglutide (RYBELSUS) 3 MG TABS, Take 3 mg by mouth daily., Disp: 30 tablet, Rfl: 11   simvastatin (ZOCOR) 20 MG tablet, Take 40 mg by mouth every evening. , Disp: , Rfl:    simvastatin (ZOCOR) 20 MG tablet, Take  20 mg by mouth., Disp: , Rfl:    simvastatin (ZOCOR) 40 MG tablet, Take 40 mg by mouth at bedtime., Disp: , Rfl:    traMADol (ULTRAM) 50 MG tablet, Take 1 tablet (50 mg total) by mouth every 6 (six) hours as needed., Disp: 15 tablet, Rfl: 0   warfarin (COUMADIN) 10 MG tablet, Take 10 mg by mouth daily. Wed, Thurs and Fri 10 mg and all other days 7.5 mg, Disp: , Rfl:    zonisamide (ZONEGRAN) 100 MG capsule, Take 300 mg by mouth., Disp: , Rfl:  No Known Allergies Social History   Tobacco Use    Smoking Status Never Smoker  Smokeless Tobacco Never Used   Objective:  There were no vitals filed for this visit. Constitutional Patient is a pleasant 59 y.o. African American female in morbidly obese in NAD. AAO x 3.  Vascular Capillary refill time to digits immediate b/l. Palpable pedal pulses b/l LE. Pedal hair absent. Lower extremity skin temperature gradient within normal limits. No pain with calf compression b/l. No edema noted b/l lower extremities. No cyanosis or clubbing noted.  Neurologic Normal speech. Oriented to person, place, and time. Protective sensation decreased with 10 gram monofilament b/l. Vibratory sensation diminished b/l.  Dermatologic Pedal skin with normal turgor, texture and tone bilaterally. No open wounds bilaterally. No interdigital macerations bilaterally. Toenails 1-5 b/l elongated, discolored, dystrophic, thickened, crumbly with subungual debris and tenderness to dorsal palpation.  Orthopedic: Muscle strength 5/5 to all LE muscle groups of right lower extremity. Flaccid lower extremities noted LLE. No pain crepitus or joint limitation noted with ROM b/l. No gross bony deformities bilaterally. Evaluation of shoe for left foot shows layer of shoe on posterolateral edge of heel. AFO for LLE has wide heel base which is wearing on the posterior aspect of her current diabetic shoe.   Hemoglobin A1C Latest Ref Rng & Units 01/20/2020 11/19/2019 09/21/2019 06/17/2019 04/22/2019  HGBA1C 4.0 - 5.6 % 9.0(A) 8.1(A) 7.5(A) 6.8(A) 7.0(A)  Some recent data might be hidden   Assessment:   1. Pain due to onychomycosis of toenails of both feet   2. Spastic hemiplegia of left nondominant side as late effect of cerebral infarction ([place])   3. Type 2 diabetes mellitus without complication, with long-term current use of insulin ([place])    Plan:  Patient was evaluated and treated and all questions answered.  Onychomycosis with pain -Nails palliatively debridement as below. -Educated  on self-care  Procedure: Nail Debridement Rationale: Pain Type of Debridement: manual, sharp debridement. Instrumentation: Nail nipper, rotary burr. Number of Nails: 10  -Examined patient. -No new findings. No new orders. -Continue diabetic foot care principles. -Toenails 1-5 b/l were debrided in length and girth with sterile nail nippers and dremel without iatrogenic bleeding.  -Patient to report any pedal injuries to medical professional immediately. -Patient to continue diabetic shoes daily. Husband will glue sole back onto shoe. Explained to patient and husband she may benefit from [employer] which provides more room for the heel of her AFO. -Patient/POA to call should there be question/concern in the interim.  Return in about 3 months (around 06/18/2020) for diabetic nail and callus trim.  Marzetta Board, DPM

## 2020-03-29 ENCOUNTER — Ambulatory Visit: Payer: Medicare Other | Admitting: Endocrinology

## 2020-03-31 DIAGNOSIS — Z5181 Encounter for therapeutic drug level monitoring: Secondary | ICD-10-CM | POA: Diagnosis not present

## 2020-03-31 DIAGNOSIS — Z7901 Long term (current) use of anticoagulants: Secondary | ICD-10-CM | POA: Diagnosis not present

## 2020-03-31 DIAGNOSIS — I6330 Cerebral infarction due to thrombosis of unspecified cerebral artery: Secondary | ICD-10-CM | POA: Diagnosis not present

## 2020-04-13 ENCOUNTER — Encounter: Payer: Self-pay | Admitting: Endocrinology

## 2020-04-13 ENCOUNTER — Other Ambulatory Visit: Payer: Self-pay

## 2020-04-13 ENCOUNTER — Ambulatory Visit (INDEPENDENT_AMBULATORY_CARE_PROVIDER_SITE_OTHER): Payer: Medicare Other | Admitting: Endocrinology

## 2020-04-13 VITALS — BP 122/84 | HR 86 | Ht 63.0 in | Wt 253.8 lb

## 2020-04-13 DIAGNOSIS — Z794 Long term (current) use of insulin: Secondary | ICD-10-CM

## 2020-04-13 DIAGNOSIS — E1149 Type 2 diabetes mellitus with other diabetic neurological complication: Secondary | ICD-10-CM | POA: Diagnosis not present

## 2020-04-13 LAB — POCT GLYCOSYLATED HEMOGLOBIN (HGB A1C): Hemoglobin A1C: 9.7 % — AB (ref 4.0–5.6)

## 2020-04-13 LAB — TSH: TSH: 1.16 u[IU]/mL (ref 0.35–4.50)

## 2020-04-13 LAB — T4, FREE: Free T4: 0.82 ng/dL (ref 0.60–1.60)

## 2020-04-13 MED ORDER — TRESIBA FLEXTOUCH 200 UNIT/ML ~~LOC~~ SOPN
56.0000 [IU] | PEN_INJECTOR | Freq: Every day | SUBCUTANEOUS | 11 refills | Status: DC
Start: 2020-04-13 — End: 2020-12-05

## 2020-04-13 NOTE — Patient Instructions (Addendum)
check your blood sugar twice a day.  vary the time of day when you check, between before the 3 meals, and at bedtime.  also check if you have symptoms of your blood sugar being too high or too low.  please keep a record of the readings and bring it to your next appointment here (or you can bring the meter itself).  You can write it on any piece of paper.  please call us sooner if your blood sugar goes below 70, or if you have a lot of readings over 200.  Blood tests are requested for you today.  We'll let you know about the results.  Please increase the Tresiba to 56 units daily.   Please come back for a follow-up appointment in 2 months.

## 2020-04-13 NOTE — Progress Notes (Signed)
Subjective:    Patient ID: Kimberly [name], female    DOB: 1960-10-27, 59 y.o.   MRN: [id number]  HPI Pt returns for f/u of DM: DM type: Insulin-requiring type 2.   Dx'ed: 4401 Complications: CVA Therapy: Insulin since 2019, and Farxiga.   GDM: never DKA: never Severe hypoglycemia: never.  Pancreatitis: never Pancreatic imaging: never.   Other: she also took insulin 2011-2017; she declines multiple daily injections; she stopped lantus due to n/v; she tolerates tresiba well; edema limits rx options; she did not tolerate metformin or Trulicity (nausea with each).   Interval history: she brings a record of her cbg's which I have reviewed today.  cbg's vary from 134-171.  Pt says she does not miss the [place].  She stopped Rybelsus (diarrhea).   Pt also has hyperthyroidism (dx'ed 2020; pt says she also had hyperthyroidism in 2010; it resolved; she was rx'ed tapazole in 2020 for recurrence; she has never had dedicated thyroid imaging; she chose tapazole rx; this was stopped 1/21, due to elev TSH).  Since off tapazole, pt states she feels no different, and well in general.   Past Medical History:  Diagnosis Date  . Diabetes mellitus   . Hypertension   . Seizures ([place])    last seizure march 2016  . Stroke [facility])     Past Surgical History:  Procedure Laterality Date  . TRACHEOSTOMY CLOSURE      Social History   Socioeconomic History  . Marital status: Married    Spouse name: Not on file  . Number of children: Not on file  . Years of education: Not on file  . Highest education level: Not on file  Occupational History  . Not on file  Tobacco Use  . Smoking status: Never Smoker  . Smokeless tobacco: Never Used  Substance and Sexual Activity  . Alcohol use: No  . Drug use: No  . Sexual activity: Not on file  Other Topics Concern  . Not on file  Social History Narrative  . Not on file   Social Determinants of Health   Financial Resource Strain:   . Difficulty of Paying  Living Expenses: Not on file  Food Insecurity:   . Worried About Charity fundraiser in the Last Year: Not on file  . Ran Out of Food in the Last Year: Not on file  Transportation Needs:   . Lack of Transportation (Medical): Not on file  . Lack of Transportation (Non-Medical): Not on file  Physical Activity:   . Days of Exercise per Week: Not on file  . Minutes of Exercise per Session: Not on file  Stress:   . Feeling of Stress : Not on file  Social Connections:   . Frequency of Communication with Friends and Family: Not on file  . Frequency of Social Gatherings with Friends and Family: Not on file  . Attends Religious Services: Not on file  . Active Member of Clubs or Organizations: Not on file  . Attends Archivist Meetings: Not on file  . Marital Status: Not on file  Intimate Partner Violence:   . Fear of Current or Ex-Partner: Not on file  . Emotionally Abused: Not on file  . Physically Abused: Not on file  . Sexually Abused: Not on file    Current Outpatient Medications on File Prior to Visit  Medication Sig Dispense Refill  . ACCU-CHEK GUIDE test strip CHECK BLOOD SUGARS TWICE A DAY (Patient taking differently: 1 each by Other route  daily. E11.9) 100 strip 12  . Accu-Chek Softclix Lancets lancets Use to monitor glucose levels once daily; E11.9 (Patient taking differently: 1 each by Other route daily. E11.9) 100 each 3  . B-D UF III MINI PEN NEEDLES 31G X 5 MM MISC 1 EACH BY OTHER ROUTE DAILY. E11.9 90 each 3  . baclofen (LIORESAL) 20 MG tablet Take 20 mg by mouth 3 (three) times daily.  2  . butalbital-acetaminophen-caffeine (FIORICET, ESGIC) 50-325-40 MG per tablet Take 1 tablet by mouth 2 (two) times daily as needed for headache.    . cholecalciferol (VITAMIN D) 1000 UNITS tablet Take 1,000 Units by mouth daily.    . dapagliflozin propanediol (FARXIGA) 10 MG TABS tablet Take 10 mg by mouth daily. 30 tablet 11  . fish oil-omega-3 fatty acids 1000 MG capsule Take 1  g by mouth 3 (three) times daily.     . hydrocortisone cream 0.5 % Apply topically as needed.    Marland Kitchen ibuprofen (ADVIL) 800 MG tablet Take 800 mg by mouth every 8 (eight) hours as needed.    . lacosamide (VIMPAT) 200 MG TABS Take by mouth 2 (two) times daily.      Marland Kitchen latanoprost (XALATAN) 0.005 % ophthalmic solution INSTILL 1 DROP(S) IN EACH EYE DAILY IN THE EVENING    . ofloxacin (FLOXIN) 0.3 % OTIC solution 5 drops 2 (two) times daily.    . simvastatin (ZOCOR) 40 MG tablet Take 40 mg by mouth at bedtime.    . traMADol (ULTRAM) 50 MG tablet Take 1 tablet (50 mg total) by mouth every 6 (six) hours as needed. 15 tablet 0  . warfarin (COUMADIN) 10 MG tablet Take 10 mg by mouth daily. Wed, Thurs and Fri 10 mg and all other days 7.5 mg    . zonisamide (ZONEGRAN) 100 MG capsule Take 300 mg by mouth.     No current facility-administered medications on file prior to visit.    No Known Allergies  Family History  Problem Relation Age of Onset  . Cancer Father   . Diabetes Neg Hx     BP 122/84   Pulse 86   Ht 5\' 3"  (1.6 m)   Wt 253 lb 12.8 oz (115.1 kg)   SpO2 96%   BMI 44.96 kg/m    Review of Systems She denies hypoglycemia.      Objective:   Physical Exam VITAL SIGNS:  See vs page GENERAL: no distress.  In wheelchair Pulses: dorsalis pedis intact bilat.   MSK: no deformity of the feet CV: 1+ bilat leg edema Skin:  no ulcer on the feet.  normal color and temp on the feet. Neuro: sensation is intact to touch on the feet.    Lab Results  Component Value Date   HGBA1C 9.7 (A) 04/13/2020   Lab Results  Component Value Date   TSH 1.16 04/13/2020      Assessment & Plan:  Insulin-requiring type 2 DM, with CVA: worse.   Hyperthyroidism: well-controlled.  Please continue the same medication.    Patient Instructions  check your blood sugar twice a day.  vary the time of day when you check, between before the 3 meals, and at bedtime.  also check if you have symptoms of your blood  sugar being too high or too low.  please keep a record of the readings and bring it to your next appointment here (or you can bring the meter itself).  You can write it on any piece of paper.  please call us sooner if your blood sugar goes below 70, or if you have a lot of readings over 200.  Blood tests are requested for you today.  We'll let you know about the results.  Please increase the Tresiba to 56 units daily.   Please come back for a follow-up appointment in 2 months.

## 2020-04-15 LAB — FRUCTOSAMINE: Fructosamine: 399 umol/L — ABNORMAL HIGH (ref 205–285)

## 2020-04-27 DIAGNOSIS — I6330 Cerebral infarction due to thrombosis of unspecified cerebral artery: Secondary | ICD-10-CM | POA: Diagnosis not present

## 2020-04-27 DIAGNOSIS — Z7901 Long term (current) use of anticoagulants: Secondary | ICD-10-CM | POA: Diagnosis not present

## 2020-04-27 DIAGNOSIS — Z5181 Encounter for therapeutic drug level monitoring: Secondary | ICD-10-CM | POA: Diagnosis not present

## 2020-05-11 ENCOUNTER — Ambulatory Visit (INDEPENDENT_AMBULATORY_CARE_PROVIDER_SITE_OTHER): Payer: Medicare Other | Admitting: Podiatry

## 2020-05-11 ENCOUNTER — Other Ambulatory Visit: Payer: Self-pay

## 2020-05-11 DIAGNOSIS — L600 Ingrowing nail: Secondary | ICD-10-CM

## 2020-05-11 MED ORDER — NEOMYCIN-POLYMYXIN-HC 3.5-10000-1 OT SOLN: OTIC | 0 refills | Status: DC

## 2020-05-11 NOTE — Patient Instructions (Signed)

## 2020-05-12 NOTE — Progress Notes (Signed)
Subjective:   Patient ID: Kimberly [name], female   DOB: 59 y.o.   MRN: [id number]   HPI Patient presents stating that she has a painful ingrown toenail on her right big toe and that it is painful to touch.  She is not seeing any significant oozing or drainage but it is very painful and she cannot wear shoes and she presents with caregiver   ROS      Objective:  Physical Exam  Neurovascular status intact muscle strength was found to be adequate with patient found to have an incurvated right hallux medial border that is painful when pressed and make shoe gear difficult.  Patient is found to have good digital perfusion well oriented x3     Assessment:  Ingrown toenail deformity right hallux medial border with pain     Plan:  H&P reviewed condition and today I went ahead and I discussed correction allowing them to read consent form.  They want procedure and today I infiltrated the right hallux 60 mg like Marcaine mixture sterile prep done and using sterile instrumentation remove the medial border exposed matrix applied phenol applications 30 seconds followed by alcohol lavage sterile dressing.  Gave instructions on soaks and reappoint and encouraged to call with questions that may arise

## 2020-05-19 DIAGNOSIS — H401121 Primary open-angle glaucoma, left eye, mild stage: Secondary | ICD-10-CM | POA: Diagnosis not present

## 2020-05-19 DIAGNOSIS — H53431 Sector or arcuate defects, right eye: Secondary | ICD-10-CM | POA: Diagnosis not present

## 2020-05-19 DIAGNOSIS — H401112 Primary open-angle glaucoma, right eye, moderate stage: Secondary | ICD-10-CM | POA: Diagnosis not present

## 2020-05-25 DIAGNOSIS — E1165 Type 2 diabetes mellitus with hyperglycemia: Secondary | ICD-10-CM | POA: Diagnosis not present

## 2020-05-25 DIAGNOSIS — E782 Mixed hyperlipidemia: Secondary | ICD-10-CM | POA: Diagnosis not present

## 2020-05-25 DIAGNOSIS — I6789 Other cerebrovascular disease: Secondary | ICD-10-CM | POA: Diagnosis not present

## 2020-05-25 DIAGNOSIS — I10 Essential (primary) hypertension: Secondary | ICD-10-CM | POA: Diagnosis not present

## 2020-05-25 DIAGNOSIS — Z0001 Encounter for general adult medical examination with abnormal findings: Secondary | ICD-10-CM | POA: Diagnosis not present

## 2020-06-01 DIAGNOSIS — Z7901 Long term (current) use of anticoagulants: Secondary | ICD-10-CM | POA: Diagnosis not present

## 2020-06-01 DIAGNOSIS — I6330 Cerebral infarction due to thrombosis of unspecified cerebral artery: Secondary | ICD-10-CM | POA: Diagnosis not present

## 2020-06-01 DIAGNOSIS — Z5181 Encounter for therapeutic drug level monitoring: Secondary | ICD-10-CM | POA: Diagnosis not present

## 2020-06-03 ENCOUNTER — Encounter: Payer: Self-pay | Admitting: Endocrinology

## 2020-06-08 ENCOUNTER — Other Ambulatory Visit: Payer: Self-pay

## 2020-06-08 ENCOUNTER — Encounter: Payer: Self-pay | Admitting: Endocrinology

## 2020-06-08 ENCOUNTER — Ambulatory Visit (INDEPENDENT_AMBULATORY_CARE_PROVIDER_SITE_OTHER): Payer: Medicare Other | Admitting: Endocrinology

## 2020-06-08 VITALS — BP 122/80 | HR 96 | Ht 63.0 in | Wt 252.0 lb

## 2020-06-08 DIAGNOSIS — Z794 Long term (current) use of insulin: Secondary | ICD-10-CM

## 2020-06-08 DIAGNOSIS — E1149 Type 2 diabetes mellitus with other diabetic neurological complication: Secondary | ICD-10-CM | POA: Diagnosis not present

## 2020-06-08 LAB — POCT GLYCOSYLATED HEMOGLOBIN (HGB A1C): Hemoglobin A1C: 8.3 % — AB (ref 4.0–5.6)

## 2020-06-08 NOTE — Patient Instructions (Addendum)
check your blood sugar twice a day.  vary the time of day when you check, between before the 3 meals, and at bedtime.  also check if you have symptoms of your blood sugar being too high or too low.  please keep a record of the readings and bring it to your next appointment here (or you can bring the meter itself).  You can write it on any piece of paper.  please call us sooner if your blood sugar goes below 70, or if you have a lot of readings over 200.  Please continue the same [place] Please come back for a follow-up appointment in 3 months.

## 2020-06-08 NOTE — Progress Notes (Signed)
Subjective:    Patient ID: Kimberly [name], female    DOB: [birth year], 59 y.o.   MRN: [id number]  HPI Pt returns for f/u of DM: DM type: Insulin-requiring type 2.   Dx'ed: 5361 Complications: CVA Therapy: Insulin since 2019, and Farxiga.   GDM: never DKA: never Severe hypoglycemia: never.  Pancreatitis: never Pancreatic imaging: never.   Other: she also took insulin 2011-2017; she declines multiple daily injections; she stopped lantus due to n/v; she tolerates tresiba well; edema limits rx options; she did not tolerate metformin (nausea), Rybelsus (diarrhea), or Trulicity (nausea).   Interval history: she brings a record of her cbg's which I have reviewed today.  cbg's vary from 115-235.  Pt says she does not miss the [place].  Pt also has hyperthyroidism (dx'ed 2020; pt says she also had hyperthyroidism in 2010; it resolved; she was rx'ed tapazole in 2020 for recurrence; she has never had dedicated thyroid imaging; she chose tapazole rx; this was stopped 1/21, due to elev TSH).  Since off tapazole, pt states she feels no different, and well in general.  Past Medical History:  Diagnosis Date  . Diabetes mellitus   . Hypertension   . Seizures ([place])    last seizure march 2016  . Stroke [facility])     Past Surgical History:  Procedure Laterality Date  . TRACHEOSTOMY CLOSURE      Social History   Socioeconomic History  . Marital status: Married    Spouse name: Not on file  . Number of children: Not on file  . Years of education: Not on file  . Highest education level: Not on file  Occupational History  . Not on file  Tobacco Use  . Smoking status: Never Smoker  . Smokeless tobacco: Never Used  Substance and Sexual Activity  . Alcohol use: No  . Drug use: No  . Sexual activity: Not on file  Other Topics Concern  . Not on file  Social History Narrative  . Not on file   Social Determinants of Health   Financial Resource Strain:   . Difficulty of Paying Living Expenses: Not  on file  Food Insecurity:   . Worried About Charity fundraiser in the Last Year: Not on file  . Ran Out of Food in the Last Year: Not on file  Transportation Needs:   . Lack of Transportation (Medical): Not on file  . Lack of Transportation (Non-Medical): Not on file  Physical Activity:   . Days of Exercise per Week: Not on file  . Minutes of Exercise per Session: Not on file  Stress:   . Feeling of Stress : Not on file  Social Connections:   . Frequency of Communication with Friends and Family: Not on file  . Frequency of Social Gatherings with Friends and Family: Not on file  . Attends Religious Services: Not on file  . Active Member of Clubs or Organizations: Not on file  . Attends Archivist Meetings: Not on file  . Marital Status: Not on file  Intimate Partner Violence:   . Fear of Current or Ex-Partner: Not on file  . Emotionally Abused: Not on file  . Physically Abused: Not on file  . Sexually Abused: Not on file    Current Outpatient Medications on File Prior to Visit  Medication Sig Dispense Refill  . ACCU-CHEK GUIDE test strip CHECK BLOOD SUGARS TWICE A DAY (Patient taking differently: 1 each by Other route daily. E11.9) 100 strip 12  .  Accu-Chek Softclix Lancets lancets Use to monitor glucose levels once daily; E11.9 (Patient taking differently: 1 each by Other route daily. E11.9) 100 each 3  . B-D UF III MINI PEN NEEDLES 31G X 5 MM MISC 1 EACH BY OTHER ROUTE DAILY. E11.9 90 each 3  . baclofen (LIORESAL) 20 MG tablet Take 20 mg by mouth 3 (three) times daily.  2  . butalbital-acetaminophen-caffeine (FIORICET, ESGIC) 50-325-40 MG per tablet Take 1 tablet by mouth 2 (two) times daily as needed for headache.    . cholecalciferol (VITAMIN D) 1000 UNITS tablet Take 1,000 Units by mouth daily.    . dapagliflozin propanediol (FARXIGA) 10 MG TABS tablet Take 10 mg by mouth daily. 30 tablet 11  . fish oil-omega-3 fatty acids 1000 MG capsule Take 1 g by mouth 3 (three)  times daily.     . hydrocortisone cream 0.5 % Apply topically as needed.    Marland Kitchen ibuprofen (ADVIL) 800 MG tablet Take 800 mg by mouth every 8 (eight) hours as needed.    . insulin degludec (TRESIBA FLEXTOUCH) 200 UNIT/ML FlexTouch Pen Inject 56 Units into the skin daily. And pen needles 1/day 9 mL 11  . lacosamide (VIMPAT) 200 MG TABS Take by mouth 2 (two) times daily.      Marland Kitchen latanoprost (XALATAN) 0.005 % ophthalmic solution INSTILL 1 DROP(S) IN EACH EYE DAILY IN THE EVENING    . neomycin-polymyxin-hydrocortisone (CORTISPORIN) OTIC solution Apply 1 to 2 drops to toe BID after soaking 10 mL 0  . ofloxacin (FLOXIN) 0.3 % OTIC solution 5 drops 2 (two) times daily.    . simvastatin (ZOCOR) 40 MG tablet Take 40 mg by mouth at bedtime.    . traMADol (ULTRAM) 50 MG tablet Take 1 tablet (50 mg total) by mouth every 6 (six) hours as needed. 15 tablet 0  . warfarin (COUMADIN) 10 MG tablet Take 10 mg by mouth daily. Wed, Thurs and Fri 10 mg and all other days 7.5 mg    . zonisamide (ZONEGRAN) 100 MG capsule Take 300 mg by mouth.     No current facility-administered medications on file prior to visit.    No Known Allergies  Family History  Problem Relation Age of Onset  . Cancer Father   . Diabetes Neg Hx     BP 122/80   Pulse 96   Ht 5\' 3"  (1.6 m)   Wt 252 lb (114.3 kg)   SpO2 99%   BMI 44.64 kg/m    Review of Systems She denies hypoglycemia.      Objective:   Physical Exam VITAL SIGNS:  See vs page GENERAL: no distress.  In wheelchair Pulses: dorsalis pedis intact bilat.   MSK: no deformity of the feet CV: 1+ bilat leg edema Skin:  no ulcer on the feet.  normal color and temp on the feet.   Neuro: sensation is intact to touch on the feet.   Ext: there is bilateral onychomycosis of the toenails.    Lab Results  Component Value Date   HGBA1C 8.3 (A) 06/08/2020        Assessment & Plan:  Insulin-requiring type 2 DM, with CVA: uncontrolled.  I advised pt to increase [place].   She declines.  She also declines to give a reason for declining.    Patient Instructions  check your blood sugar twice a day.  vary the time of day when you check, between before the 3 meals, and at bedtime.  also check if you have  symptoms of your blood sugar being too high or too low.  please keep a record of the readings and bring it to your next appointment here (or you can bring the meter itself).  You can write it on any piece of paper.  please call us sooner if your blood sugar goes below 70, or if you have a lot of readings over 200.  Please continue the same [place] Please come back for a follow-up appointment in 3 months.

## 2020-06-15 DIAGNOSIS — Z5181 Encounter for therapeutic drug level monitoring: Secondary | ICD-10-CM | POA: Diagnosis not present

## 2020-06-15 DIAGNOSIS — Z7901 Long term (current) use of anticoagulants: Secondary | ICD-10-CM | POA: Diagnosis not present

## 2020-06-15 DIAGNOSIS — I6330 Cerebral infarction due to thrombosis of unspecified cerebral artery: Secondary | ICD-10-CM | POA: Diagnosis not present

## 2020-06-16 DIAGNOSIS — R928 Other abnormal and inconclusive findings on diagnostic imaging of breast: Secondary | ICD-10-CM | POA: Diagnosis not present

## 2020-06-16 DIAGNOSIS — R921 Mammographic calcification found on diagnostic imaging of breast: Secondary | ICD-10-CM | POA: Diagnosis not present

## 2020-06-16 DIAGNOSIS — N6041 Mammary duct ectasia of right breast: Secondary | ICD-10-CM | POA: Diagnosis not present

## 2020-06-17 ENCOUNTER — Ambulatory Visit (INDEPENDENT_AMBULATORY_CARE_PROVIDER_SITE_OTHER): Payer: Medicare Other | Admitting: Podiatry

## 2020-06-17 ENCOUNTER — Encounter: Payer: Self-pay | Admitting: Podiatry

## 2020-06-17 ENCOUNTER — Other Ambulatory Visit: Payer: Self-pay

## 2020-06-17 DIAGNOSIS — I69354 Hemiplegia and hemiparesis following cerebral infarction affecting left non-dominant side: Secondary | ICD-10-CM

## 2020-06-17 DIAGNOSIS — M79675 Pain in left toe(s): Secondary | ICD-10-CM

## 2020-06-17 DIAGNOSIS — E1142 Type 2 diabetes mellitus with diabetic polyneuropathy: Secondary | ICD-10-CM

## 2020-06-17 DIAGNOSIS — M79674 Pain in right toe(s): Secondary | ICD-10-CM | POA: Diagnosis not present

## 2020-06-17 DIAGNOSIS — B351 Tinea unguium: Secondary | ICD-10-CM

## 2020-06-19 NOTE — Progress Notes (Signed)
Subjective:  Patient ID: Kimberly [name], female    DOB: September 12, 1960,  MRN: [id number]  59 y.o. female presents with preventative diabetic foot care and painful thick toenails that are difficult to trim. Pain interferes with ambulation. Aggravating factors include wearing enclosed shoe gear. Pain is relieved with periodic professional debridement..    Patient's blood sugar was 185 mg/dl this morning.  PCP: Trey Sailors, PA and last visit was: 06/08/2020.  Review of Systems: Negative except as noted in the HPI.  Past Medical History:  Diagnosis Date   Diabetes mellitus    Hypertension    Seizures ([place])    last seizure march 2016   Stroke [facility])    Past Surgical History:  Procedure Laterality Date   TRACHEOSTOMY CLOSURE     Patient Active Problem List   Diagnosis Date Noted   Hyperthyroidism 03/11/2019   Diabetes ([place]) 08/12/2018   Palpitations 04/16/2017   Shortness of breath 04/16/2017   Abnormal INR 01/04/2016   Encounter for therapeutic drug level monitoring 08/31/2015   Long term current use of anticoagulant therapy 08/31/2015   Spastic hemiplegia affecting nondominant side ([place]) 10/15/2012   Myalgia and myositis, unspecified 10/15/2012   Cerebral thrombosis with cerebral infarction ([place]) 07/01/2012   Localization-related (focal) (partial) epilepsy and epileptic syndromes with complex partial seizures, without mention of intractable epilepsy 07/01/2012   Extremity pain 07/01/2012   Partial epilepsy with impairment of consciousness, intractable ([place]) 07/01/2012   CVA (cerebral infarction) 02/11/2012   Contracture of left Achilles tendon 02/11/2012    Current Outpatient Medications:    ACCU-CHEK GUIDE test strip, CHECK BLOOD SUGARS TWICE A DAY (Patient taking differently: 1 each by Other route daily. E11.9), Disp: 100 strip, Rfl: 12   Accu-Chek Softclix Lancets lancets, Use to monitor glucose levels once daily; E11.9 (Patient taking differently: 1  each by Other route daily. E11.9), Disp: 100 each, Rfl: 3   B-D UF III MINI PEN NEEDLES 31G X 5 MM MISC, 1 EACH BY OTHER ROUTE DAILY. E11.9, Disp: 90 each, Rfl: 3   baclofen (LIORESAL) 20 MG tablet, Take 20 mg by mouth 3 (three) times daily., Disp: , Rfl: 2   butalbital-acetaminophen-caffeine (FIORICET, ESGIC) 50-325-40 MG per tablet, Take 1 tablet by mouth 2 (two) times daily as needed for headache., Disp: , Rfl:    cholecalciferol (VITAMIN D) 1000 UNITS tablet, Take 1,000 Units by mouth daily., Disp: , Rfl:    dapagliflozin propanediol (FARXIGA) 10 MG TABS tablet, Take 10 mg by mouth daily., Disp: 30 tablet, Rfl: 11   fish oil-omega-3 fatty acids 1000 MG capsule, Take 1 g by mouth 3 (three) times daily. , Disp: , Rfl:    hydrocortisone cream 0.5 %, Apply topically as needed., Disp: , Rfl:    ibuprofen (ADVIL) 800 MG tablet, Take 800 mg by mouth every 8 (eight) hours as needed., Disp: , Rfl:    insulin degludec (TRESIBA FLEXTOUCH) 200 UNIT/ML FlexTouch Pen, Inject 56 Units into the skin daily. And pen needles 1/day, Disp: 9 mL, Rfl: 11   lacosamide (VIMPAT) 200 MG TABS, Take by mouth 2 (two) times daily.  , Disp: , Rfl:    latanoprost (XALATAN) 0.005 % ophthalmic solution, INSTILL 1 DROP(S) IN [facility] EYE DAILY IN THE EVENING, Disp: , Rfl:    neomycin-polymyxin-hydrocortisone (CORTISPORIN) OTIC solution, Apply 1 to 2 drops to toe BID after soaking, Disp: 10 mL, Rfl: 0   ofloxacin (FLOXIN) 0.3 % OTIC solution, 5 drops 2 (two) times daily., Disp: , Rfl:  simvastatin (ZOCOR) 40 MG tablet, Take 40 mg by mouth at bedtime., Disp: , Rfl:    traMADol (ULTRAM) 50 MG tablet, Take 1 tablet (50 mg total) by mouth every 6 (six) hours as needed., Disp: 15 tablet, Rfl: 0   warfarin (COUMADIN) 10 MG tablet, Take 10 mg by mouth daily. Wed, Thurs and Fri 10 mg and all other days 7.5 mg, Disp: , Rfl:    zonisamide (ZONEGRAN) 100 MG capsule, Take 300 mg by mouth., Disp: , Rfl:  No Known  Allergies Social History   Tobacco Use  Smoking Status Never Smoker  Smokeless Tobacco Never Used    Objective:  There were no vitals filed for this visit. Constitutional Patient is a pleasant 59 y.o. African American female morbidly obese in NAD. AAO x 3.  Vascular Capillary refill time to digits immediate b/l. Palpable pedal pulses b/l LE. Pedal hair absent. Lower extremity skin temperature gradient within normal limits. No pain with calf compression b/l. No cyanosis or clubbing noted.  Neurologic Normal speech. Protective sensation decreased with 10 gram monofilament b/l. Vibratory sensation diminished b/l.  Dermatologic Pedal skin with normal turgor, texture and tone bilaterally. No open wounds bilaterally. No interdigital macerations bilaterally. Toenails 1-5 b/l elongated, discolored, dystrophic, thickened, crumbly with subungual debris and tenderness to dorsal palpation.  Orthopedic: Muscle strength 5/5 to all LE muscle groups of right lower extremity. Flaccid lower extremities noted left LEl. Utilizes motorized chair for mobility assistance.   Hemoglobin A1C Latest Ref Rng & Units 06/08/2020 04/13/2020 01/20/2020 11/19/2019 09/21/2019  HGBA1C 4.0 - 5.6 % 8.3(A) 9.7(A) 9.0(A) 8.1(A) 7.5(A)  Some recent data might be hidden   Assessment:   1. Pain due to onychomycosis of toenails of both feet   2. Spastic hemiplegia of left nondominant side as late effect of cerebral infarction ([place])   3. Diabetic peripheral neuropathy associated with type 2 diabetes mellitus ([place])    Plan:  Patient was evaluated and treated and all questions answered.  Onychomycosis with pain -Nails palliatively debridement as below. -Educated on self-care  Procedure: Nail Debridement Rationale: Pain Type of Debridement: manual, sharp debridement. Instrumentation: Nail nipper, rotary burr. Number of Nails: 10  -Examined patient. -Continue diabetic foot care principles. -Toenails 1-5 b/l were debrided in  length and girth with sterile nail nippers and dremel without iatrogenic bleeding.  -Patient to report any pedal injuries to medical professional immediately. -Patient to continue soft, supportive shoe gear daily. -Patient/POA to call should there be question/concern in the interim.  Return in about 3 months (around 09/17/2020).  Marzetta Board, DPM

## 2020-07-14 DIAGNOSIS — Z5181 Encounter for therapeutic drug level monitoring: Secondary | ICD-10-CM | POA: Diagnosis not present

## 2020-07-14 DIAGNOSIS — Z7901 Long term (current) use of anticoagulants: Secondary | ICD-10-CM | POA: Diagnosis not present

## 2020-07-14 DIAGNOSIS — I6330 Cerebral infarction due to thrombosis of unspecified cerebral artery: Secondary | ICD-10-CM | POA: Diagnosis not present

## 2020-07-25 DIAGNOSIS — I6789 Other cerebrovascular disease: Secondary | ICD-10-CM | POA: Diagnosis not present

## 2020-07-25 DIAGNOSIS — E782 Mixed hyperlipidemia: Secondary | ICD-10-CM | POA: Diagnosis not present

## 2020-07-25 DIAGNOSIS — I10 Essential (primary) hypertension: Secondary | ICD-10-CM | POA: Diagnosis not present

## 2020-07-25 DIAGNOSIS — E1165 Type 2 diabetes mellitus with hyperglycemia: Secondary | ICD-10-CM | POA: Diagnosis not present

## 2020-07-25 DIAGNOSIS — Z0001 Encounter for general adult medical examination with abnormal findings: Secondary | ICD-10-CM | POA: Diagnosis not present

## 2020-08-11 DIAGNOSIS — I679 Cerebrovascular disease, unspecified: Secondary | ICD-10-CM | POA: Diagnosis not present

## 2020-08-11 DIAGNOSIS — R569 Unspecified convulsions: Secondary | ICD-10-CM | POA: Insufficient documentation

## 2020-08-11 DIAGNOSIS — I499 Cardiac arrhythmia, unspecified: Secondary | ICD-10-CM | POA: Diagnosis not present

## 2020-08-11 DIAGNOSIS — R1900 Intra-abdominal and pelvic swelling, mass and lump, unspecified site: Secondary | ICD-10-CM | POA: Diagnosis not present

## 2020-08-11 DIAGNOSIS — K219 Gastro-esophageal reflux disease without esophagitis: Secondary | ICD-10-CM | POA: Insufficient documentation

## 2020-08-11 DIAGNOSIS — N281 Cyst of kidney, acquired: Secondary | ICD-10-CM | POA: Diagnosis not present

## 2020-08-11 DIAGNOSIS — Z8673 Personal history of transient ischemic attack (TIA), and cerebral infarction without residual deficits: Secondary | ICD-10-CM | POA: Diagnosis not present

## 2020-08-11 DIAGNOSIS — G4489 Other headache syndrome: Secondary | ICD-10-CM | POA: Diagnosis not present

## 2020-08-11 DIAGNOSIS — E1110 Type 2 diabetes mellitus with ketoacidosis without coma: Secondary | ICD-10-CM | POA: Diagnosis not present

## 2020-08-11 DIAGNOSIS — E782 Mixed hyperlipidemia: Secondary | ICD-10-CM | POA: Insufficient documentation

## 2020-08-11 DIAGNOSIS — Z7984 Long term (current) use of oral hypoglycemic drugs: Secondary | ICD-10-CM | POA: Diagnosis not present

## 2020-08-11 DIAGNOSIS — Z7901 Long term (current) use of anticoagulants: Secondary | ICD-10-CM | POA: Diagnosis not present

## 2020-08-11 DIAGNOSIS — M329 Systemic lupus erythematosus, unspecified: Secondary | ICD-10-CM | POA: Diagnosis not present

## 2020-08-11 DIAGNOSIS — Z79899 Other long term (current) drug therapy: Secondary | ICD-10-CM | POA: Diagnosis not present

## 2020-08-11 DIAGNOSIS — E1165 Type 2 diabetes mellitus with hyperglycemia: Secondary | ICD-10-CM | POA: Diagnosis not present

## 2020-08-11 DIAGNOSIS — I10 Essential (primary) hypertension: Secondary | ICD-10-CM | POA: Insufficient documentation

## 2020-08-11 DIAGNOSIS — J984 Other disorders of lung: Secondary | ICD-10-CM | POA: Diagnosis not present

## 2020-08-11 DIAGNOSIS — K828 Other specified diseases of gallbladder: Secondary | ICD-10-CM | POA: Diagnosis not present

## 2020-08-11 DIAGNOSIS — E860 Dehydration: Secondary | ICD-10-CM | POA: Diagnosis not present

## 2020-08-11 DIAGNOSIS — K1120 Sialoadenitis, unspecified: Secondary | ICD-10-CM | POA: Diagnosis not present

## 2020-08-11 DIAGNOSIS — R112 Nausea with vomiting, unspecified: Secondary | ICD-10-CM | POA: Diagnosis not present

## 2020-08-11 DIAGNOSIS — Z743 Need for continuous supervision: Secondary | ICD-10-CM | POA: Diagnosis not present

## 2020-08-11 DIAGNOSIS — R000 Tachycardia, unspecified: Secondary | ICD-10-CM | POA: Diagnosis not present

## 2020-08-11 DIAGNOSIS — R0682 Tachypnea, not elsewhere classified: Secondary | ICD-10-CM | POA: Diagnosis not present

## 2020-08-11 DIAGNOSIS — R0689 Other abnormalities of breathing: Secondary | ICD-10-CM | POA: Diagnosis not present

## 2020-08-11 DIAGNOSIS — R6889 Other general symptoms and signs: Secondary | ICD-10-CM | POA: Diagnosis not present

## 2020-08-12 DIAGNOSIS — Z8673 Personal history of transient ischemic attack (TIA), and cerebral infarction without residual deficits: Secondary | ICD-10-CM | POA: Diagnosis not present

## 2020-08-12 DIAGNOSIS — E1165 Type 2 diabetes mellitus with hyperglycemia: Secondary | ICD-10-CM | POA: Diagnosis not present

## 2020-08-12 DIAGNOSIS — E1110 Type 2 diabetes mellitus with ketoacidosis without coma: Secondary | ICD-10-CM | POA: Diagnosis not present

## 2020-08-12 DIAGNOSIS — I679 Cerebrovascular disease, unspecified: Secondary | ICD-10-CM | POA: Diagnosis not present

## 2020-08-12 DIAGNOSIS — I10 Essential (primary) hypertension: Secondary | ICD-10-CM | POA: Diagnosis not present

## 2020-08-13 DIAGNOSIS — E1165 Type 2 diabetes mellitus with hyperglycemia: Secondary | ICD-10-CM | POA: Diagnosis not present

## 2020-08-13 DIAGNOSIS — I10 Essential (primary) hypertension: Secondary | ICD-10-CM | POA: Diagnosis not present

## 2020-08-13 DIAGNOSIS — E1110 Type 2 diabetes mellitus with ketoacidosis without coma: Secondary | ICD-10-CM | POA: Diagnosis not present

## 2020-08-13 DIAGNOSIS — I679 Cerebrovascular disease, unspecified: Secondary | ICD-10-CM | POA: Diagnosis not present

## 2020-08-13 DIAGNOSIS — Z8673 Personal history of transient ischemic attack (TIA), and cerebral infarction without residual deficits: Secondary | ICD-10-CM | POA: Diagnosis not present

## 2020-08-14 DIAGNOSIS — E1165 Type 2 diabetes mellitus with hyperglycemia: Secondary | ICD-10-CM | POA: Diagnosis not present

## 2020-08-14 DIAGNOSIS — Z8673 Personal history of transient ischemic attack (TIA), and cerebral infarction without residual deficits: Secondary | ICD-10-CM | POA: Diagnosis not present

## 2020-08-14 DIAGNOSIS — Z794 Long term (current) use of insulin: Secondary | ICD-10-CM | POA: Diagnosis not present

## 2020-08-14 DIAGNOSIS — K1120 Sialoadenitis, unspecified: Secondary | ICD-10-CM | POA: Diagnosis not present

## 2020-08-16 DIAGNOSIS — Z5181 Encounter for therapeutic drug level monitoring: Secondary | ICD-10-CM | POA: Diagnosis not present

## 2020-08-16 DIAGNOSIS — R791 Abnormal coagulation profile: Secondary | ICD-10-CM | POA: Diagnosis not present

## 2020-08-16 DIAGNOSIS — Z7901 Long term (current) use of anticoagulants: Secondary | ICD-10-CM | POA: Diagnosis not present

## 2020-08-16 DIAGNOSIS — I6330 Cerebral infarction due to thrombosis of unspecified cerebral artery: Secondary | ICD-10-CM | POA: Diagnosis not present

## 2020-08-24 DIAGNOSIS — Z7901 Long term (current) use of anticoagulants: Secondary | ICD-10-CM | POA: Diagnosis not present

## 2020-08-24 DIAGNOSIS — Z5181 Encounter for therapeutic drug level monitoring: Secondary | ICD-10-CM | POA: Diagnosis not present

## 2020-08-24 DIAGNOSIS — R791 Abnormal coagulation profile: Secondary | ICD-10-CM | POA: Diagnosis not present

## 2020-08-24 DIAGNOSIS — I6330 Cerebral infarction due to thrombosis of unspecified cerebral artery: Secondary | ICD-10-CM | POA: Diagnosis not present

## 2020-08-31 DIAGNOSIS — Z7901 Long term (current) use of anticoagulants: Secondary | ICD-10-CM | POA: Diagnosis not present

## 2020-08-31 DIAGNOSIS — I6330 Cerebral infarction due to thrombosis of unspecified cerebral artery: Secondary | ICD-10-CM | POA: Diagnosis not present

## 2020-08-31 DIAGNOSIS — Z5181 Encounter for therapeutic drug level monitoring: Secondary | ICD-10-CM | POA: Diagnosis not present

## 2020-08-31 DIAGNOSIS — R791 Abnormal coagulation profile: Secondary | ICD-10-CM | POA: Diagnosis not present

## 2020-09-05 ENCOUNTER — Encounter: Payer: Self-pay | Admitting: Podiatry

## 2020-09-08 ENCOUNTER — Ambulatory Visit: Payer: Medicare Other | Admitting: Endocrinology

## 2020-09-09 DIAGNOSIS — Z7901 Long term (current) use of anticoagulants: Secondary | ICD-10-CM | POA: Diagnosis not present

## 2020-09-09 DIAGNOSIS — R791 Abnormal coagulation profile: Secondary | ICD-10-CM | POA: Diagnosis not present

## 2020-09-09 DIAGNOSIS — Z5181 Encounter for therapeutic drug level monitoring: Secondary | ICD-10-CM | POA: Diagnosis not present

## 2020-09-09 DIAGNOSIS — I6330 Cerebral infarction due to thrombosis of unspecified cerebral artery: Secondary | ICD-10-CM | POA: Diagnosis not present

## 2020-09-16 DIAGNOSIS — E1165 Type 2 diabetes mellitus with hyperglycemia: Secondary | ICD-10-CM | POA: Diagnosis not present

## 2020-09-16 DIAGNOSIS — I10 Essential (primary) hypertension: Secondary | ICD-10-CM | POA: Diagnosis not present

## 2020-09-16 DIAGNOSIS — G629 Polyneuropathy, unspecified: Secondary | ICD-10-CM | POA: Diagnosis not present

## 2020-09-16 DIAGNOSIS — R569 Unspecified convulsions: Secondary | ICD-10-CM | POA: Diagnosis not present

## 2020-09-16 DIAGNOSIS — M25521 Pain in right elbow: Secondary | ICD-10-CM | POA: Diagnosis not present

## 2020-09-16 DIAGNOSIS — I6789 Other cerebrovascular disease: Secondary | ICD-10-CM | POA: Diagnosis not present

## 2020-09-16 DIAGNOSIS — I69053 Hemiplegia and hemiparesis following nontraumatic subarachnoid hemorrhage affecting right non-dominant side: Secondary | ICD-10-CM | POA: Diagnosis not present

## 2020-09-16 DIAGNOSIS — E782 Mixed hyperlipidemia: Secondary | ICD-10-CM | POA: Diagnosis not present

## 2020-09-20 ENCOUNTER — Other Ambulatory Visit: Payer: Self-pay

## 2020-09-20 ENCOUNTER — Encounter: Payer: Self-pay | Admitting: Podiatry

## 2020-09-20 ENCOUNTER — Ambulatory Visit (INDEPENDENT_AMBULATORY_CARE_PROVIDER_SITE_OTHER): Payer: Medicare Other | Admitting: Podiatry

## 2020-09-20 DIAGNOSIS — M810 Age-related osteoporosis without current pathological fracture: Secondary | ICD-10-CM | POA: Insufficient documentation

## 2020-09-20 DIAGNOSIS — G629 Polyneuropathy, unspecified: Secondary | ICD-10-CM | POA: Insufficient documentation

## 2020-09-20 DIAGNOSIS — M79675 Pain in left toe(s): Secondary | ICD-10-CM

## 2020-09-20 DIAGNOSIS — E1142 Type 2 diabetes mellitus with diabetic polyneuropathy: Secondary | ICD-10-CM

## 2020-09-20 DIAGNOSIS — M79674 Pain in right toe(s): Secondary | ICD-10-CM

## 2020-09-20 DIAGNOSIS — B351 Tinea unguium: Secondary | ICD-10-CM | POA: Diagnosis not present

## 2020-09-20 DIAGNOSIS — M24575 Contracture, left foot: Secondary | ICD-10-CM | POA: Insufficient documentation

## 2020-09-20 DIAGNOSIS — M21372 Foot drop, left foot: Secondary | ICD-10-CM

## 2020-09-23 DIAGNOSIS — R791 Abnormal coagulation profile: Secondary | ICD-10-CM | POA: Diagnosis not present

## 2020-09-23 DIAGNOSIS — I6330 Cerebral infarction due to thrombosis of unspecified cerebral artery: Secondary | ICD-10-CM | POA: Diagnosis not present

## 2020-09-23 DIAGNOSIS — Z5181 Encounter for therapeutic drug level monitoring: Secondary | ICD-10-CM | POA: Diagnosis not present

## 2020-09-23 DIAGNOSIS — Z7901 Long term (current) use of anticoagulants: Secondary | ICD-10-CM | POA: Diagnosis not present

## 2020-09-24 NOTE — Progress Notes (Signed)
Subjective:  Patient ID: Kimberly [name], female    DOB: August 23, 1961,  MRN: [id number]  60 y.o. female presents with preventative diabetic foot care and painful thick toenails that are difficult to trim. Pain interferes with ambulation. Aggravating factors include wearing enclosed shoe gear. Pain is relieved with periodic professional debridement.   Her husband is present during today's visit.  Patient states her right great toe is tender on today's visit.   Trey Sailors, [place] and last visit was: 06/08/2020.  Review of Systems: Negative except as noted in the HPI.  Past Medical History:  Diagnosis Date  . Diabetes mellitus   . Hypertension   . Seizures ([place])    last seizure march 2016  . Stroke [facility])    Past Surgical History:  Procedure Laterality Date  . TRACHEOSTOMY CLOSURE     Patient Active Problem List   Diagnosis Date Noted  . Contracture, left foot 09/20/2020  . Neuropathy 09/20/2020  . Osteoporosis 09/20/2020  . Diabetic ketoacidosis without coma associated with type 2 diabetes mellitus ([place]) 08/11/2020  . Seizure ([place]) 08/11/2020  . Hyperthyroidism 03/11/2019  . Diabetes ([place]) 08/12/2018  . Palpitations 04/16/2017  . Shortness of breath 04/16/2017  . Abnormal INR 01/04/2016  . Encounter for therapeutic drug level monitoring 08/31/2015  . Long term current use of anticoagulant therapy 08/31/2015  . Spastic hemiplegia affecting nondominant side ([place]) 10/15/2012  . Myalgia and myositis, unspecified 10/15/2012  . Cerebral thrombosis with cerebral infarction ([place]) 07/01/2012  . Localization-related (focal) (partial) epilepsy and epileptic syndromes with complex partial seizures, without mention of intractable epilepsy 07/01/2012  . Extremity pain 07/01/2012  . Partial epilepsy with impairment of consciousness, intractable ([place]) 07/01/2012  . CVA (cerebral infarction) 02/11/2012  . Contracture of left Achilles tendon 02/11/2012    Current Outpatient Medications:   .  ACCU-CHEK GUIDE test strip, CHECK BLOOD SUGARS TWICE A DAY (Patient taking differently: 1 each by Other route daily. E11.9), Disp: 100 strip, Rfl: 12 .  Accu-Chek Softclix Lancets lancets, Use to monitor glucose levels once daily; E11.9 (Patient taking differently: 1 each by Other route daily. E11.9), Disp: 100 each, Rfl: 3 .  B-D UF III MINI PEN NEEDLES 31G X 5 MM MISC, 1 EACH BY OTHER ROUTE DAILY. E11.9, Disp: 90 each, Rfl: 3 .  baclofen (LIORESAL) 20 MG tablet, Take 20 mg by mouth 3 (three) times daily., Disp: , Rfl: 2 .  butalbital-acetaminophen-caffeine (FIORICET, ESGIC) 50-325-40 MG per tablet, Take 1 tablet by mouth 2 (two) times daily as needed for headache., Disp: , Rfl:  .  cholecalciferol (VITAMIN D) 1000 UNITS tablet, Take 1,000 Units by mouth daily., Disp: , Rfl:  .  dapagliflozin propanediol (FARXIGA) 10 MG TABS tablet, Take 10 mg by mouth daily., Disp: 30 tablet, Rfl: 11 .  fish oil-omega-3 fatty acids 1000 MG capsule, Take 1 g by mouth 3 (three) times daily. , Disp: , Rfl:  .  hydrocortisone cream 0.5 %, Apply topically as needed., Disp: , Rfl:  .  ibuprofen (ADVIL) 800 MG tablet, Take 800 mg by mouth every 8 (eight) hours as needed., Disp: , Rfl:  .  insulin degludec (TRESIBA FLEXTOUCH) 200 UNIT/ML FlexTouch Pen, Inject 56 Units into the skin daily. And pen needles 1/day, Disp: 9 mL, Rfl: 11 .  lacosamide (VIMPAT) 200 MG TABS, Take by mouth 2 (two) times daily.  , Disp: , Rfl:  .  latanoprost (XALATAN) 0.005 % ophthalmic solution, INSTILL 1 DROP(S) IN [facility] EYE DAILY IN THE EVENING, Disp: ,  Rfl:  .  neomycin-polymyxin-hydrocortisone (CORTISPORIN) OTIC solution, Apply 1 to 2 drops to toe BID after soaking, Disp: 10 mL, Rfl: 0 .  ofloxacin (FLOXIN) 0.3 % OTIC solution, 5 drops 2 (two) times daily., Disp: , Rfl:  .  simvastatin (ZOCOR) 40 MG tablet, Take 40 mg by mouth at bedtime., Disp: , Rfl:  .  traMADol (ULTRAM) 50 MG tablet, Take 1 tablet (50 mg total) by mouth every 6 (six)  hours as needed., Disp: 15 tablet, Rfl: 0 .  warfarin (COUMADIN) 10 MG tablet, Take 10 mg by mouth daily. Wed, Thurs and Fri 10 mg and all other days 7.5 mg, Disp: , Rfl:  .  zonisamide (ZONEGRAN) 100 MG capsule, Take 300 mg by mouth., Disp: , Rfl:  No Known Allergies Social History   Tobacco Use  Smoking Status Never Smoker  Smokeless Tobacco Never Used    Objective:  There were no vitals filed for this visit. Constitutional Patient is a pleasant 60 y.o. African American female morbidly obese in NAD. AAO x 3.  Vascular Capillary refill time to digits immediate b/l. Palpable pedal pulses b/l LE. Pedal hair absent. Lower extremity skin temperature gradient within normal limits. No pain with calf compression b/l. No cyanosis or clubbing noted.  Neurologic Normal speech. Protective sensation decreased with 10 gram monofilament b/l. Vibratory sensation diminished b/l.  Dermatologic Pedal skin with normal turgor, texture and tone bilaterally. No open wounds bilaterally. No interdigital macerations bilaterally. Toenails 1-5 b/l elongated, discolored, dystrophic, thickened, crumbly with subungual debris and tenderness to dorsal palpation. Incurvated nailplate right great toe lateral  border(s) with tenderness to palpation. No erythema, no edema, no drainage noted.  Orthopedic: Muscle strength 5/5 to all LE muscle groups of right lower extremity. Flaccid lower extremities noted left LLE. Utilizes motorized chair for mobility assistance.   Hemoglobin A1C Latest Ref Rng & Units 06/08/2020 04/13/2020 01/20/2020 11/19/2019  HGBA1C 4.0 - 5.6 % 8.3(A) 9.7(A) 9.0(A) 8.1(A)  Some recent data might be hidden   Assessment:   1. Pain due to onychomycosis of toenails of both feet   2. Left foot drop   3. Diabetic peripheral neuropathy associated with type 2 diabetes mellitus ([place])    Plan:  Patient was evaluated and treated and all questions answered.  Onychomycosis with pain -Nails palliatively  debridement as below. -Educated on self-care  Procedure: Nail Debridement Rationale: Pain Type of Debridement: manual, sharp debridement. Instrumentation: Nail nipper, rotary burr. Number of Nails: 10  -Examined patient. -Continue diabetic foot care principles. -Patient to continue soft, supportive shoe gear daily. Start procedure for diabetic shoes. Patient qualifies based on diagnoses. -Toenails 1-5 b/l were debrided in length and girth with sterile nail nippers and dremel without iatrogenic bleeding.  -Offending nail border debrided and curretaged R hallux utilizing sterile nail nipper and currette. Border cleansed with alcohol and triple antibiotic applied. No further treatment required by patient/caregiver. -Patient to report any pedal injuries to medical professional immediately. -Patient/POA to call should there be question/concern in the interim.  Return in about 3 months (around 12/19/2020).  Marzetta Board, DPM

## 2020-09-30 DIAGNOSIS — I6990 Unspecified sequelae of unspecified cerebrovascular disease: Secondary | ICD-10-CM | POA: Diagnosis not present

## 2020-10-04 ENCOUNTER — Other Ambulatory Visit: Payer: Self-pay | Admitting: Endocrinology

## 2020-10-10 DIAGNOSIS — E118 Type 2 diabetes mellitus with unspecified complications: Secondary | ICD-10-CM | POA: Diagnosis not present

## 2020-10-10 DIAGNOSIS — H401112 Primary open-angle glaucoma, right eye, moderate stage: Secondary | ICD-10-CM | POA: Diagnosis not present

## 2020-10-10 DIAGNOSIS — H35033 Hypertensive retinopathy, bilateral: Secondary | ICD-10-CM | POA: Diagnosis not present

## 2020-10-10 DIAGNOSIS — H401121 Primary open-angle glaucoma, left eye, mild stage: Secondary | ICD-10-CM | POA: Diagnosis not present

## 2020-10-20 DIAGNOSIS — I6789 Other cerebrovascular disease: Secondary | ICD-10-CM | POA: Diagnosis not present

## 2020-10-20 DIAGNOSIS — I10 Essential (primary) hypertension: Secondary | ICD-10-CM | POA: Diagnosis not present

## 2020-10-20 DIAGNOSIS — E782 Mixed hyperlipidemia: Secondary | ICD-10-CM | POA: Diagnosis not present

## 2020-10-20 DIAGNOSIS — M25521 Pain in right elbow: Secondary | ICD-10-CM | POA: Diagnosis not present

## 2020-10-20 DIAGNOSIS — E1165 Type 2 diabetes mellitus with hyperglycemia: Secondary | ICD-10-CM | POA: Diagnosis not present

## 2020-10-20 DIAGNOSIS — I69053 Hemiplegia and hemiparesis following nontraumatic subarachnoid hemorrhage affecting right non-dominant side: Secondary | ICD-10-CM | POA: Diagnosis not present

## 2020-10-20 DIAGNOSIS — R569 Unspecified convulsions: Secondary | ICD-10-CM | POA: Diagnosis not present

## 2020-10-21 DIAGNOSIS — I6330 Cerebral infarction due to thrombosis of unspecified cerebral artery: Secondary | ICD-10-CM | POA: Diagnosis not present

## 2020-10-21 DIAGNOSIS — Z5181 Encounter for therapeutic drug level monitoring: Secondary | ICD-10-CM | POA: Diagnosis not present

## 2020-10-21 DIAGNOSIS — Z7901 Long term (current) use of anticoagulants: Secondary | ICD-10-CM | POA: Diagnosis not present

## 2020-11-15 ENCOUNTER — Ambulatory Visit (INDEPENDENT_AMBULATORY_CARE_PROVIDER_SITE_OTHER): Payer: Medicare Other | Admitting: Endocrinology

## 2020-11-15 ENCOUNTER — Other Ambulatory Visit: Payer: Self-pay

## 2020-11-15 VITALS — BP 120/80 | HR 99 | Ht 63.0 in | Wt 248.7 lb

## 2020-11-15 DIAGNOSIS — Z794 Long term (current) use of insulin: Secondary | ICD-10-CM

## 2020-11-15 DIAGNOSIS — E1149 Type 2 diabetes mellitus with other diabetic neurological complication: Secondary | ICD-10-CM

## 2020-11-15 LAB — T4, FREE: Free T4: 0.91 ng/dL (ref 0.60–1.60)

## 2020-11-15 LAB — POCT GLYCOSYLATED HEMOGLOBIN (HGB A1C): Hemoglobin A1C: 9.1 % — AB (ref 4.0–5.6)

## 2020-11-15 LAB — TSH: TSH: 1.31 u[IU]/mL (ref 0.35–4.50)

## 2020-11-15 NOTE — Progress Notes (Signed)
Subjective:    Patient ID: Kimberly [name], female    DOB: 1960/09/27, 60 y.o.   MRN: [id number]  HPI Pt returns for f/u of DM: DM type: Insulin-requiring type 2.   Dx'ed: 1025 Complications: CVA Therapy: Insulin since 2019, and Farxiga.   GDM: never DKA: never Severe hypoglycemia: never.  Pancreatitis: never Pancreatic imaging: never.   Other: she also took insulin 2011-2017; she declines multiple daily injections; she stopped lantus due to n/v; she tolerates tresiba well; edema limits rx options; she did not tolerate metformin (nausea), Rybelsus (diarrhea), or Trulicity (nausea); fructosamine has confirmed A1c.    Interval history: Meter is downloaded today, and the printout is scanned into the record.  cbg's vary from 111-156.  There is no trend throughout the day.  Pt says she takes meds as rx'ed.   Pt also has hyperthyroidism (dx'ed 2020; pt says she also had hyperthyroidism in 2010; it resolved; she was rx'ed tapazole in 2020 for recurrence; she has never had dedicated thyroid imaging; she chose tapazole rx; this was stopped 1/21, due to elev TSH).   Past Medical History:  Diagnosis Date  . Diabetes mellitus   . Hypertension   . Seizures ([place])    last seizure march 2016  . Stroke [facility])     Past Surgical History:  Procedure Laterality Date  . TRACHEOSTOMY CLOSURE      Social History   Socioeconomic History  . Marital status: Married    Spouse name: Not on file  . Number of children: Not on file  . Years of education: Not on file  . Highest education level: Not on file  Occupational History  . Not on file  Tobacco Use  . Smoking status: Never Smoker  . Smokeless tobacco: Never Used  Substance and Sexual Activity  . Alcohol use: No  . Drug use: No  . Sexual activity: Not on file  Other Topics Concern  . Not on file  Social History Narrative  . Not on file   Social Determinants of Health   Financial Resource Strain: Not on file  Food Insecurity: Not on file   Transportation Needs: Not on file  Physical Activity: Not on file  Stress: Not on file  Social Connections: Not on file  Intimate Partner Violence: Not on file    Current Outpatient Medications on File Prior to Visit  Medication Sig Dispense Refill  . ACCU-CHEK GUIDE test strip CHECK BLOOD SUGARS TWICE A DAY (Patient taking differently: 1 each by Other route daily. E11.9) 100 strip 12  . Accu-Chek Softclix Lancets lancets Use to monitor glucose levels once daily; E11.9 (Patient taking differently: 1 each by Other route daily. E11.9) 100 each 3  . B-D UF III MINI PEN NEEDLES 31G X 5 MM MISC 1 EACH BY OTHER ROUTE DAILY. E11.9 90 each 3  . butalbital-acetaminophen-caffeine (FIORICET, ESGIC) 50-325-40 MG per tablet Take 1 tablet by mouth 2 (two) times daily as needed for headache.    . cholecalciferol (VITAMIN D) 1000 UNITS tablet Take 1,000 Units by mouth daily.    Marland Kitchen FARXIGA 10 MG TABS tablet TAKE 1 TABLET EVERY DAY 30 tablet 1  . fish oil-omega-3 fatty acids 1000 MG capsule Take 1 g by mouth 3 (three) times daily.     . hydrocortisone cream 0.5 % Apply topically as needed.    Marland Kitchen ibuprofen (ADVIL) 800 MG tablet Take 800 mg by mouth every 8 (eight) hours as needed.    . insulin degludec (TRESIBA FLEXTOUCH)  200 UNIT/ML FlexTouch Pen Inject 56 Units into the skin daily. And pen needles 1/day 9 mL 11  . lacosamide (VIMPAT) 200 MG TABS Take by mouth 2 (two) times daily.    Marland Kitchen latanoprost (XALATAN) 0.005 % ophthalmic solution INSTILL 1 DROP(S) IN EACH EYE DAILY IN THE EVENING    . neomycin-polymyxin-hydrocortisone (CORTISPORIN) OTIC solution Apply 1 to 2 drops to toe BID after soaking 10 mL 0  . ofloxacin (FLOXIN) 0.3 % OTIC solution 5 drops 2 (two) times daily.    . traMADol (ULTRAM) 50 MG tablet Take 1 tablet (50 mg total) by mouth every 6 (six) hours as needed. 15 tablet 0  . warfarin (COUMADIN) 10 MG tablet Take 10 mg by mouth daily. Wed, Thurs and Fri 10 mg and all other days 7.5 mg    .  zonisamide (ZONEGRAN) 100 MG capsule Take 300 mg by mouth.    . baclofen (LIORESAL) 20 MG tablet Take 20 mg by mouth 3 (three) times daily.  2  . simvastatin (ZOCOR) 40 MG tablet Take 40 mg by mouth at bedtime.     No current facility-administered medications on file prior to visit.    No Known Allergies  Family History  Problem Relation Age of Onset  . Cancer Father   . Diabetes Neg Hx     BP 120/80 (BP Location: Right Arm, Patient Position: Sitting, Cuff Size: Large)   Pulse 99   Ht 5\' 3"  (1.6 m)   Wt 248 lb 11.2 oz (112.8 kg)   SpO2 97%   BMI 44.06 kg/m    Review of Systems She denies hypoglycemia    Objective:   Physical Exam    Lab Results  Component Value Date   CREATININE 0.57 03/11/2019   BUN 14 03/11/2019   NA 139 03/11/2019   K 4.2 03/11/2019   CL 104 03/11/2019   CO2 27 03/11/2019   Lab Results  Component Value Date   HGBA1C 9.1 (A) 11/15/2020        Assessment & Plan:  Insulin-requiring type 2 DM, with CVA: uncontrolled.  cbg's are much lower than would be suggested by A1c.   Hyperthyroidism: due for recheck.  Patient Instructions  check your blood sugar twice a day.  vary the time of day when you check, between before the 3 meals, and at bedtime.  also check if you have symptoms of your blood sugar being too high or too low.  please keep a record of the readings and bring it to your next appointment here (or you can bring the meter itself).  You can write it on any piece of paper.  please call us sooner if your blood sugar goes below 70, or if you have a lot of readings over 200.  Please continue the same [place] for now. Blood tests are requested for you today.  We'll let you know about the results.  Here is a new meter.  It uses the same strips.  Please throw out the old one.   Please come back for a follow-up appointment in 2 months.

## 2020-11-15 NOTE — Patient Instructions (Addendum)
check your blood sugar twice a day.  vary the time of day when you check, between before the 3 meals, and at bedtime.  also check if you have symptoms of your blood sugar being too high or too low.  please keep a record of the readings and bring it to your next appointment here (or you can bring the meter itself).  You can write it on any piece of paper.  please call us sooner if your blood sugar goes below 70, or if you have a lot of readings over 200.  Please continue the same [place] for now. Blood tests are requested for you today.  We'll let you know about the results.  Here is a new meter.  It uses the same strips.  Please throw out the old one.   Please come back for a follow-up appointment in 2 months.

## 2020-11-18 DIAGNOSIS — Z7901 Long term (current) use of anticoagulants: Secondary | ICD-10-CM | POA: Diagnosis not present

## 2020-11-18 DIAGNOSIS — I6330 Cerebral infarction due to thrombosis of unspecified cerebral artery: Secondary | ICD-10-CM | POA: Diagnosis not present

## 2020-11-18 DIAGNOSIS — Z5181 Encounter for therapeutic drug level monitoring: Secondary | ICD-10-CM | POA: Diagnosis not present

## 2020-11-29 ENCOUNTER — Other Ambulatory Visit: Payer: Self-pay | Admitting: Endocrinology

## 2020-11-29 ENCOUNTER — Other Ambulatory Visit: Payer: Self-pay | Admitting: Internal Medicine

## 2020-12-20 DIAGNOSIS — R922 Inconclusive mammogram: Secondary | ICD-10-CM | POA: Diagnosis not present

## 2020-12-20 DIAGNOSIS — R921 Mammographic calcification found on diagnostic imaging of breast: Secondary | ICD-10-CM | POA: Diagnosis not present

## 2020-12-20 DIAGNOSIS — R920 Mammographic microcalcification found on diagnostic imaging of breast: Secondary | ICD-10-CM | POA: Diagnosis not present

## 2020-12-23 DIAGNOSIS — Z7901 Long term (current) use of anticoagulants: Secondary | ICD-10-CM | POA: Diagnosis not present

## 2020-12-23 DIAGNOSIS — Z5181 Encounter for therapeutic drug level monitoring: Secondary | ICD-10-CM | POA: Diagnosis not present

## 2020-12-23 DIAGNOSIS — I6330 Cerebral infarction due to thrombosis of unspecified cerebral artery: Secondary | ICD-10-CM | POA: Diagnosis not present

## 2020-12-28 DIAGNOSIS — Z7901 Long term (current) use of anticoagulants: Secondary | ICD-10-CM | POA: Diagnosis not present

## 2020-12-28 DIAGNOSIS — I6330 Cerebral infarction due to thrombosis of unspecified cerebral artery: Secondary | ICD-10-CM | POA: Diagnosis not present

## 2020-12-28 DIAGNOSIS — Z5181 Encounter for therapeutic drug level monitoring: Secondary | ICD-10-CM | POA: Diagnosis not present

## 2021-01-02 ENCOUNTER — Other Ambulatory Visit: Payer: Self-pay

## 2021-01-02 ENCOUNTER — Ambulatory Visit (INDEPENDENT_AMBULATORY_CARE_PROVIDER_SITE_OTHER): Payer: Medicare Other | Admitting: Podiatry

## 2021-01-02 ENCOUNTER — Encounter: Payer: Self-pay | Admitting: Podiatry

## 2021-01-02 DIAGNOSIS — M79675 Pain in left toe(s): Secondary | ICD-10-CM

## 2021-01-02 DIAGNOSIS — B351 Tinea unguium: Secondary | ICD-10-CM | POA: Diagnosis not present

## 2021-01-02 DIAGNOSIS — E1142 Type 2 diabetes mellitus with diabetic polyneuropathy: Secondary | ICD-10-CM

## 2021-01-02 DIAGNOSIS — M79674 Pain in right toe(s): Secondary | ICD-10-CM

## 2021-01-02 DIAGNOSIS — M21372 Foot drop, left foot: Secondary | ICD-10-CM

## 2021-01-04 DIAGNOSIS — Z5181 Encounter for therapeutic drug level monitoring: Secondary | ICD-10-CM | POA: Diagnosis not present

## 2021-01-04 DIAGNOSIS — Z7901 Long term (current) use of anticoagulants: Secondary | ICD-10-CM | POA: Diagnosis not present

## 2021-01-04 DIAGNOSIS — I6330 Cerebral infarction due to thrombosis of unspecified cerebral artery: Secondary | ICD-10-CM | POA: Diagnosis not present

## 2021-01-04 DIAGNOSIS — R791 Abnormal coagulation profile: Secondary | ICD-10-CM | POA: Diagnosis not present

## 2021-01-05 DIAGNOSIS — N6021 Fibroadenosis of right breast: Secondary | ICD-10-CM | POA: Diagnosis not present

## 2021-01-05 DIAGNOSIS — N6011 Diffuse cystic mastopathy of right breast: Secondary | ICD-10-CM | POA: Diagnosis not present

## 2021-01-05 DIAGNOSIS — R921 Mammographic calcification found on diagnostic imaging of breast: Secondary | ICD-10-CM | POA: Diagnosis not present

## 2021-01-08 NOTE — Progress Notes (Signed)
  Subjective:  Patient ID: Kimberly [name], female    DOB: Sep 23, 1960,  MRN: [id number]  60 y.o. female presents preventative diabetic foot care and painful thick toenails that are difficult to trim. Pain interferes with ambulation. Aggravating factors include wearing enclosed shoe gear. Pain is relieved with periodic professional debridement.   Patient is accompanied by her husband and caregiver, Domonique, on today's visit. Patient states she has to have a breast biopsy scheduled for calcifications found on her mammogram.  Blood glucose was 81 mg/dl this morning. Last A1c was 9.1%.  She and her husband will be traveling for their upcoming wedding anniversary.  PCP is Raelyn Number, P.A., and last visit was 07/25/2020.  She is inquiring about obtaining new diabetic shoes due to her current pair being worn.   No Known Allergies  Review of Systems: Negative except as noted in the HPI.   Objective:   Constitutional Pt is a pleasant 60 y.o. African American female morbidly obese in NAD. AAO x 3.   Vascular Neurovascular status unchanged b/l lower extremities. Capillary refill time to digits immediate b/l. Palpable pedal pulses b/l LE. Pedal hair absent. Lower extremity skin temperature gradient within normal limits. No pain with calf compression b/l.  Neurologic Protective sensation decreased with 10 gram monofilament b/l. Vibratory sensation diminished b/l.  Dermatologic Pedal skin with normal turgor, texture and tone bilaterally. No open wounds bilaterally. No interdigital macerations bilaterally. Toenails 1-5 b/l elongated, discolored, dystrophic, thickened, crumbly with subungual debris and tenderness to dorsal palpation. Cracked nailplate right hallux medial border. No erythema, no edema, no drainage, no fluctuance.  Orthopedic: Muscle strength 5/5 to all LE muscle groups of right lower extremity. Flaccid lower extremity noted LLE. Utilizes motorized chair for mobility assistance.    Radiographs: None Assessment:   1. Pain due to onychomycosis of toenails of both feet   2. Left foot drop   3. Diabetic peripheral neuropathy associated with type 2 diabetes mellitus ([place])    Plan:  Patient was evaluated and treated and all questions answered.  Onychomycosis with pain -Nails palliatively debridement as below. -Educated on self-care  Procedure: Nail Debridement Rationale: Pain Type of Debridement: manual, sharp debridement. Instrumentation: Nail nipper, rotary burr. Number of Nails: 10  -Examined patient. -Patient to continue soft, supportive shoe gear daily. -Patient to schedule appointment with Pedorthist for diabetic shoe measurements. -Toenails 1-5 b/l were debrided in length and girth with sterile nail nippers and dremel without iatrogenic bleeding.  -Patient to report any pedal injuries to medical professional immediately. -Patient/POA to call should there be question/concern in the interim.  Return in about 3 months (around 04/04/2021).  Marzetta Board, DPM

## 2021-01-11 DIAGNOSIS — Z5181 Encounter for therapeutic drug level monitoring: Secondary | ICD-10-CM | POA: Diagnosis not present

## 2021-01-11 DIAGNOSIS — R791 Abnormal coagulation profile: Secondary | ICD-10-CM | POA: Diagnosis not present

## 2021-01-11 DIAGNOSIS — I6330 Cerebral infarction due to thrombosis of unspecified cerebral artery: Secondary | ICD-10-CM | POA: Diagnosis not present

## 2021-01-11 DIAGNOSIS — Z7901 Long term (current) use of anticoagulants: Secondary | ICD-10-CM | POA: Diagnosis not present

## 2021-01-12 ENCOUNTER — Encounter: Payer: Self-pay | Admitting: Endocrinology

## 2021-01-12 ENCOUNTER — Other Ambulatory Visit: Payer: Self-pay

## 2021-01-12 ENCOUNTER — Ambulatory Visit (INDEPENDENT_AMBULATORY_CARE_PROVIDER_SITE_OTHER): Payer: Medicare Other | Admitting: Endocrinology

## 2021-01-12 VITALS — BP 130/84 | HR 97 | Ht 63.0 in | Wt 249.0 lb

## 2021-01-12 DIAGNOSIS — E0590 Thyrotoxicosis, unspecified without thyrotoxic crisis or storm: Secondary | ICD-10-CM | POA: Diagnosis not present

## 2021-01-12 LAB — T4, FREE: Free T4: 0.95 ng/dL (ref 0.60–1.60)

## 2021-01-12 LAB — TSH: TSH: 1.05 u[IU]/mL (ref 0.35–4.50)

## 2021-01-12 NOTE — Patient Instructions (Signed)
check your blood sugar twice a day.  vary the time of day when you check, between before the 3 meals, and at bedtime.  also check if you have symptoms of your blood sugar being too high or too low.  please keep a record of the readings and bring it to your next appointment here (or you can bring the meter itself).  You can write it on any piece of paper.  please call us sooner if your blood sugar goes below 70, or if you have a lot of readings over 200.  Please continue the same [place] for now.  Blood tests are requested for you today.  We'll let you know about the results.  Please come back for a follow-up appointment in 2 months.

## 2021-01-12 NOTE — Progress Notes (Signed)
Subjective:    Patient ID: Kimberly [name], female    DOB: 1961-01-24, 60 y.o.   MRN: [id number]  HPI Pt returns for f/u of DM: DM type: Insulin-requiring type 2.   Dx'ed: 0865 Complications: CVA Therapy: Insulin since 2019, and Farxiga.   GDM: never DKA: never Severe hypoglycemia: never.  Pancreatitis: never Pancreatic imaging: never.   Other: she also took insulin 2011-2017; she declines multiple daily injections; she stopped lantus due to n/v; she tolerates tresiba well; edema limits rx options; she did not tolerate metformin (nausea), Rybelsus (diarrhea), or Trulicity (nausea); fructosamine has confirmed A1c.    Interval history: Pt says she takes meds as rx'ed.  She says cbg's are well-controlled Pt also has hyperthyroidism (dx'ed 2020; pt says she also had hyperthyroidism in 2010; it resolved; she was rx'ed tapazole in 2020 for recurrence; she has never had dedicated thyroid imaging; she chose tapazole rx; this was stopped 1/21, due to elev TSH).  pt states she feels well in general.    Past Medical History:  Diagnosis Date  . Diabetes mellitus   . Hypertension   . Seizures ([place])    last seizure march 2016  . Stroke [facility])     Past Surgical History:  Procedure Laterality Date  . TRACHEOSTOMY CLOSURE      Social History   Socioeconomic History  . Marital status: Married    Spouse name: Not on file  . Number of children: Not on file  . Years of education: Not on file  . Highest education level: Not on file  Occupational History  . Not on file  Tobacco Use  . Smoking status: Never Smoker  . Smokeless tobacco: Never Used  Substance and Sexual Activity  . Alcohol use: No  . Drug use: No  . Sexual activity: Not on file  Other Topics Concern  . Not on file  Social History Narrative  . Not on file   Social Determinants of Health   Financial Resource Strain: Not on file  Food Insecurity: Not on file  Transportation Needs: Not on file  Physical Activity: Not on  file  Stress: Not on file  Social Connections: Not on file  Intimate Partner Violence: Not on file    Current Outpatient Medications on File Prior to Visit  Medication Sig Dispense Refill  . ACCU-CHEK GUIDE test strip CHECK BLOOD SUGARS TWICE A DAY (Patient taking differently: 1 each by Other route daily. E11.9) 100 strip 12  . Accu-Chek Softclix Lancets lancets Use to monitor glucose levels once daily; E11.9 (Patient taking differently: 1 each by Other route daily. E11.9) 100 each 3  . amoxicillin-clavulanate (AUGMENTIN) 875-125 MG tablet Take 1 tablet by mouth 2 (two) times daily.    . B-D UF III MINI PEN NEEDLES 31G X 5 MM MISC 1 EACH BY OTHER ROUTE DAILY. E11.9 90 each 3  . butalbital-acetaminophen-caffeine (FIORICET, ESGIC) 50-325-40 MG per tablet Take 1 tablet by mouth 2 (two) times daily as needed for headache.    . cholecalciferol (VITAMIN D) 1000 UNITS tablet Take 1,000 Units by mouth daily.    . Cinnamon 500 MG capsule Take by mouth.    . Coenzyme Q10 50 MG CAPS Take by mouth.    . diclofenac (VOLTAREN) 75 MG EC tablet Take 75 mg by mouth 2 (two) times daily.    Marland Kitchen enoxaparin (LOVENOX) 150 MG/ML injection Take 150 mg subcutaneously every 24 hours except 1/2 dosage on 01/04/21 per bridge plan    . Wilder Glade  10 MG TABS tablet TAKE 1 TABLET BY MOUTH EVERY DAY 30 tablet 1  . fish oil-omega-3 fatty acids 1000 MG capsule Take 1 g by mouth 3 (three) times daily.     . hydrocortisone cream 0.5 % Apply topically as needed.    Marland Kitchen ibuprofen (ADVIL) 800 MG tablet Take 800 mg by mouth every 8 (eight) hours as needed.    . lacosamide (VIMPAT) 200 MG TABS Take by mouth 2 (two) times daily.    Marland Kitchen latanoprost (XALATAN) 0.005 % ophthalmic solution INSTILL 1 DROP(S) IN [facility] EYE DAILY IN THE EVENING    . metoprolol succinate (TOPROL-XL) 50 MG 24 hr tablet 1 tab(s)    . neomycin-polymyxin-hydrocortisone (CORTISPORIN) OTIC solution Apply 1 to 2 drops to toe BID after soaking 10 mL 0  . ofloxacin (FLOXIN) 0.3  % OTIC solution 5 drops 2 (two) times daily.    . timolol (TIMOPTIC) 0.5 % ophthalmic solution SMARTSIG:In Eye(s)    . traMADol (ULTRAM) 50 MG tablet Take 1 tablet (50 mg total) by mouth every 6 (six) hours as needed. 15 tablet 0  . TRESIBA FLEXTOUCH 200 UNIT/ML FlexTouch Pen INJECT 40 UNITS INTO THE SKIN DAILY. 15 mL 1  . warfarin (COUMADIN) 10 MG tablet Take 10 mg by mouth daily. Wed, Thurs and Fri 10 mg and all other days 7.5 mg    . zonisamide (ZONEGRAN) 100 MG capsule Take 300 mg by mouth.    . baclofen (LIORESAL) 20 MG tablet Take 20 mg by mouth 3 (three) times daily.  2  . simvastatin (ZOCOR) 40 MG tablet Take 40 mg by mouth at bedtime.     No current facility-administered medications on file prior to visit.    No Known Allergies  Family History  Problem Relation Age of Onset  . Cancer Father   . Diabetes Neg Hx     BP 130/84 (BP Location: Right Arm, Patient Position: Sitting, Cuff Size: Large)   Pulse 97   Ht 5\' 3"  (1.6 m)   Wt 249 lb (112.9 kg)   SpO2 97%   BMI 44.11 kg/m    Review of Systems She denies hypoglycemia    Objective:   Physical Exam VITAL SIGNS:  See vs page GENERAL: no distress.  In wheelchair Pulses: dorsalis pedis intact bilat.   MSK: no deformity of the feet CV: 1+ bilat leg edema Skin:  no ulcer on the feet.  normal color and temp on the feet.   Neuro: sensation is intact to touch on the feet.   Ext: there is bilateral onychomycosis of the toenails.        Assessment & Plan:  Insulin-requiring type 2 DM: apparently well-controlled Hyperthyroidism: recheck today.  Patient Instructions  check your blood sugar twice a day.  vary the time of day when you check, between before the 3 meals, and at bedtime.  also check if you have symptoms of your blood sugar being too high or too low.  please keep a record of the readings and bring it to your next appointment here (or you can bring the meter itself).  You can write it on any piece of paper.   please call us sooner if your blood sugar goes below 70, or if you have a lot of readings over 200.  Please continue the same [place] for now.  Blood tests are requested for you today.  We'll let you know about the results.  Please come back for a follow-up appointment in  2 months.

## 2021-01-13 DIAGNOSIS — Z7901 Long term (current) use of anticoagulants: Secondary | ICD-10-CM | POA: Diagnosis not present

## 2021-01-13 DIAGNOSIS — I6330 Cerebral infarction due to thrombosis of unspecified cerebral artery: Secondary | ICD-10-CM | POA: Diagnosis not present

## 2021-01-20 ENCOUNTER — Other Ambulatory Visit: Payer: Self-pay

## 2021-01-20 ENCOUNTER — Ambulatory Visit (INDEPENDENT_AMBULATORY_CARE_PROVIDER_SITE_OTHER): Payer: Medicare Other

## 2021-01-20 DIAGNOSIS — E1142 Type 2 diabetes mellitus with diabetic polyneuropathy: Secondary | ICD-10-CM

## 2021-01-24 ENCOUNTER — Other Ambulatory Visit: Payer: Self-pay | Admitting: Endocrinology

## 2021-02-03 NOTE — Progress Notes (Signed)
Patient seen today by EJ with [facility]. Patient was measured for diabetic shoes and a shoe selection was made. Patient will be call when shoes are ready for pickup.

## 2021-02-09 ENCOUNTER — Telehealth: Payer: Self-pay | Admitting: Endocrinology

## 2021-02-09 NOTE — Telephone Encounter (Signed)
Pt is wondering what her A1c was the last time it was checked pt would like a call back

## 2021-03-02 ENCOUNTER — Encounter: Payer: Self-pay | Admitting: Endocrinology

## 2021-03-10 DIAGNOSIS — Z5181 Encounter for therapeutic drug level monitoring: Secondary | ICD-10-CM | POA: Diagnosis not present

## 2021-03-10 DIAGNOSIS — Z7901 Long term (current) use of anticoagulants: Secondary | ICD-10-CM | POA: Diagnosis not present

## 2021-03-10 DIAGNOSIS — I6330 Cerebral infarction due to thrombosis of unspecified cerebral artery: Secondary | ICD-10-CM | POA: Diagnosis not present

## 2021-03-10 DIAGNOSIS — R791 Abnormal coagulation profile: Secondary | ICD-10-CM | POA: Diagnosis not present

## 2021-03-12 ENCOUNTER — Other Ambulatory Visit: Payer: Self-pay | Admitting: Endocrinology

## 2021-03-15 ENCOUNTER — Other Ambulatory Visit: Payer: Self-pay

## 2021-03-15 ENCOUNTER — Ambulatory Visit (INDEPENDENT_AMBULATORY_CARE_PROVIDER_SITE_OTHER): Payer: Medicare Other | Admitting: Endocrinology

## 2021-03-15 VITALS — BP 122/68 | HR 93

## 2021-03-15 DIAGNOSIS — Z794 Long term (current) use of insulin: Secondary | ICD-10-CM

## 2021-03-15 DIAGNOSIS — E1149 Type 2 diabetes mellitus with other diabetic neurological complication: Secondary | ICD-10-CM

## 2021-03-15 DIAGNOSIS — Z20822 Contact with and (suspected) exposure to covid-19: Secondary | ICD-10-CM | POA: Diagnosis not present

## 2021-03-15 LAB — POCT GLYCOSYLATED HEMOGLOBIN (HGB A1C): Hemoglobin A1C: 8.4 % — AB (ref 4.0–5.6)

## 2021-03-15 MED ORDER — LANCING DEVICE MISC: 1.0000 | Freq: Once | 2 refills | Status: AC

## 2021-03-15 MED ORDER — TRESIBA FLEXTOUCH 200 UNIT/ML ~~LOC~~ SOPN: 65.0000 [IU] | PEN_INJECTOR | Freq: Every day | SUBCUTANEOUS | 3 refills | Status: AC

## 2021-03-15 NOTE — Patient Instructions (Addendum)
check your blood sugar twice a day.  vary the time of day when you check, between before the 3 meals, and at bedtime.  also check if you have symptoms of your blood sugar being too high or too low.  please keep a record of the readings and bring it to your next appointment here (or you can bring the meter itself).  You can write it on any piece of paper.  please call us sooner if your blood sugar goes below 70, or if you have a lot of readings over 200.  Please continue the same [place] for now.   Please come back for a follow-up appointment in 3 months.

## 2021-03-15 NOTE — Progress Notes (Signed)
Subjective:    Patient ID: Kimberly [name], female    DOB: 1960/09/02, 60 y.o.   MRN: [id number]  HPI Pt returns for f/u of DM: DM type: Insulin-requiring type 2.   Dx'ed: 7867 Complications: CVA Therapy: Insulin since 2019, and Farxiga.   GDM: never DKA: never Severe hypoglycemia: never.  Pancreatitis: never Pancreatic imaging: never.   Other: she also took insulin 2011-2017; she declines multiple daily injections; she stopped lantus due to n/v; she tolerates tresiba well; edema limits rx options; she did not tolerate metformin (nausea), Rybelsus (diarrhea), or Trulicity (nausea); fructosamine has confirmed A1c.    Interval history: Pt says she takes meds as rx'ed.  Meter is downloaded today, and the printout is scanned into the record.  cbg's vary from 80-180.  All are checked 10AM-2PM.  She says Tyler Aas is 60/d.  Confirmed by readback.   Pt also has hyperthyroidism (dx'ed 2020; pt says she also had hyperthyroidism in 2010; it resolved; she was rx'ed tapazole in 2020 for recurrence; she has never had dedicated thyroid imaging; she chose tapazole rx; this was stopped 1/21, due to elev TSH).  pt states she feels well in general.  Past Medical History:  Diagnosis Date   Diabetes mellitus    Hypertension    Seizures ([place])    last seizure march 2016   Stroke [facility])     Past Surgical History:  Procedure Laterality Date   TRACHEOSTOMY CLOSURE      Social History   Socioeconomic History   Marital status: Married    Spouse name: Not on file   Number of children: Not on file   Years of education: Not on file   Highest education level: Not on file  Occupational History   Not on file  Tobacco Use   Smoking status: Never   Smokeless tobacco: Never  Substance and Sexual Activity   Alcohol use: No   Drug use: No   Sexual activity: Not on file  Other Topics Concern   Not on file  Social History Narrative   Not on file   Social Determinants of Health   Financial Resource  Strain: Not on file  Food Insecurity: Not on file  Transportation Needs: Not on file  Physical Activity: Not on file  Stress: Not on file  Social Connections: Not on file  Intimate Partner Violence: Not on file    Current Outpatient Medications on File Prior to Visit  Medication Sig Dispense Refill   ACCU-CHEK GUIDE test strip [place] (Patient taking differently: 1 each by Other route daily. E11.9) 100 strip 12   Accu-Chek Softclix Lancets lancets Use to monitor glucose levels once daily; E11.9 (Patient taking differently: 1 each by Other route daily. E11.9) 100 each 3   amoxicillin-clavulanate (AUGMENTIN) 875-125 MG tablet Take 1 tablet by mouth 2 (two) times daily.     B-D UF III MINI PEN NEEDLES 31G X 5 MM MISC 1 EACH BY OTHER ROUTE DAILY. E11.9 90 each 3   butalbital-acetaminophen-caffeine (FIORICET, ESGIC) 50-325-40 MG per tablet Take 1 tablet by mouth 2 (two) times daily as needed for headache.     cholecalciferol (VITAMIN D) 1000 UNITS tablet Take 1,000 Units by mouth daily.     Cinnamon 500 MG capsule Take by mouth.     Coenzyme Q10 50 MG CAPS Take by mouth.     diclofenac (VOLTAREN) 75 MG EC tablet Take 75 mg by mouth 2 (two) times daily.  enoxaparin (LOVENOX) 150 MG/ML injection Take 150 mg subcutaneously every 24 hours except 1/2 dosage on 01/04/21 per bridge plan     FARXIGA 10 MG TABS tablet TAKE 1 TABLET BY MOUTH EVERY DAY 30 tablet 3   fish oil-omega-3 fatty acids 1000 MG capsule Take 1 g by mouth 3 (three) times daily.      hydrocortisone cream 0.5 % Apply topically as needed.     ibuprofen (ADVIL) 800 MG tablet Take 800 mg by mouth every 8 (eight) hours as needed.     lacosamide (VIMPAT) 200 MG TABS Take by mouth 2 (two) times daily.     latanoprost (XALATAN) 0.005 % ophthalmic solution INSTILL 1 DROP(S) IN EACH EYE DAILY IN THE EVENING     metoprolol succinate (TOPROL-XL) 50 MG 24 hr tablet 1 tab(s)     neomycin-polymyxin-hydrocortisone  (CORTISPORIN) OTIC solution Apply 1 to 2 drops to toe BID after soaking 10 mL 0   ofloxacin (FLOXIN) 0.3 % OTIC solution 5 drops 2 (two) times daily.     timolol (TIMOPTIC) 0.5 % ophthalmic solution SMARTSIG:In Eye(s)     traMADol (ULTRAM) 50 MG tablet Take 1 tablet (50 mg total) by mouth every 6 (six) hours as needed. 15 tablet 0   warfarin (COUMADIN) 10 MG tablet Take 10 mg by mouth daily. Wed, Thurs and Fri 10 mg and all other days 7.5 mg     zonisamide (ZONEGRAN) 100 MG capsule Take 300 mg by mouth.     baclofen (LIORESAL) 20 MG tablet Take 20 mg by mouth 3 (three) times daily.  2   simvastatin (ZOCOR) 40 MG tablet Take 40 mg by mouth at bedtime.     No current facility-administered medications on file prior to visit.    No Known Allergies  Family History  Problem Relation Age of Onset   Cancer Father    Diabetes Neg Hx     BP 122/68 (BP Location: Left Arm, Patient Position: Sitting, Cuff Size: Large)   Pulse 93   SpO2 99%    Review of Systems She denies hypoglycemia    Objective:   Physical Exam Pulses: dorsalis pedis intact bilat.   MSK: no deformity of the feet CV: 1+ bilat leg edema Skin:  no ulcer on the feet.  normal color and temp on the feet.   Neuro: sensation is intact to touch on the feet.   Ext: there is bilateral onychomycosis of the toenails.    A1c=8.4%     Assessment & Plan:  Insulin-requiring type 2 DM: uncontrolled.   Patient Instructions  check your blood sugar twice a day.  vary the time of day when you check, between before the 3 meals, and at bedtime.  also check if you have symptoms of your blood sugar being too high or too low.  please keep a record of the readings and bring it to your next appointment here (or you can bring the meter itself).  You can write it on any piece of paper.  please call us sooner if your blood sugar goes below 70, or if you have a lot of readings over 200.  Please continue the same [place] for now.    Please come back for a follow-up appointment in 3 months.

## 2021-03-16 DIAGNOSIS — I10 Essential (primary) hypertension: Secondary | ICD-10-CM | POA: Diagnosis not present

## 2021-03-16 DIAGNOSIS — G8190 Hemiplegia, unspecified affecting unspecified side: Secondary | ICD-10-CM | POA: Diagnosis not present

## 2021-03-16 DIAGNOSIS — I6789 Other cerebrovascular disease: Secondary | ICD-10-CM | POA: Diagnosis not present

## 2021-04-04 ENCOUNTER — Ambulatory Visit (INDEPENDENT_AMBULATORY_CARE_PROVIDER_SITE_OTHER): Payer: Medicare Other | Admitting: Podiatry

## 2021-04-04 ENCOUNTER — Encounter: Payer: Self-pay | Admitting: Podiatry

## 2021-04-04 ENCOUNTER — Other Ambulatory Visit: Payer: Self-pay

## 2021-04-04 DIAGNOSIS — M79675 Pain in left toe(s): Secondary | ICD-10-CM

## 2021-04-04 DIAGNOSIS — E1142 Type 2 diabetes mellitus with diabetic polyneuropathy: Secondary | ICD-10-CM

## 2021-04-04 DIAGNOSIS — B351 Tinea unguium: Secondary | ICD-10-CM | POA: Diagnosis not present

## 2021-04-04 DIAGNOSIS — M79674 Pain in right toe(s): Secondary | ICD-10-CM

## 2021-04-04 DIAGNOSIS — L859 Epidermal thickening, unspecified: Secondary | ICD-10-CM | POA: Diagnosis not present

## 2021-04-05 ENCOUNTER — Other Ambulatory Visit: Payer: Self-pay | Admitting: Endocrinology

## 2021-04-05 DIAGNOSIS — Z794 Long term (current) use of insulin: Secondary | ICD-10-CM

## 2021-04-05 DIAGNOSIS — E1149 Type 2 diabetes mellitus with other diabetic neurological complication: Secondary | ICD-10-CM

## 2021-04-07 DIAGNOSIS — R791 Abnormal coagulation profile: Secondary | ICD-10-CM | POA: Diagnosis not present

## 2021-04-07 DIAGNOSIS — Z7901 Long term (current) use of anticoagulants: Secondary | ICD-10-CM | POA: Diagnosis not present

## 2021-04-07 DIAGNOSIS — Z5181 Encounter for therapeutic drug level monitoring: Secondary | ICD-10-CM | POA: Diagnosis not present

## 2021-04-07 DIAGNOSIS — I6330 Cerebral infarction due to thrombosis of unspecified cerebral artery: Secondary | ICD-10-CM | POA: Diagnosis not present

## 2021-04-09 NOTE — Progress Notes (Signed)
Subjective: Kimberly [name] is a pleasant 60 y.o. female patient seen today for preventative diabetic foot care. She has painful thick toenails that are difficult to trim. Pain interferes with ambulation. Aggravating factors include wearing enclosed shoe gear. Pain is relieved with periodic professional debridement.  Her husband is present during today's visit.  [name] relates tenderness to right great toe medial border on today. Denies any drainage from digit.   Her blood glucose was 163 mg/dl on today.  PCP is Trey Sailors, [place]. Last visit was: 02/14/2021.  No Known Allergies  Objective: Physical Exam  General: Kimberly [name] is a pleasant 60 y.o. African American female, morbidly obese in NAD. AAO x 3.   Vascular:  Capillary refill time to digits immediate b/l. Palpable DP pulse(s) b/l lower extremities Palpable PT pulse(s) b/l lower extremities Pedal hair absent. Lower extremity skin temperature gradient within normal limits. No pain with calf compression b/l. Trace edema noted b/l lower extremities.  Dermatological:  Pedal skin with normal turgor, texture and tone b/l lower extremities. No open wounds b/l lower extremities. No interdigital macerations b/l lower extremities. Toenails 1-5 b/l elongated, discolored, dystrophic, thickened, crumbly with subungual debris and tenderness to dorsal palpation. There is evidence of partial nail avulsion right hallux medial border with nail groove hyperkeratosis. No erythema, no edema, no drainage, no flucutuance.  Musculoskeletal:  Muscle strength 5/5 to all LE muscle groups of right lower extremity. Flaccid lower extremity noted left lower extremity. Wearing AFO on left lower extremity.  Neurological:  Protective sensation decreased with 10 gram monofilament b/l. Vibratory sensation diminished b/l.  Assessment and Plan:  1. Pain due to onychomycosis of toenails of both feet   2. Hyperkeratosis of skin   3. Diabetic peripheral  neuropathy associated with type 2 diabetes mellitus ([place])   -Examined patient. -Nail groove hyperkeratosis debrided utilizing sterile dermal currette with noted relief of pain symptoms without incident. -Continue diabetic foot care principles: inspect feet daily, monitor glucose as recommended by PCP and/or Endocrinologist, and follow prescribed diet per PCP, Endocrinologist and/or dietician. -Patient to continue soft, supportive shoe gear daily. -Toenails 1-5 b/l were debrided in length and girth with sterile nail nippers and dremel without iatrogenic bleeding.  -Patient to report any pedal injuries to medical professional immediately. -Patient/POA to call should there be question/concern in the interim.  Return in about 3 months (around 07/05/2021).  Marzetta Board, DPM

## 2021-04-21 DIAGNOSIS — I6330 Cerebral infarction due to thrombosis of unspecified cerebral artery: Secondary | ICD-10-CM | POA: Diagnosis not present

## 2021-04-21 DIAGNOSIS — Z7901 Long term (current) use of anticoagulants: Secondary | ICD-10-CM | POA: Diagnosis not present

## 2021-04-21 DIAGNOSIS — R791 Abnormal coagulation profile: Secondary | ICD-10-CM | POA: Diagnosis not present

## 2021-04-21 DIAGNOSIS — Z5181 Encounter for therapeutic drug level monitoring: Secondary | ICD-10-CM | POA: Diagnosis not present

## 2021-04-25 DIAGNOSIS — I10 Essential (primary) hypertension: Secondary | ICD-10-CM | POA: Diagnosis not present

## 2021-04-25 DIAGNOSIS — M25551 Pain in right hip: Secondary | ICD-10-CM | POA: Diagnosis not present

## 2021-04-25 DIAGNOSIS — E782 Mixed hyperlipidemia: Secondary | ICD-10-CM | POA: Diagnosis not present

## 2021-04-25 DIAGNOSIS — E1165 Type 2 diabetes mellitus with hyperglycemia: Secondary | ICD-10-CM | POA: Diagnosis not present

## 2021-04-25 DIAGNOSIS — I6789 Other cerebrovascular disease: Secondary | ICD-10-CM | POA: Diagnosis not present

## 2021-04-25 DIAGNOSIS — I69053 Hemiplegia and hemiparesis following nontraumatic subarachnoid hemorrhage affecting right non-dominant side: Secondary | ICD-10-CM | POA: Diagnosis not present

## 2021-04-25 DIAGNOSIS — R569 Unspecified convulsions: Secondary | ICD-10-CM | POA: Diagnosis not present

## 2021-05-05 DIAGNOSIS — Z5181 Encounter for therapeutic drug level monitoring: Secondary | ICD-10-CM | POA: Diagnosis not present

## 2021-05-05 DIAGNOSIS — Z7901 Long term (current) use of anticoagulants: Secondary | ICD-10-CM | POA: Diagnosis not present

## 2021-05-05 DIAGNOSIS — I6330 Cerebral infarction due to thrombosis of unspecified cerebral artery: Secondary | ICD-10-CM | POA: Diagnosis not present

## 2021-05-16 ENCOUNTER — Other Ambulatory Visit: Payer: Self-pay | Admitting: Endocrinology

## 2021-05-18 ENCOUNTER — Ambulatory Visit: Payer: Medicare Other | Attending: Physician Assistant | Admitting: Physical Therapy

## 2021-05-18 ENCOUNTER — Encounter (HOSPITAL_BASED_OUTPATIENT_CLINIC_OR_DEPARTMENT_OTHER): Payer: Self-pay

## 2021-05-18 ENCOUNTER — Emergency Department (HOSPITAL_BASED_OUTPATIENT_CLINIC_OR_DEPARTMENT_OTHER): Payer: Medicare Other

## 2021-05-18 ENCOUNTER — Emergency Department (HOSPITAL_BASED_OUTPATIENT_CLINIC_OR_DEPARTMENT_OTHER)
Admission: EM | Admit: 2021-05-18 | Discharge: 2021-05-19 | Disposition: A | Discharge status: Home / Self Care | Payer: Medicare Other | Attending: Emergency Medicine | Admitting: Emergency Medicine

## 2021-05-18 ENCOUNTER — Other Ambulatory Visit: Payer: Self-pay

## 2021-05-18 ENCOUNTER — Encounter: Payer: Self-pay | Admitting: Physical Therapy

## 2021-05-18 DIAGNOSIS — E119 Type 2 diabetes mellitus without complications: Secondary | ICD-10-CM | POA: Diagnosis not present

## 2021-05-18 DIAGNOSIS — Z7901 Long term (current) use of anticoagulants: Secondary | ICD-10-CM | POA: Insufficient documentation

## 2021-05-18 DIAGNOSIS — U071 COVID-19: Secondary | ICD-10-CM | POA: Insufficient documentation

## 2021-05-18 DIAGNOSIS — R791 Abnormal coagulation profile: Secondary | ICD-10-CM | POA: Diagnosis not present

## 2021-05-18 DIAGNOSIS — Z79899 Other long term (current) drug therapy: Secondary | ICD-10-CM | POA: Insufficient documentation

## 2021-05-18 DIAGNOSIS — Z8673 Personal history of transient ischemic attack (TIA), and cerebral infarction without residual deficits: Secondary | ICD-10-CM | POA: Insufficient documentation

## 2021-05-18 DIAGNOSIS — R2689 Other abnormalities of gait and mobility: Secondary | ICD-10-CM

## 2021-05-18 DIAGNOSIS — Z7902 Long term (current) use of antithrombotics/antiplatelets: Secondary | ICD-10-CM | POA: Insufficient documentation

## 2021-05-18 DIAGNOSIS — Z794 Long term (current) use of insulin: Secondary | ICD-10-CM | POA: Diagnosis not present

## 2021-05-18 DIAGNOSIS — M6281 Muscle weakness (generalized): Secondary | ICD-10-CM | POA: Insufficient documentation

## 2021-05-18 DIAGNOSIS — I10 Essential (primary) hypertension: Secondary | ICD-10-CM | POA: Insufficient documentation

## 2021-05-18 DIAGNOSIS — J811 Chronic pulmonary edema: Secondary | ICD-10-CM | POA: Diagnosis not present

## 2021-05-18 DIAGNOSIS — R0602 Shortness of breath: Secondary | ICD-10-CM | POA: Diagnosis not present

## 2021-05-18 DIAGNOSIS — I119 Hypertensive heart disease without heart failure: Secondary | ICD-10-CM | POA: Insufficient documentation

## 2021-05-18 DIAGNOSIS — I517 Cardiomegaly: Secondary | ICD-10-CM | POA: Diagnosis not present

## 2021-05-18 DIAGNOSIS — R059 Cough, unspecified: Secondary | ICD-10-CM

## 2021-05-18 DIAGNOSIS — G8194 Hemiplegia, unspecified affecting left nondominant side: Secondary | ICD-10-CM | POA: Diagnosis not present

## 2021-05-18 DIAGNOSIS — M25551 Pain in right hip: Secondary | ICD-10-CM | POA: Insufficient documentation

## 2021-05-18 LAB — COMPREHENSIVE METABOLIC PANEL
ALT: 26 U/L (ref 0–44)
AST: 23 U/L (ref 15–41)
Albumin: 4 g/dL (ref 3.5–5.0)
Alkaline Phosphatase: 70 U/L (ref 38–126)
Anion gap: 10 (ref 5–15)
BUN: 12 mg/dL (ref 6–20)
CO2: 22 mmol/L (ref 22–32)
Calcium: 9.2 mg/dL (ref 8.9–10.3)
Chloride: 104 mmol/L (ref 98–111)
Creatinine, Ser: 0.68 mg/dL (ref 0.44–1.00)
GFR, Estimated: >60 mL/min (ref >60)
Glucose, Bld: 219 mg/dL — ABNORMAL HIGH (ref 70–99)
Potassium: 3.8 mmol/L (ref 3.5–5.1)
Sodium: 136 mmol/L (ref 135–145)
Total Bilirubin: 0.3 mg/dL (ref 0.3–1.2)
Total Protein: 8.6 g/dL — ABNORMAL HIGH (ref 6.5–8.1)

## 2021-05-18 LAB — PROTIME-INR
INR: 3.1 — ABNORMAL HIGH (ref 0.8–1.2)
Prothrombin Time: 31.6 seconds — ABNORMAL HIGH (ref 11.4–15.2)

## 2021-05-18 LAB — CBC WITH DIFFERENTIAL/PLATELET
Abs Immature Granulocytes: 0.02 10*3/uL (ref 0.00–0.07)
Basophils Absolute: 0 10*3/uL (ref 0.0–0.1)
Basophils Relative: 1 %
Eosinophils Absolute: 0.2 10*3/uL (ref 0.0–0.5)
Eosinophils Relative: 3 %
HCT: 46.1 % — ABNORMAL HIGH (ref 36.0–46.0)
Hemoglobin: 15.6 g/dL — ABNORMAL HIGH (ref 12.0–15.0)
Immature Granulocytes: 0 %
Lymphocytes Relative: 35 %
Lymphs Abs: 1.9 10*3/uL (ref 0.7–4.0)
MCH: 29.1 pg (ref 26.0–34.0)
MCHC: 33.8 g/dL (ref 30.0–36.0)
MCV: 86 fL (ref 80.0–100.0)
Monocytes Absolute: 0.4 10*3/uL (ref 0.1–1.0)
Monocytes Relative: 8 %
Neutro Abs: 3 10*3/uL (ref 1.7–7.7)
Neutrophils Relative %: 53 %
Platelets: 268 10*3/uL (ref 150–400)
RBC: 5.36 MIL/uL — ABNORMAL HIGH (ref 3.87–5.11)
RDW: 15.9 % — ABNORMAL HIGH (ref 11.5–15.5)
WBC: 5.5 10*3/uL (ref 4.0–10.5)
nRBC: 0 % (ref 0.0–0.2)

## 2021-05-18 LAB — BRAIN NATRIURETIC PEPTIDE: B Natriuretic Peptide: 12.4 pg/mL (ref 0.0–100.0)

## 2021-05-18 LAB — TROPONIN I (HIGH SENSITIVITY): Troponin I (High Sensitivity): 3 ng/L (ref <18)

## 2021-05-18 MED ORDER — HYDROCOD POLST-CPM POLST ER 10-8 MG/5ML PO SUER
5.0000 mL | Freq: Once | ORAL | Status: AC
Start: 2021-05-18 — End: 2021-05-18
  Administered 2021-05-18: 5 mL via ORAL
  Filled 2021-05-18: qty 5

## 2021-05-18 MED ORDER — BENZONATATE 100 MG PO CAPS: 100.0000 mg | ORAL_CAPSULE | Freq: Three times a day (TID) | ORAL | 0 refills | Status: DC

## 2021-05-18 NOTE — ED Notes (Signed)
RT in triage for assessment

## 2021-05-18 NOTE — Discharge Instructions (Addendum)
Take the Tessalon Perles every 8 hours as needed for coughing suppression.  You can also use some lozenges over-the-counter.   There was an abnormal finding on your x-ray, you did have some cardiomegaly, this is slight enlargement of the heart muscle.  Please follow-up with your primary care doctor about this, it is likely you may need to have an echocardiogram done outpatient.  If things change or worsen return back to the ED.

## 2021-05-18 NOTE — ED Provider Notes (Signed)
[facility] EMERGENCY DEPARTMENT Provider Note   CSN: [id number] Arrival date & time: 05/18/21  1730     History Chief Complaint  Patient presents with   Cough    Kimberly [name] is a 60 y.o. female.  HPI  Patient with history of diabetes, epilepsy, CVA on warfarin, left Achilles tendon contraction presents with cough.  This started 2 or 3 days ago.  She is coughing up white sputum.  She is also feeling like there is a "rolling sensation" in the middle of her chest.  This is worsened by inspiration, inspiration also worsens her coughing.  She go home COVID test which was negative yesterday.  No known sick contacts.  She is medically compliant with the coumadin.  No history of asthma or COPD, does not smoke cigarettes.  She denies any chest pain, nausea, vomiting, headache, fever, body aches.  Past Medical History:  Diagnosis Date   Diabetes mellitus    Hypertension    Seizures ([place])    last seizure march 2016   Stroke [facility])     Patient Active Problem List   Diagnosis Date Noted   Contracture, left foot 09/20/2020   Neuropathy 09/20/2020   Osteoporosis 09/20/2020   Diabetic ketoacidosis without coma associated with type 2 diabetes mellitus ([place]) 08/11/2020   Seizure ([place]) 08/11/2020   Cerebrovascular disease 08/11/2020   Essential hypertension 08/11/2020   Gastro-esophageal reflux disease without esophagitis 08/11/2020   History of completed stroke 08/11/2020   Hyperglycemia due to type 2 diabetes mellitus ([place]) 08/11/2020   Mixed hyperlipidemia 08/11/2020   Hyperthyroidism 03/11/2019   Diabetes ([place]) 08/12/2018   Palpitations 04/16/2017   Shortness of breath 04/16/2017   Abnormal INR 01/04/2016   Encounter for therapeutic drug level monitoring 08/31/2015   Long term current use of anticoagulant therapy 08/31/2015   Spastic hemiplegia affecting nondominant side ([place]) 10/15/2012   Myalgia and myositis, unspecified 10/15/2012   Cerebral thrombosis with cerebral  infarction ([place]) 07/01/2012   Localization-related (focal) (partial) epilepsy and epileptic syndromes with complex partial seizures, without mention of intractable epilepsy 07/01/2012   Extremity pain 07/01/2012   Partial epilepsy with impairment of consciousness, intractable ([place]) 07/01/2012   CVA (cerebral infarction) 02/11/2012   Contracture of left Achilles tendon 02/11/2012    Past Surgical History:  Procedure Laterality Date   TRACHEOSTOMY CLOSURE       OB History   No obstetric history on file.     Family History  Problem Relation Age of Onset   Cancer Father    Diabetes Neg Hx     Social History   Tobacco Use   Smoking status: Never   Smokeless tobacco: Never  Substance Use Topics   Alcohol use: No   Drug use: No    Home Medications Prior to Admission medications   Medication Sig Start Date End Date Taking? Authorizing Provider  ACCU-CHEK GUIDE test strip CHECK BLOOD SUGARS TWICE A DAY 04/05/21   Renato Shin, MD  Accu-Chek Softclix Lancets lancets Use to monitor glucose levels once daily; E11.9 Patient taking differently: 1 each by Other route daily. E11.9 11/14/18   Renato Shin, MD  amoxicillin-clavulanate (AUGMENTIN) 875-125 MG tablet Take 1 tablet by mouth 2 (two) times daily. 08/14/20   [provider]  B-D UF III MINI PEN NEEDLES 31G X 5 MM MISC 1 EACH BY OTHER ROUTE DAILY. E11.9 03/05/20   Renato Shin, MD  baclofen (LIORESAL) 20 MG tablet Take 20 mg by mouth 3 (three) times daily. 07/16/18  [provider]  butalbital-acetaminophen-caffeine (FIORICET, ESGIC) 50-325-40 MG per tablet Take 1 tablet by mouth 2 (two) times daily as needed for headache.    [provider]  cholecalciferol (VITAMIN D) 1000 UNITS tablet Take 1,000 Units by mouth daily.    [provider]  Cinnamon 500 MG capsule Take by mouth.    [provider]  Coenzyme Q10 50 MG CAPS Take by mouth.    [provider]  diclofenac  (VOLTAREN) 75 MG EC tablet Take 75 mg by mouth 2 (two) times daily. 09/16/20   [provider]  enoxaparin (LOVENOX) 150 MG/ML injection Take 150 mg subcutaneously every 24 hours except 1/2 dosage on 01/04/21 per bridge plan 12/28/20   [provider]  FARXIGA 10 MG TABS tablet TAKE 1 TABLET BY MOUTH EVERY DAY 05/16/21   Renato Shin, MD  fish oil-omega-3 fatty acids 1000 MG capsule Take 1 g by mouth 3 (three) times daily.     [provider]  hydrocortisone cream 0.5 % Apply topically as needed.    [provider]  ibuprofen (ADVIL) 800 MG tablet Take 800 mg by mouth every 8 (eight) hours as needed. 02/25/20   [provider]  insulin degludec (TRESIBA FLEXTOUCH) 200 UNIT/ML FlexTouch Pen Inject 66 Units into the skin daily. 03/15/21   Renato Shin, MD  lacosamide (VIMPAT) 200 MG TABS tablet SMARTSIG:Tablet(s) By Mouth 11/29/20   [provider]  lacosamide (VIMPAT) 200 MG TABS Take by mouth 2 (two) times daily.    [provider]  Lancets Misc. (ACCU-CHEK SOFTCLIX LANCET DEV) KIT See admin instructions. 03/16/21   [provider]  latanoprost (XALATAN) 0.005 % ophthalmic solution INSTILL 1 DROP(S) IN [facility] EYE DAILY IN THE EVENING 07/16/18   [provider]  latanoprost (XALATAN) 0.005 % ophthalmic solution Apply to eye. 07/25/20   [provider]  metoprolol succinate (TOPROL-XL) 50 MG 24 hr tablet 1 tab(s)    [provider]  neomycin-polymyxin-hydrocortisone (CORTISPORIN) OTIC solution Apply 1 to 2 drops to toe BID after soaking 05/11/20   Regal, Tamala Fothergill, DPM  NON FORMULARY once a week. Ultimate eye support supplement --taking once a week    [provider]  ofloxacin (FLOXIN) 0.3 % OTIC solution 5 drops 2 (two) times daily. 03/03/20   [provider]  simvastatin (ZOCOR) 40 MG tablet Take 40 mg by mouth at bedtime. 03/17/20   [provider]  timolol (TIMOPTIC) 0.5 % ophthalmic  solution SMARTSIG:In Eye(s) 12/31/20   [provider]  traMADol (ULTRAM) 50 MG tablet Take 1 tablet (50 mg total) by mouth every 6 (six) hours as needed. 02/12/15   Fredia Sorrow, MD  warfarin (COUMADIN) 10 MG tablet Take 10 mg by mouth daily. Wed, Thurs and Fri 10 mg and all other days 7.5 mg    [provider]  zonisamide (ZONEGRAN) 100 MG capsule Take 300 mg by mouth. 02/12/14   [provider]    Allergies    Patient has no known allergies.  Review of Systems   Review of Systems  Constitutional:  Negative for chills, fatigue and fever.  HENT:  Negative for congestion and sore throat.   Respiratory:  Positive for cough, chest tightness, shortness of breath and wheezing.   Cardiovascular:  Negative for chest pain.  Genitourinary:  Negative for dysuria and flank pain.  Musculoskeletal:  Negative for back pain and myalgias.  Neurological:  Negative for syncope, weakness and headaches.   Physical Exam  Updated Vital Signs BP (!) 152/88 (BP Location: Right Arm)   Pulse 88   Temp 98.5 F (36.9 C) (Oral)   Resp 18   LMP 06/15/2013   SpO2 98%   Physical Exam Vitals and nursing note reviewed. Exam conducted with a chaperone present.  Constitutional:      Appearance: Normal appearance.  HENT:     Head: Normocephalic and atraumatic.  Eyes:     General: No scleral icterus.       Right eye: No discharge.        Left eye: No discharge.     Extraocular Movements: Extraocular movements intact.     Pupils: Pupils are equal, round, and reactive to light.  Cardiovascular:     Rate and Rhythm: Normal rate and regular rhythm.     Pulses: Normal pulses.     Heart sounds: Normal heart sounds. No murmur heard.   No friction rub. No gallop.  Pulmonary:     Effort: Pulmonary effort is normal. No respiratory distress.     Breath sounds: Normal breath sounds.     Comments: Decreased breath sounds Abdominal:     General: Abdomen is flat. Bowel sounds are normal.  There is no distension.     Palpations: Abdomen is soft.     Tenderness: There is no abdominal tenderness.  Musculoskeletal:     Comments: No edema  Skin:    General: Skin is warm and dry.     Coloration: Skin is not jaundiced.  Neurological:     Mental Status: She is alert. Mental status is at baseline.     Coordination: Coordination normal.   ED Results / Procedures / Treatments   Labs (all labs ordered are listed, but only abnormal results are displayed) Labs Reviewed - No data to display  EKG None  Radiology No results found.  Procedures Procedures   Medications Ordered in ED Medications - No data to display  ED Course  I have reviewed the triage vital signs and the nursing notes.  Pertinent labs & imaging results that were available during my care of the patient were reviewed by me and considered in my medical decision making (see chart for details).    MDM Rules/Calculators/A&P                           Patient is mildly hypertensive, otherwise vitals are stable.  She is having some pleuritic pain as well as coughing.  Unable to get her to qualify what the sensation is in her chest other than rolling, will check a troponin to evaluate for possible signs of ACS although this would be an atypical presentation.  EKG is reassuring, no tachycardia or ST elevations or depressions.  No signs of arrhythmia on the EKG.  Radiograph is notable for cardiomegaly with some pulmonary vascular congestion. BNP was normal, no edema in the legs. No hypoxia, does not appear fluid overloaded. Recommend discussing with PCP. Doubt CHF.  No leukocytosis, mild macrocytic anemia.  PT/INR is within therapeutic range. Initial trop negative, doubt ACS given no chest pain. Don't need second. Coughing improved with Tussirex.   Discussed results with patient.  She is appropriate for discharge at this time and stable.   Discussed HPI, physical exam and plan of care for this patient with attending  Madalyn Rob. The attending physician evaluated this patient as part of a shared visit and agrees with plan of care.   Final Clinical Impression(s) /  ED Diagnoses Final diagnoses:  None    Rx / DC Orders ED Discharge Orders     None        Sherrill Raring, Hershal Coria 05/18/21 2338    Lucrezia Starch, MD 05/22/21 [phone]

## 2021-05-18 NOTE — Therapy (Signed)
[place], [place], [address] Phone: [phone]   Fax:  [phone]  Physical Therapy Evaluation  Patient Details  Name: Kimberly [name] MRN: [id number] Date of Birth: 11/06/1960 Referring Provider (PT): Trey Sailors, [place]   Encounter Date: 05/18/2021   PT End of Session - 05/18/21 1326     Visit Number 1    Number of Visits 8    Date for PT Re-Evaluation 07/13/21    Authorization Type [employer] / MCD    Progress Note Due on Visit 10    PT Start Time 1215    PT Stop Time 1300    PT Time Calculation (min) 45 min    Equipment Utilized During Treatment Other (comment)   AFO on LLE   Activity Tolerance Patient tolerated treatment well    Behavior During Therapy [facility] for tasks assessed/performed             Past Medical History:  Diagnosis Date   Diabetes mellitus    Hypertension    Seizures ([place])    last seizure march 2016   Stroke [facility])     Past Surgical History:  Procedure Laterality Date   TRACHEOSTOMY CLOSURE      There were no vitals filed for this visit.    Subjective Assessment - 05/18/21 1217     Subjective Patient reports walking is causing her a lot of pain in her right hip, and she is unable to climb stairs. She reports a stroke in 2011 that affected her left side and she has been using a power wheelchair since that time. The right hip pain has begun more recently but patient is unable to remember when the hip started to bother her. She reports that she sits a lot but does try to get up and move, and she has trouble because of her hip. She uses a quad cane on the right and wears an AFO on the left.    Limitations Standing;Walking;House hold activities    Patient Stated Goals Patient reports she wants to improve walking and stair negotiation    Currently in Pain? Yes    Pain Score 0-No pain   6-7/10 when standing or walking   Pain Location Hip    Pain Orientation Right    Pain  Descriptors / Indicators Sore;Sharp;Aching    Pain Type Chronic pain    Pain Onset More than a month ago    Pain Frequency Intermittent    Aggravating Factors  Standing, walking    Pain Relieving Factors Rest                [facility] PT Assessment - 05/18/21 0001       Assessment   Medical Diagnosis R hip and gait strengthening    Referring Provider (PT) Trey Sailors, PA    Onset Date/Surgical Date --   ongoing, patient unable to specify, previous CVA in 2011   Hand Dominance Right    Prior Therapy Yes      Precautions   Precautions Fall      Restrictions   Weight Bearing Restrictions No      Balance Screen   Has the patient fallen in the past 6 months No    Has the patient had a decrease in activity level because of a fear of falling?  Yes    Is the patient reluctant to leave their home because of a fear of falling?  No  Home Environment   Living Environment Private residence    Living Arrangements Spouse/significant other    Type of [place] Access Level entry    [place]      Prior Function   Level of Independence Needs assistance with ADLs;Needs assistance with homemaking    Vocation On disability    Leisure None reported      Cognition   Overall Cognitive Status Within Functional Limits for tasks assessed      Observation/Other Assessments   Observations Patient arrives in power chair, appears in no apparent distress    Focus on Therapeutic Outcomes (FOTO)  Not appropriate      Sensation   Light Touch Impaired by gross assessment    Additional Comments LLE and LUE gross sensation impairment      Coordination   Gross Motor Movements are Fluid and Coordinated No    Coordination and Movement Description LLE coordination deficit      Functional Tests   Functional tests Step up      Step Up   Comments Patient rquires manual assist for foot placement of LLE, unable to step up, requires CGA and  heavy reliance on RUE for balance and support      ROM / Strength   AROM / PROM / Strength Strength      Strength   Strength Assessment Site Hip;Knee    Right/Left Hip Right;Left    Right Hip Flexion 4/5    Right Hip Extension 3-/5    Right Hip ABduction 3/5    Left Hip Flexion 3-/5    Left Hip ABduction 2/5    Right/Left Knee Right;Left    Right Knee Flexion 5/5    Right Knee Extension 5/5    Left Knee Flexion 3-/5    Left Knee Extension 4/5      Palpation   Palpation comment TTP generally throughout right gluteal and lumbar region      Bed Mobility   Bed Mobility Sit to Supine;Supine to Sit    Supine to Sit Moderate Assistance - Patient 50-74%    Sit to Supine Minimal Assistance - Patient > 75%      Ambulation/Gait   Ambulation/Gait Yes    Ambulation/Gait Assistance 5: Supervision    Ambulation Distance (Feet) 5 Feet    Assistive device Small based quad cane   use on right side   Gait Comments Decreased stance time on left due to LLE weakness, heavy reliance on cane for balance and support                        Objective measurements completed on examination: See above findings.       [facility] Adult PT Treatment/Exercise - 05/18/21 0001       Exercises   Exercises Knee/Hip      Knee/Hip Exercises: Standing   Hip Flexion 10 reps    Hip Flexion Limitations left only, straight leg    Hip Abduction 10 reps    Abduction Limitations left only    Hip Extension 10 reps    Extension Limitations left only      Knee/Hip Exercises: Seated   Long Arc Quad 10 reps      Knee/Hip Exercises: Supine   Bridges 5 reps    Bridges Limitations manual assist to maintain position of LLE    Straight Leg Raises 10 reps      Knee/Hip Exercises:  Sidelying   Hip ABduction 10 reps    Hip ABduction Limitations right only                     PT Education - 05/18/21 1324     Education Details Exam findings, POC, transfer to neuro PT due to previous stroke  and possible OT referral, HEP    Person(s) Educated Patient;Spouse    Methods Explanation;Demonstration;Tactile cues;Verbal cues;Handout    Comprehension Verbalized understanding;Returned demonstration;Verbal cues required;Tactile cues required;Need further instruction              PT Short Term Goals - 05/18/21 1345       PT SHORT TERM GOAL #1   Title Patient will be I with initial HEP to progress with PT    Time 4    Period Weeks    Status New    Target Date 06/15/21      PT SHORT TERM GOAL #2   Title Patient will report </= 4/10 right hip pain with standing/walking to reduce functional limitation and improve mobility    Time 4    Period Weeks    Status New    Target Date 06/15/21               PT Long Term Goals - 05/18/21 1346       PT LONG TERM GOAL #1   Title Patient will be I with final HEP to maintain progress from PT    Time 8    Period Weeks    Status New    Target Date 07/13/21      PT LONG TERM GOAL #2   Title Patient will be able to ambulate >/= 75ft using quad cane in order to improve ability to walk to bathroom in apartment    Time 8    Period Weeks    Status New    Target Date 07/13/21      PT LONG TERM GOAL #3   Title Patient will be able to negotiate >/= 4 steps using single rail without assist to improve community access    Time 8    Period Weeks    Status New    Target Date 07/13/21      PT LONG TERM GOAL #4   Title Patient will report </= 1/10 right hip pain in order to improve standing and walking ability    Time 8    Period Weeks    Status New    Target Date 07/13/21                    Plan - 05/18/21 1327     Clinical Impression Statement Patient presents to PT with report of difficulty walking and stair negotiation, and right posterior hip/low back pain. She had previous CVA in 2011 reported by patient and this has significantly affected her left side. She demonstrates significant deficits with gait using quad  cane on right with left AFO, she requires assist with bend mobility, LLE strength deficits with difficulty coordinating movement, she has no volition control of LUE other than scapular movement, right hip strength deficits and tenderness to right lower back and gluteal region. She primarily uses a power wheelchair for mobility. She was provided exercises initiate strengthening for the LEs and she would benefit from continued skilled PT to progress her strength and improve her ability to ambulate and negotiate stairs.    Personal Factors and Comorbidities Fitness;Past/Current Experience;Time since onset of injury/illness/exacerbation;Transportation;Comorbidity  3+    Comorbidities CVA, DM, BMI, epilepsy    Examination-Activity Limitations Locomotion Level;Transfers;Reach Overhead;Squat;Stairs;Stand;Carry;Bed Mobility;Bathing;Dressing    Examination-Participation Restrictions Meal Prep;Cleaning;Occupation;Community Activity;Driving;Shop;Laundry;Yard Work    [employer]chant [employer] officer Evolving/Moderate complexity    Clinical Decision Making Moderate    Rehab Potential Fair    PT Frequency 1x / week    PT Duration 8 weeks    PT Treatment/Interventions ADLs/Self Care Home Management;Aquatic Therapy;Cryotherapy;Electrical Stimulation;Iontophoresis 4mg /ml Dexamethasone;Moist Heat;Traction;Ultrasound;DME Instruction;Neuromuscular re-education;Balance training;Therapeutic exercise;Therapeutic activities;Functional mobility training;Stair training;Gait training;Patient/family education;Orthotic Fit/Training;Manual techniques;Passive range of motion;Dry needling;Joint Manipulations;Spinal Manipulations;Vasopneumatic Device;Taping    PT Next Visit Plan Review HEP and progress PRN, progress LE and core strength as able, gait training, stair training    PT Home Exercise Plan [email or web address]    Recommended Other Services Neuro PT, OT referral    Consulted and Agree with Plan of Care Patient              Patient will benefit from skilled therapeutic intervention in order to improve the following deficits and impairments:  Abnormal gait, Decreased range of motion, Difficulty walking, Impaired UE functional use, Pain, Decreased activity tolerance, Decreased balance, Impaired flexibility, Improper body mechanics, Postural dysfunction, Impaired sensation, Decreased strength, Decreased mobility  Visit Diagnosis: Other abnormalities of gait and mobility  Muscle weakness (generalized)  Pain in right hip     Problem List Patient Active Problem List   Diagnosis Date Noted   Contracture, left foot 09/20/2020   Neuropathy 09/20/2020   Osteoporosis 09/20/2020   Diabetic ketoacidosis without coma associated with type 2 diabetes mellitus ([place]) 08/11/2020   Seizure ([place]) 08/11/2020   Cerebrovascular disease 08/11/2020   Essential hypertension 08/11/2020   Gastro-esophageal reflux disease without esophagitis 08/11/2020   History of completed stroke 08/11/2020   Hyperglycemia due to type 2 diabetes mellitus ([place]) 08/11/2020   Mixed hyperlipidemia 08/11/2020   Hyperthyroidism 03/11/2019   Diabetes ([place]) 08/12/2018   Palpitations 04/16/2017   Shortness of breath 04/16/2017   Abnormal INR 01/04/2016   Encounter for therapeutic drug level monitoring 08/31/2015   Long term current use of anticoagulant therapy 08/31/2015   Spastic hemiplegia affecting nondominant side ([place]) 10/15/2012   Myalgia and myositis, unspecified 10/15/2012   Cerebral thrombosis with cerebral infarction ([place]) 07/01/2012   Localization-related (focal) (partial) epilepsy and epileptic syndromes with complex partial seizures, without mention of intractable epilepsy 07/01/2012   Extremity pain 07/01/2012   Partial epilepsy with impairment of consciousness, intractable ([place]) 07/01/2012   CVA (cerebral infarction) 02/11/2012   Contracture of left Achilles tendon 02/11/2012    Hilda Blades, PT, DPT, LAT, ATC 05/18/21   2:01 PM Phone: [phone] Fax: [facility] [address] [place], [place], [address] Phone: [phone]   Fax:  [phone]  Name: Celestine Prim MRN: [id number] Date of Birth: 11-Oct-1960

## 2021-05-18 NOTE — ED Triage Notes (Addendum)
Pt c/o flu like sx x 1 week-states she has chest tightness when she takes a deep breath/wheezing-NAD-to triage in motorized w/c-pt states she had a neg covid home test yesterday

## 2021-05-18 NOTE — Patient Instructions (Signed)
Access Code: [email or web address] URL: [email or web address] Date: 05/18/2021 Prepared by: Hilda Blades  Exercises Sidelying Hip Abduction - 2 x daily - 7 x weekly - 2 sets - 10 reps Straight Leg Raise - 2 x daily - 7 x weekly - 2 sets - 10 reps Bridge - 2 x daily - 7 x weekly - 2 sets - 10 reps Seated Long Arc Quad - 2 x daily - 7 x weekly - 2 sets - 10 reps Standing Hip Flexion with Counter Support - 2 x daily - 7 x weekly - 2 sets - 10 reps Standing Hip Abduction with Counter Support - 2 x daily - 7 x weekly - 2 sets - 10 reps Standing Hip Extension with Counter Support - 2 x daily - 7 x weekly - 2 sets - 10 reps

## 2021-05-19 ENCOUNTER — Telehealth (HOSPITAL_COMMUNITY): Payer: Self-pay

## 2021-05-19 LAB — RESP PANEL BY RT-PCR (FLU A&B, COVID) ARPGX2
Influenza A by PCR: NEGATIVE
Influenza B by PCR: NEGATIVE
SARS Coronavirus 2 by RT PCR: POSITIVE — AB

## 2021-05-25 ENCOUNTER — Ambulatory Visit: Payer: Medicare Other | Admitting: Physical Therapy

## 2021-05-28 DIAGNOSIS — Z20822 Contact with and (suspected) exposure to covid-19: Secondary | ICD-10-CM | POA: Diagnosis not present

## 2021-06-01 ENCOUNTER — Ambulatory Visit: Payer: Medicare Other

## 2021-06-02 DIAGNOSIS — I6330 Cerebral infarction due to thrombosis of unspecified cerebral artery: Secondary | ICD-10-CM | POA: Diagnosis not present

## 2021-06-02 DIAGNOSIS — Z0001 Encounter for general adult medical examination with abnormal findings: Secondary | ICD-10-CM | POA: Diagnosis not present

## 2021-06-02 DIAGNOSIS — I6789 Other cerebrovascular disease: Secondary | ICD-10-CM | POA: Diagnosis not present

## 2021-06-02 DIAGNOSIS — E1165 Type 2 diabetes mellitus with hyperglycemia: Secondary | ICD-10-CM | POA: Diagnosis not present

## 2021-06-02 DIAGNOSIS — E782 Mixed hyperlipidemia: Secondary | ICD-10-CM | POA: Diagnosis not present

## 2021-06-02 DIAGNOSIS — I69053 Hemiplegia and hemiparesis following nontraumatic subarachnoid hemorrhage affecting right non-dominant side: Secondary | ICD-10-CM | POA: Diagnosis not present

## 2021-06-02 DIAGNOSIS — Z5181 Encounter for therapeutic drug level monitoring: Secondary | ICD-10-CM | POA: Diagnosis not present

## 2021-06-02 DIAGNOSIS — Z7901 Long term (current) use of anticoagulants: Secondary | ICD-10-CM | POA: Diagnosis not present

## 2021-06-02 DIAGNOSIS — R791 Abnormal coagulation profile: Secondary | ICD-10-CM | POA: Diagnosis not present

## 2021-06-02 DIAGNOSIS — I10 Essential (primary) hypertension: Secondary | ICD-10-CM | POA: Diagnosis not present

## 2021-06-02 DIAGNOSIS — R569 Unspecified convulsions: Secondary | ICD-10-CM | POA: Diagnosis not present

## 2021-06-07 ENCOUNTER — Ambulatory Visit: Payer: Medicare Other | Attending: Physician Assistant

## 2021-06-07 ENCOUNTER — Other Ambulatory Visit: Payer: Self-pay

## 2021-06-07 DIAGNOSIS — M6281 Muscle weakness (generalized): Secondary | ICD-10-CM | POA: Insufficient documentation

## 2021-06-07 DIAGNOSIS — R2689 Other abnormalities of gait and mobility: Secondary | ICD-10-CM | POA: Diagnosis not present

## 2021-06-07 NOTE — Therapy (Signed)
[place] [address] [place], [place], [address] Phone: [phone]   Fax:  [phone]  Physical Therapy Treatment  Patient Details  Name: Kimberly [name] MRN: [id number] Date of Birth: December 05, 1960 Referring Provider (PT): Trey Sailors, [place]   Encounter Date: 06/07/2021   PT End of Session - 06/07/21 1435     Visit Number 1    Number of Visits 17    Date for PT Re-Evaluation 08/09/21    Authorization Type [employer] [facility] / MCD    Progress Note Due on Visit 10    PT Start Time 1315    PT Stop Time 1400    PT Time Calculation (min) 45 min    Equipment Utilized During Treatment Other (comment)   AFO on LLE   Activity Tolerance Patient tolerated treatment well    Behavior During Therapy [facility] for tasks assessed/performed             Past Medical History:  Diagnosis Date   Diabetes mellitus    Hypertension    Seizures ([place])    last seizure march 2016   Stroke [facility])     Past Surgical History:  Procedure Laterality Date   TRACHEOSTOMY CLOSURE      There were no vitals filed for this visit.   Subjective Assessment - 06/07/21 1321     Subjective No changes since assessment at [facility] street office, wishes ti increase LLE strength and function    Patient is accompained by: Family member    Pertinent History CVA 2011    Patient Stated Goals Patient reports she wants to improve walking and stair negotiation                               [facility] Adult PT Treatment/Exercise - 06/07/21 0001       Bed Mobility   Bed Mobility Sit to Supine;Supine to Sit    Supine to Sit Independent with assistive device    Sit to Supine Independent with assistive device      Ambulation/Gait   Ambulation/Gait Yes    Ambulation/Gait Assistance 4: Min guard    Ambulation Distance (Feet) 50 Feet    Assistive device Small based quad cane    Gait Pattern Step-to pattern;Decreased arm swing - left;Decreased  stride length;Decreased step length - left    Ambulation Surface Level;Indoor    Gait Comments slow cadence with decreased endurance      Knee/Hip Exercises: Seated   Long Arc Quad Right;10 reps    Heel Slides Left;5 reps    Heel Slides Limitations unable to perform w/o tactile assist from PT      Knee/Hip Exercises: Supine   Bridges Strengthening;Both;10 reps    Other Supine Knee/Hip Exercises L hip fallouts 2x10                 Balance Exercises - 06/07/21 0001       Balance Exercises: Standing   Stepping Strategy Anterior;UE support;10 reps;Limitations    Stepping Strategy Limitations QC support, forward stepping, 10x ea.                  PT Short Term Goals - 06/07/21 1556       PT SHORT TERM GOAL #1   Title Patient will be I with initial HEP to progress with PT    Baseline [id number]    Time 4    Period Weeks  Status New    Target Date 06/15/21      PT SHORT TERM GOAL #2   Title Patient will report </= 4/10 right hip pain with standing/walking to reduce functional limitation and improve mobility    Baseline 4/10 pain    Time 4    Period Weeks    Status New    Target Date 06/15/21      PT SHORT TERM GOAL #3   Title Patient to ambulate 118ft with SBQC and CGA across level ground    Baseline 83ftx2 with QC and CGA    Time 4    Period Weeks    Status New    Target Date 07/12/21      PT SHORT TERM GOAL #4   Title Assess BERGand set goal    Baseline TBD    Time 4    Period Weeks    Status New    Target Date 07/12/21      PT SHORT TERM GOAL #5   Title Assess gait speed and set appropriate goal    Baseline TBD    Time 4    Status New    Target Date 07/12/21               PT Long Term Goals - 06/07/21 1558       PT LONG TERM GOAL #1   Title Patient will be I with final HEP to maintain progress from PT    Baseline [id number]    Time 8    Period Weeks    Status New    Target Date 08/09/21      PT LONG TERM GOAL #2   Title  Patient will be able to ambulate 34ft using quad cane in order to improve ability to walk to bathroom in apartment, negotiating doorways and perfoming transfer to/from seated position    Baseline 43ft ambulation across open gym floor with CGA    Time 8    Period Weeks    Status New    Target Date 08/09/21      PT LONG TERM GOAL #3   Title Patient will be able to negotiate >/= 6 steps using single rail without assist to improve community access    Baseline TBD    Time 8    Period Weeks    Status New    Target Date 08/09/21      PT LONG TERM GOAL #4   Title Assess progress towards gait speed    Baseline TBD    Time 8    Period Weeks    Status New    Target Date 08/09/21      PT LONG TERM GOAL #5   Title Assess progress towards BERG    Baseline TBD    Time 8    Period Weeks    Status New    Target Date 08/09/21                   Plan - 06/07/21 1608     Clinical Impression Statement Todays session assessed patient ability to transfer and ambulate though open spaces in gym.  Patient is SBA for transfers due to L hemiparesis and is confined to L AFO.  She mobilizes with a QC but ambulation distance limited to 6ft before fatigue.  Main source of mobility is her power chair.  Attempted AROM tasks with LE but minimal volitional movement noted due to dense hemipaersis involement.  weaknees suspected in R hip  with pain most likely due to increased workload.  Patient reluctant to bear weight on LLE and has difficulty advancing RLE forward when initiating gait as she prefers to advance LLE forward when ambulating    Personal Factors and Comorbidities Fitness;Past/Current Experience;Time since onset of injury/illness/exacerbation;Transportation;Comorbidity 3+    Comorbidities CVA, DM, BMI, epilepsy    Examination-Activity Limitations Locomotion Level;Transfers;Reach Overhead;Squat;Stairs;Stand;Carry;Bed Mobility;Bathing;Dressing    Examination-Participation Restrictions Meal  Prep;Cleaning;Occupation;Community Activity;Driving;Shop;Laundry;Yard Work    [employer]chant [employer] officer Evolving/Moderate complexity    Rehab Potential Fair    PT Frequency 1x / week    PT Duration 8 weeks    PT Treatment/Interventions ADLs/Self Care Home Management;Aquatic Therapy;Cryotherapy;Electrical Stimulation;Iontophoresis 4mg /ml Dexamethasone;Moist Heat;Traction;Ultrasound;DME Instruction;Neuromuscular re-education;Balance training;Therapeutic exercise;Therapeutic activities;Functional mobility training;Stair training;Gait training;Patient/family education;Orthotic Fit/Training;Manual techniques;Passive range of motion;Dry needling;Joint Manipulations;Spinal Manipulations;Vasopneumatic Device;Taping    PT Next Visit Plan Review HEP and progress PRN, progress LE and core strength as able, gait training, stair training    PT Home Exercise Plan [email or web address]    Consulted and Agree with Plan of Care Patient             Patient will benefit from skilled therapeutic intervention in order to improve the following deficits and impairments:  Abnormal gait, Decreased range of motion, Difficulty walking, Impaired UE functional use, Pain, Decreased activity tolerance, Decreased balance, Impaired flexibility, Improper body mechanics, Postural dysfunction, Impaired sensation, Decreased strength, Decreased mobility  Visit Diagnosis: Other abnormalities of gait and mobility  Muscle weakness (generalized)  Balance disorder     Problem List Patient Active Problem List   Diagnosis Date Noted   Contracture, left foot 09/20/2020   Neuropathy 09/20/2020   Osteoporosis 09/20/2020   Diabetic ketoacidosis without coma associated with type 2 diabetes mellitus ([place]) 08/11/2020   Seizure ([place]) 08/11/2020   Cerebrovascular disease 08/11/2020   Essential hypertension 08/11/2020   Gastro-esophageal reflux disease without esophagitis 08/11/2020   History of completed stroke 08/11/2020    Hyperglycemia due to type 2 diabetes mellitus ([place]) 08/11/2020   Mixed hyperlipidemia 08/11/2020   Hyperthyroidism 03/11/2019   Diabetes ([place]) 08/12/2018   Palpitations 04/16/2017   Shortness of breath 04/16/2017   Abnormal INR 01/04/2016   Encounter for therapeutic drug level monitoring 08/31/2015   Long term current use of anticoagulant therapy 08/31/2015   Spastic hemiplegia affecting nondominant side ([place]) 10/15/2012   Myalgia and myositis, unspecified 10/15/2012   Cerebral thrombosis with cerebral infarction ([place]) 07/01/2012   Localization-related (focal) (partial) epilepsy and epileptic syndromes with complex partial seizures, without mention of intractable epilepsy 07/01/2012   Extremity pain 07/01/2012   Partial epilepsy with impairment of consciousness, intractable ([place]) 07/01/2012   CVA (cerebral infarction) 02/11/2012   Contracture of left Achilles tendon 02/11/2012    Lanice Shirts, PT 06/07/2021, 4:12 PM  [facility] [address] [place], [place], [address] Phone: [phone]   Fax:  [phone]  Name: Kimberly [name] MRN: [id number] Date of Birth: Sep 12, 1960

## 2021-06-07 NOTE — Addendum Note (Signed)
Addended by: Lanice Shirts on: 06/07/2021 05:24 PM   Modules accepted: Orders

## 2021-06-13 ENCOUNTER — Ambulatory Visit: Payer: Medicare Other

## 2021-06-14 ENCOUNTER — Ambulatory Visit: Payer: Medicare Other

## 2021-06-15 ENCOUNTER — Ambulatory Visit: Payer: Medicare Other | Admitting: Endocrinology

## 2021-06-19 ENCOUNTER — Other Ambulatory Visit: Payer: Self-pay

## 2021-06-19 ENCOUNTER — Ambulatory Visit: Payer: Medicare Other

## 2021-06-19 DIAGNOSIS — M6281 Muscle weakness (generalized): Secondary | ICD-10-CM

## 2021-06-19 DIAGNOSIS — R2689 Other abnormalities of gait and mobility: Secondary | ICD-10-CM

## 2021-06-19 NOTE — Therapy (Signed)
[facility] [address] [place], [place], [address] Phone: [phone]   Fax:  [phone]  Physical Therapy Treatment  Patient Details  Name: Kimberly [name] MRN: [id number] Date of Birth: 04-Jun-1961 Referring Provider (PT): Trey Sailors, [place]   Encounter Date: 06/19/2021   PT End of Session - 06/19/21 1544     Visit Number 2    Number of Visits 17    Date for PT Re-Evaluation 08/09/21    Authorization Type UHC MRC / MCD    Progress Note Due on Visit 10    PT Start Time 1941    PT Stop Time 1615    PT Time Calculation (min) 45 min    Equipment Utilized During Treatment Other (comment)   AFO on LLE   Activity Tolerance Patient tolerated treatment well    Behavior During Therapy [facility] for tasks assessed/performed             Past Medical History:  Diagnosis Date   Diabetes mellitus    Hypertension    Seizures ([place])    last seizure march 2016   Stroke [facility])     Past Surgical History:  Procedure Laterality Date   TRACHEOSTOMY CLOSURE      There were no vitals filed for this visit.   Subjective Assessment - 06/19/21 1535     Subjective No changes or falls to report    Patient is accompained by: Family member    Pertinent History CVA 2011    Limitations Standing;Walking;House hold activities    Patient Stated Goals Patient reports she wants to improve walking and stair negotiation    Pain Onset More than a month ago                               [facility] Adult PT Treatment/Exercise - 06/19/21 0001       Transfers   Transfers Sit to Stand;Stand to [employer] Transfers    Sit to Stand 4: Min guard;5: Supervision    Stand to Sit 4: Min guard;5: IT sales professional Transfers 4: Min guard;5: Supervision    Number of Reps 10 reps    Transfer Cueing maintain L foot back      Ambulation/Gait   Ambulation/Gait Yes    Ambulation/Gait Assistance 4: Min guard     Ambulation Distance (Feet) 60 Feet    Assistive device Small based quad cane    Gait Pattern Step-to pattern;Decreased arm swing - left;Decreased stride length;Decreased step length - left    Ambulation Surface Level;Indoor    Gait Comments slow cadence with decreased endurance with hemoparetic gait pattern, 172ft with 1 rest break needed                 Balance Exercises - 06/19/21 0001       Balance Exercises: Standing   Stepping Strategy Anterior;UE support;10 reps;Limitations;Lateral    Stepping Strategy Limitations chair used for support    Sidestepping Upper extremity support;Limitations    Sidestepping Limitations lateral steps 10x ea. side      Balance Exercises: Seated   Other Seated Exercises scooting 4" ea way                  PT Short Term Goals - 06/19/21 1655       PT SHORT TERM GOAL #1   Title Patient will be I with initial HEP to progress with  PT    Baseline [id number]    Time 4    Period Weeks    Status New    Target Date 07/12/21      PT SHORT TERM GOAL #2   Title Patient will report </= 4/10 right hip pain with standing/walking to reduce functional limitation and improve mobility    Baseline 4/10 pain    Time 4    Period Weeks    Status New    Target Date 07/13/19      PT SHORT TERM GOAL #3   Title Patient to ambulate 129ft with SBQC and CGA across level ground    Baseline 19ftx2 with QC and CGA    Time 4    Period Weeks    Status New    Target Date 07/12/21      PT SHORT TERM GOAL #4   Title Assess BERGand set goal    Baseline TBD    Time 4    Period Weeks    Status New    Target Date 07/12/21      PT SHORT TERM GOAL #5   Title Assess gait speed and set appropriate goal    Baseline TBD    Time 4    Status New    Target Date 07/12/21               PT Long Term Goals - 06/07/21 1558       PT LONG TERM GOAL #1   Title Patient will be I with final HEP to maintain progress from PT    Baseline KMJGLQ8T    Time 8     Period Weeks    Status New    Target Date 08/09/21      PT LONG TERM GOAL #2   Title Patient will be able to ambulate 33ft using quad cane in order to improve ability to walk to bathroom in apartment, negotiating doorways and perfoming transfer to/from seated position    Baseline 42ft ambulation across open gym floor with CGA    Time 8    Period Weeks    Status New    Target Date 08/09/21      PT LONG TERM GOAL #3   Title Patient will be able to negotiate >/= 6 steps using single rail without assist to improve community access    Baseline TBD    Time 8    Period Weeks    Status New    Target Date 08/09/21      PT LONG TERM GOAL #4   Title Assess progress towards gait speed    Baseline TBD    Time 8    Period Weeks    Status New    Target Date 08/09/21      PT LONG TERM GOAL #5   Title Assess progress towards BERG    Baseline TBD    Time 8    Period Weeks    Status New    Target Date 08/09/21                   Plan - 06/19/21 1651     Clinical Impression Statement Todays session focused on continued gait with QC as patient now has purchased QC.  ambulation of 122ft with 1 needed rest break using QC under SBA wit hemiparetic gait pattern.  Performed STS transfers emphasizing maintaining midline, scooted across mat table in each direction and perfomed stepping tasks with RUE support with PT bracing L  knee.  Seated reaching and lifting to facilitae fwd WS and holding midline    Personal Factors and Comorbidities Fitness;Past/Current Experience;Time since onset of injury/illness/exacerbation;Transportation;Comorbidity 3+    Comorbidities CVA, DM, BMI, epilepsy    Examination-Activity Limitations Locomotion Level;Transfers;Reach Overhead;Squat;Stairs;Stand;Carry;Bed Mobility;Bathing;Dressing    Examination-Participation Restrictions Meal Prep;Cleaning;Occupation;Community Activity;Driving;Shop;Laundry;Yard Work    Merchant navy officer Evolving/Moderate  complexity    Rehab Potential Fair    PT Frequency 1x / week    PT Duration 8 weeks    PT Treatment/Interventions ADLs/Self Care Home Management;Aquatic Therapy;Cryotherapy;Electrical Stimulation;Iontophoresis 4mg /ml Dexamethasone;Moist Heat;Traction;Ultrasound;DME Instruction;Neuromuscular re-education;Balance training;Therapeutic exercise;Therapeutic activities;Functional mobility training;Stair training;Gait training;Patient/family education;Orthotic Fit/Training;Manual techniques;Passive range of motion;Dry needling;Joint Manipulations;Spinal Manipulations;Vasopneumatic Device;Taping    PT Next Visit Plan Review HEP and progress PRN, progress LE and core strength as able, gait training, stair training, seated/standing balance    PT Home Exercise Plan [id number]    Consulted and Agree with Plan of Care Patient             Patient will benefit from skilled therapeutic intervention in order to improve the following deficits and impairments:  Abnormal gait, Decreased range of motion, Difficulty walking, Impaired UE functional use, Pain, Decreased activity tolerance, Decreased balance, Impaired flexibility, Improper body mechanics, Postural dysfunction, Impaired sensation, Decreased strength, Decreased mobility  Visit Diagnosis: Other abnormalities of gait and mobility  Muscle weakness (generalized)  Balance disorder     Problem List Patient Active Problem List   Diagnosis Date Noted   Contracture, left foot 09/20/2020   Neuropathy 09/20/2020   Osteoporosis 09/20/2020   Diabetic ketoacidosis without coma associated with type 2 diabetes mellitus ([place]) 08/11/2020   Seizure ([place]) 08/11/2020   Cerebrovascular disease 08/11/2020   Essential hypertension 08/11/2020   Gastro-esophageal reflux disease without esophagitis 08/11/2020   History of completed stroke 08/11/2020   Hyperglycemia due to type 2 diabetes mellitus ([place]) 08/11/2020   Mixed hyperlipidemia 08/11/2020   Hyperthyroidism  03/11/2019   Diabetes ([place]) 08/12/2018   Palpitations 04/16/2017   Shortness of breath 04/16/2017   Abnormal INR 01/04/2016   Encounter for therapeutic drug level monitoring 08/31/2015   Long term current use of anticoagulant therapy 08/31/2015   Spastic hemiplegia affecting nondominant side ([place]) 10/15/2012   Myalgia and myositis, unspecified 10/15/2012   Cerebral thrombosis with cerebral infarction ([place]) 07/01/2012   Localization-related (focal) (partial) epilepsy and epileptic syndromes with complex partial seizures, without mention of intractable epilepsy 07/01/2012   Extremity pain 07/01/2012   Partial epilepsy with impairment of consciousness, intractable ([place]) 07/01/2012   CVA (cerebral infarction) 02/11/2012   Contracture of left Achilles tendon 02/11/2012    Lanice Shirts, PT 06/19/2021, 4:57 PM  [facility] [address] [place], [place], [address] Phone: [phone]   Fax:  [phone]  Name: Kimberly [name] MRN: [id number] Date of Birth: 1961/03/24

## 2021-06-20 DIAGNOSIS — Z7901 Long term (current) use of anticoagulants: Secondary | ICD-10-CM | POA: Diagnosis not present

## 2021-06-20 DIAGNOSIS — I6330 Cerebral infarction due to thrombosis of unspecified cerebral artery: Secondary | ICD-10-CM | POA: Diagnosis not present

## 2021-06-20 DIAGNOSIS — Z5181 Encounter for therapeutic drug level monitoring: Secondary | ICD-10-CM | POA: Diagnosis not present

## 2021-06-21 ENCOUNTER — Ambulatory Visit: Payer: Medicare Other | Admitting: Physical Therapy

## 2021-06-21 ENCOUNTER — Encounter: Payer: Self-pay | Admitting: Physical Therapy

## 2021-06-21 ENCOUNTER — Other Ambulatory Visit: Payer: Self-pay

## 2021-06-21 DIAGNOSIS — M6281 Muscle weakness (generalized): Secondary | ICD-10-CM

## 2021-06-21 DIAGNOSIS — R2689 Other abnormalities of gait and mobility: Secondary | ICD-10-CM | POA: Diagnosis not present

## 2021-06-22 NOTE — Therapy (Signed)
[facility] [address] [place], [place], [address] Phone: [phone]   Fax:  [phone]  Physical Therapy Treatment  Patient Details  Name: Kimberly [name] MRN: [id number] Date of Birth: Oct 03, 1960 Referring Provider (PT): Trey Sailors, [place]   Encounter Date: 06/21/2021   PT End of Session - 06/21/21 1537     Visit Number 3    Number of Visits 17    Date for PT Re-Evaluation 08/09/21    Authorization Type UHC MRC / MCD    Progress Note Due on Visit 10    PT Start Time 1532    PT Stop Time 1615    PT Time Calculation (min) 43 min    Equipment Utilized During Treatment Other (comment);Gait belt   AFO on LLE   Activity Tolerance Patient tolerated treatment well    Behavior During Therapy [facility] for tasks assessed/performed             Past Medical History:  Diagnosis Date   Diabetes mellitus    Hypertension    Seizures ([place])    last seizure march 2016   Stroke [facility])     Past Surgical History:  Procedure Laterality Date   TRACHEOSTOMY CLOSURE      There were no vitals filed for this visit.   Subjective Assessment - 06/21/21 1537     Subjective No new complaints. No falls or pain to report. HEP is going well at home.    Patient is accompained by: Family member    Pertinent History CVA 2011    Limitations Standing;Walking;House hold activities    Patient Stated Goals Patient reports she wants to improve walking and stair negotiation    Currently in Pain? No/denies                [facility] PT Assessment - 06/21/21 1539       Standardized Balance Assessment   Standardized Balance Assessment Berg Balance Test      Berg Balance Test   Sit to Stand Able to stand  independently using hands    Standing Unsupported Able to stand 30 seconds unsupported   1 minute 26 sec's   Sitting with Back Unsupported but Feet Supported on Floor or Stool Able to sit safely and securely 2 minutes    Stand to Sit  Controls descent by using hands    Transfers Able to transfer with verbal cueing and /or supervision    Standing Unsupported with Eyes Closed Able to stand 10 seconds with supervision    Standing Unsupported with Feet Together Able to place feet together independently and stand for 1 minute with supervision    From Standing, Reach Forward with Outstretched Arm Can reach forward >12 cm safely (5")   6 inches   From Standing Position, Pick up Object from Floor Able to pick up shoe, needs supervision    From Standing Position, Turn to Look Behind Over each Shoulder Needs supervision when turning   right>left side turning   Turn 360 Degrees Needs close supervision or verbal cueing    Standing Unsupported, Alternately Place Feet on Step/Stool Needs assistance to keep from falling or unable to try   pt able to shift weight and "small kick" to bottom of 6 inch box for 2 reps each side (4 total) with UE support, min guard assist. unable to lift LE to fully tap top of box.   Standing Unsupported, One Foot in [place] to take small step independently and  hold 30 seconds   left foot forward   Standing on One Leg Tries to lift leg/unable to hold 3 seconds but remains standing independently   right stance   Total Score 31                     [facility] Adult PT Treatment/Exercise - 06/21/21 1539       Transfers   Transfers Sit to Stand;Stand to Sit    Sit to Stand 5: Supervision;4: Min guard;With upper extremity assist;Without upper extremity assist;From bed;From chair/3-in-1    Stand to Sit 5: Supervision;4: Min guard;With upper extremity assist;To bed;To chair/3-in-1    Comments multiple reps in session with cues each time for weight shifting to decrease retropulsion on mat table/chair      Ambulation/Gait   Ambulation/Gait Yes    Ambulation/Gait Assistance 4: Min guard    Ambulation/Gait Assistance Details cues for posture, increased stance time on left LE and step length on right LE.     Ambulation Distance (Feet) 50 Feet   x2   Assistive device Small based quad cane    Gait Pattern Step-to pattern;Decreased arm swing - left;Decreased stride length;Decreased step length - left    Ambulation Surface Level;Indoor                   PT Short Term Goals - 06/19/21 1655       PT SHORT TERM GOAL #1   Title Patient will be I with initial HEP to progress with PT    Baseline KMJGLQ8T    Time 4    Period Weeks    Status New    Target Date 07/12/21      PT SHORT TERM GOAL #2   Title Patient will report </= 4/10 right hip pain with standing/walking to reduce functional limitation and improve mobility    Baseline 4/10 pain    Time 4    Period Weeks    Status New    Target Date 07/13/19      PT SHORT TERM GOAL #3   Title Patient to ambulate 177ft with SBQC and CGA across level ground    Baseline 49ftx2 with QC and CGA    Time 4    Period Weeks    Status New    Target Date 07/12/21      PT SHORT TERM GOAL #4   Title Assess BERGand set goal    Baseline TBD    Time 4    Period Weeks    Status New    Target Date 07/12/21      PT SHORT TERM GOAL #5   Title Assess gait speed and set appropriate goal    Baseline TBD    Time 4    Status New    Target Date 07/12/21               PT Long Term Goals - 06/07/21 1558       PT LONG TERM GOAL #1   Title Patient will be I with final HEP to maintain progress from PT    Baseline KMJGLQ8T    Time 8    Period Weeks    Status New    Target Date 08/09/21      PT LONG TERM GOAL #2   Title Patient will be able to ambulate 74ft using quad cane in order to improve ability to walk to bathroom in apartment, negotiating doorways and perfoming transfer to/from seated position  Baseline 74ft ambulation across open gym floor with CGA    Time 8    Period Weeks    Status New    Target Date 08/09/21      PT LONG TERM GOAL #3   Title Patient will be able to negotiate >/= 6 steps using single rail without assist to  improve community access    Baseline TBD    Time 8    Period Weeks    Status New    Target Date 08/09/21      PT LONG TERM GOAL #4   Title Assess progress towards gait speed    Baseline TBD    Time 8    Period Weeks    Status New    Target Date 08/09/21      PT LONG TERM GOAL #5   Title Assess progress towards BERG    Baseline TBD    Time 8    Period Weeks    Status New    Target Date 08/09/21                   Plan - 06/21/21 1538     Clinical Impression Statement Today's skilled session initially focused on establishing baseline score for Berg Balance Test with score of 31/56 today. Remainder of session continued to focus on transfers and gait with hemiwalker. No issues noted or reported in session other than fatigue with rest breaks taken as needed. No other issues noted or reported in session. The pt is making progress toward goals and should benefit from continued PT to progress toward unmet goals.    Personal Factors and Comorbidities Fitness;Past/Current Experience;Time since onset of injury/illness/exacerbation;Transportation;Comorbidity 3+    Comorbidities CVA, DM, BMI, epilepsy    Examination-Activity Limitations Locomotion Level;Transfers;Reach Overhead;Squat;Stairs;Stand;Carry;Bed Mobility;Bathing;Dressing    Examination-Participation Restrictions Meal Prep;Cleaning;Occupation;Community Activity;Driving;Shop;Laundry;Yard Work    Merchant navy officer Evolving/Moderate complexity    Rehab Potential Fair    PT Frequency 1x / week    PT Duration 8 weeks    PT Treatment/Interventions ADLs/Self Care Home Management;Aquatic Therapy;Cryotherapy;Electrical Stimulation;Iontophoresis 4mg /ml Dexamethasone;Moist Heat;Traction;Ultrasound;DME Instruction;Neuromuscular re-education;Balance training;Therapeutic exercise;Therapeutic activities;Functional mobility training;Stair training;Gait training;Patient/family education;Orthotic Fit/Training;Manual  techniques;Passive range of motion;Dry needling;Joint Manipulations;Spinal Manipulations;Vasopneumatic Device;Taping    PT Next Visit Plan Review HEP and progress PRN, progress LE and core strength as able, gait training, stair training, seated/standing balance    PT Home Exercise Plan [email or web address]    Consulted and Agree with Plan of Care Patient             Patient will benefit from skilled therapeutic intervention in order to improve the following deficits and impairments:  Abnormal gait, Decreased range of motion, Difficulty walking, Impaired UE functional use, Pain, Decreased activity tolerance, Decreased balance, Impaired flexibility, Improper body mechanics, Postural dysfunction, Impaired sensation, Decreased strength, Decreased mobility  Visit Diagnosis: Other abnormalities of gait and mobility  Muscle weakness (generalized)     Problem List Patient Active Problem List   Diagnosis Date Noted   Contracture, left foot 09/20/2020   Neuropathy 09/20/2020   Osteoporosis 09/20/2020   Diabetic ketoacidosis without coma associated with type 2 diabetes mellitus ([place]) 08/11/2020   Seizure ([place]) 08/11/2020   Cerebrovascular disease 08/11/2020   Essential hypertension 08/11/2020   Gastro-esophageal reflux disease without esophagitis 08/11/2020   History of completed stroke 08/11/2020   Hyperglycemia due to type 2 diabetes mellitus ([place]) 08/11/2020   Mixed hyperlipidemia 08/11/2020   Hyperthyroidism 03/11/2019   Diabetes ([place]) 08/12/2018   Palpitations 04/16/2017  Shortness of breath 04/16/2017   Abnormal INR 01/04/2016   Encounter for therapeutic drug level monitoring 08/31/2015   Long term current use of anticoagulant therapy 08/31/2015   Spastic hemiplegia affecting nondominant side ([place]) 10/15/2012   Myalgia and myositis, unspecified 10/15/2012   Cerebral thrombosis with cerebral infarction ([place]) 07/01/2012   Localization-related (focal) (partial) epilepsy and epileptic  syndromes with complex partial seizures, without mention of intractable epilepsy 07/01/2012   Extremity pain 07/01/2012   Partial epilepsy with impairment of consciousness, intractable ([place]) 07/01/2012   CVA (cerebral infarction) 02/11/2012   Contracture of left Achilles tendon 02/11/2012   Willow Ora, PTA, [facility] [address] [place], [place] [address] [phone] 06/22/21, 8:05 PM   Name: Kimberly [name] MRN: [id number] Date of Birth: April 21, 1961

## 2021-06-26 ENCOUNTER — Ambulatory Visit: Payer: Medicare Other

## 2021-06-26 ENCOUNTER — Other Ambulatory Visit: Payer: Self-pay

## 2021-06-26 DIAGNOSIS — M6281 Muscle weakness (generalized): Secondary | ICD-10-CM | POA: Diagnosis not present

## 2021-06-26 DIAGNOSIS — R2689 Other abnormalities of gait and mobility: Secondary | ICD-10-CM | POA: Diagnosis not present

## 2021-06-26 NOTE — Therapy (Signed)
[facility] [address] [place], [place], [address] Phone: [phone]   Fax:  [phone]  Physical Therapy Treatment  Patient Details  Name: Kimberly [name] MRN: [id number] Date of Birth: 1961-04-11 Referring Provider (PT): Trey Sailors, [place]   Encounter Date: 06/26/2021   PT End of Session - 06/26/21 1541     Visit Number 4    Number of Visits 17    Date for PT Re-Evaluation 08/09/21    Authorization Type [employer] MRC / MCD    Progress Note Due on Visit 10    PT Start Time 1761    PT Stop Time 1615    PT Time Calculation (min) 45 min    Equipment Utilized During Treatment Other (comment);Gait belt   AFO on LLE   Activity Tolerance Patient tolerated treatment well    Behavior During Therapy [facility] for tasks assessed/performed             Past Medical History:  Diagnosis Date   Diabetes mellitus    Hypertension    Seizures ([place])    last seizure march 2016   Stroke [facility])     Past Surgical History:  Procedure Laterality Date   TRACHEOSTOMY CLOSURE      There were no vitals filed for this visit.   Subjective Assessment - 06/26/21 1532     Subjective No changes to report, no falls or med changes to note.  Denies R hip pain today    Patient is accompained by: Family member    Pertinent History CVA 2011    Limitations Standing;Walking;House hold activities    Patient Stated Goals Patient reports she wants to improve walking and stair negotiation    Pain Onset More than a month ago                               [facility] Adult PT Treatment/Exercise - 06/26/21 0001       Transfers   Transfers Sit to Stand;Stand to Sit    Sit to Stand 5: Supervision;4: Min guard;With upper extremity assist;Without upper extremity assist;From bed;From chair/3-in-1    Stand to Sit 5: Supervision;4: Min guard;With upper extremity assist;To bed;To chair/3-in-1    Stand Pivot Transfers 4: Min guard;5:  Supervision      Ambulation/Gait   Ambulation/Gait Yes    Ambulation/Gait Assistance 4: Min guard    Ambulation Distance (Feet) 50 Feet    Assistive device Small based quad cane    Gait Pattern Step-to pattern;Decreased arm swing - left;Decreased stride length;Decreased step length - left    Ambulation Surface Level;Indoor    Stairs Yes    Stairs Assistance 3: Mod assist    Stair Management Technique One rail Right;Step to pattern    Gait Comments Unable to control L knee with stair negotiation, 2 bouts of ambulation of 73ft each                 Balance Exercises - 06/26/21 0001       Balance Exercises: Standing   Stepping Strategy Anterior;UE support;10 reps;Limitations    Stepping Strategy Limitations // bars stepping onto 2" block with R foot only due to to L knee ligamentous laxity                  PT Short Term Goals - 06/19/21 1655       PT SHORT TERM GOAL #1   Title Patient  will be I with initial HEP to progress with PT    Baseline KMJGLQ8T    Time 4    Period Weeks    Status New    Target Date 07/12/21      PT SHORT TERM GOAL #2   Title Patient will report </= 4/10 right hip pain with standing/walking to reduce functional limitation and improve mobility    Baseline 4/10 pain    Time 4    Period Weeks    Status New    Target Date 07/13/19      PT SHORT TERM GOAL #3   Title Patient to ambulate 173ft with SBQC and CGA across level ground    Baseline 19ftx2 with QC and CGA    Time 4    Period Weeks    Status New    Target Date 07/12/21      PT SHORT TERM GOAL #4   Title Assess BERGand set goal    Baseline TBD    Time 4    Period Weeks    Status New    Target Date 07/12/21      PT SHORT TERM GOAL #5   Title Assess gait speed and set appropriate goal    Baseline TBD    Time 4    Status New    Target Date 07/12/21               PT Long Term Goals - 06/07/21 1558       PT LONG TERM GOAL #1   Title Patient will be I with  final HEP to maintain progress from PT    Baseline KMJGLQ8T    Time 8    Period Weeks    Status New    Target Date 08/09/21      PT LONG TERM GOAL #2   Title Patient will be able to ambulate 59ft using quad cane in order to improve ability to walk to bathroom in apartment, negotiating doorways and perfoming transfer to/from seated position    Baseline 32ft ambulation across open gym floor with CGA    Time 8    Period Weeks    Status New    Target Date 08/09/21      PT LONG TERM GOAL #3   Title Patient will be able to negotiate >/= 6 steps using single rail without assist to improve community access    Baseline TBD    Time 8    Period Weeks    Status New    Target Date 08/09/21      PT LONG TERM GOAL #4   Title Assess progress towards gait speed    Baseline TBD    Time 8    Period Weeks    Status New    Target Date 08/09/21      PT LONG TERM GOAL #5   Title Assess progress towards BERG    Baseline TBD    Time 8    Period Weeks    Status New    Target Date 08/09/21                   Plan - 06/26/21 1738     Clinical Impression Statement Todays session focusd on gait tolerance with patient unable to extend distance past 5ft w/o fatigue and need of a rest.  Attempted to negotiate a single step but L knee too ligamentously unstable to support her safely.  Performed stepping in // bars using a 2"  step which she was able to negotiate.  Balance training of fwd and retrowalking in // bars 3 trips before fatigued.    Personal Factors and Comorbidities Fitness;Past/Current Experience;Time since onset of injury/illness/exacerbation;Transportation;Comorbidity 3+    Comorbidities CVA, DM, BMI, epilepsy    Examination-Activity Limitations Locomotion Level;Transfers;Reach Overhead;Squat;Stairs;Stand;Carry;Bed Mobility;Bathing;Dressing    Examination-Participation Restrictions Meal Prep;Cleaning;Occupation;Community Activity;Driving;Shop;Laundry;Yard Work     [employer] officer Evolving/Moderate complexity    Rehab Potential Fair    PT Frequency 1x / week    PT Duration 8 weeks    PT Treatment/Interventions ADLs/Self Care Home Management;Aquatic Therapy;Cryotherapy;Electrical Stimulation;Iontophoresis 4mg /ml Dexamethasone;Moist Heat;Traction;Ultrasound;DME Instruction;Neuromuscular re-education;Balance training;Therapeutic exercise;Therapeutic activities;Functional mobility training;Stair training;Gait training;Patient/family education;Orthotic Fit/Training;Manual techniques;Passive range of motion;Dry needling;Joint Manipulations;Spinal Manipulations;Vasopneumatic Device;Taping    PT Next Visit Plan Review HEP and progress PRN, progress LE and core strength as able, gait training, stair training, seated/standing balance, stepping tasks and gait in // bars    PT Home Exercise Plan [email or web address]    Consulted and Agree with Plan of Care Patient             Patient will benefit from skilled therapeutic intervention in order to improve the following deficits and impairments:  Abnormal gait, Decreased range of motion, Difficulty walking, Impaired UE functional use, Pain, Decreased activity tolerance, Decreased balance, Impaired flexibility, Improper body mechanics, Postural dysfunction, Impaired sensation, Decreased strength, Decreased mobility  Visit Diagnosis: Other abnormalities of gait and mobility  Muscle weakness (generalized)  Balance disorder     Problem List Patient Active Problem List   Diagnosis Date Noted   Contracture, left foot 09/20/2020   Neuropathy 09/20/2020   Osteoporosis 09/20/2020   Diabetic ketoacidosis without coma associated with type 2 diabetes mellitus ([place]) 08/11/2020   Seizure ([place]) 08/11/2020   Cerebrovascular disease 08/11/2020   Essential hypertension 08/11/2020   Gastro-esophageal reflux disease without esophagitis 08/11/2020   History of completed stroke 08/11/2020   Hyperglycemia due to type 2  diabetes mellitus ([place]) 08/11/2020   Mixed hyperlipidemia 08/11/2020   Hyperthyroidism 03/11/2019   Diabetes ([place]) 08/12/2018   Palpitations 04/16/2017   Shortness of breath 04/16/2017   Abnormal INR 01/04/2016   Encounter for therapeutic drug level monitoring 08/31/2015   Long term current use of anticoagulant therapy 08/31/2015   Spastic hemiplegia affecting nondominant side ([place]) 10/15/2012   Myalgia and myositis, unspecified 10/15/2012   Cerebral thrombosis with cerebral infarction ([place]) 07/01/2012   Localization-related (focal) (partial) epilepsy and epileptic syndromes with complex partial seizures, without mention of intractable epilepsy 07/01/2012   Extremity pain 07/01/2012   Partial epilepsy with impairment of consciousness, intractable ([place]) 07/01/2012   CVA (cerebral infarction) 02/11/2012   Contracture of left Achilles tendon 02/11/2012    Lanice Shirts, PT 06/26/2021, 5:44 PM  [facility] [address] [place], [place], [address] Phone: [phone]   Fax:  [phone]  Name: Kimberly [name] MRN: [id number] Date of Birth: [birth year]

## 2021-06-26 NOTE — Patient Instructions (Signed)
Access Code: [email or web address] URL: [email or web address] Date: 06/26/2021 Prepared by: Sharlynn Oliphant  Exercises Sit to Stand with Armchair - 2 x daily - 7 x weekly - 1 sets - 5 reps Backward Walking with Counter Support - 2 x daily - 7 x weekly - 1 sets - 5 reps

## 2021-06-29 ENCOUNTER — Other Ambulatory Visit: Payer: Self-pay

## 2021-06-29 ENCOUNTER — Ambulatory Visit: Payer: Medicare Other | Attending: Physician Assistant

## 2021-06-29 DIAGNOSIS — M25551 Pain in right hip: Secondary | ICD-10-CM | POA: Insufficient documentation

## 2021-06-29 DIAGNOSIS — M6281 Muscle weakness (generalized): Secondary | ICD-10-CM | POA: Insufficient documentation

## 2021-06-29 DIAGNOSIS — R2681 Unsteadiness on feet: Secondary | ICD-10-CM | POA: Diagnosis not present

## 2021-06-29 DIAGNOSIS — R2689 Other abnormalities of gait and mobility: Secondary | ICD-10-CM | POA: Insufficient documentation

## 2021-06-29 NOTE — Therapy (Signed)
[facility] [address] [place], [place], [address] Phone: [phone]   Fax:  [phone]  Physical Therapy Treatment  Patient Details  Name: Kimberly [name] MRN: [id number] Date of Birth: 1960/09/20 Referring Provider (PT): Trey Sailors, [place]   Encounter Date: 06/29/2021   PT End of Session - 06/29/21 1609     Visit Number 5    Number of Visits 17    Date for PT Re-Evaluation 08/09/21    Authorization Type UHC MRC / MCD    Progress Note Due on Visit 10    PT Start Time 2426    PT Stop Time 1530    PT Time Calculation (min) 45 min    Equipment Utilized During Treatment Other (comment);Gait belt   AFO on LLE   Activity Tolerance Patient tolerated treatment well    Behavior During Therapy [facility] for tasks assessed/performed             Past Medical History:  Diagnosis Date   Diabetes mellitus    Hypertension    Seizures ([place])    last seizure march 2016   Stroke [facility])     Past Surgical History:  Procedure Laterality Date   TRACHEOSTOMY CLOSURE      There were no vitals filed for this visit.   Subjective Assessment - 06/29/21 1450     Subjective No changes to report, no falls    Patient is accompained by: Family member    Pertinent History CVA 2011    Limitations Standing;Walking;House hold activities    Patient Stated Goals Patient reports she wants to improve walking and stair negotiation    Pain Onset More than a month ago                               [facility] Adult PT Treatment/Exercise - 06/29/21 0001       Transfers   Transfers Sit to Stand;Stand to Sit    Sit to Stand 5: Supervision    Stand to Sit 5: Supervision    Stand Pivot Transfers 4: Min guard;5: Supervision    Comments 5x keeping pelvis midline with TCs as needed      Ambulation/Gait   Ambulation/Gait Yes    Ambulation/Gait Assistance 4: Min guard    Ambulation Distance (Feet) 115 Feet    Assistive  device Small based quad cane    Gait Pattern Step-to pattern;Decreased arm swing - left;Decreased stride length;Decreased step length - left    Ambulation Surface Level;Indoor                 Balance Exercises - 06/29/21 0001       Balance Exercises: Standing   Stepping Strategy Anterior;UE support;10 reps;Limitations    Stepping Strategy Limitations // bars stepping onto 2" block with R foot only due to to L knee ligamentous laxity, 8/10 times      Balance Exercises: Seated   Other Seated Exercises retrieving 5#KB from floor 10x midline and 10x to L                  PT Short Term Goals - 06/19/21 1655       PT SHORT TERM GOAL #1   Title Patient will be I with initial HEP to progress with PT    Baseline KMJGLQ8T    Time 4    Period Weeks    Status New    Target Date  07/12/21      PT SHORT TERM GOAL #2   Title Patient will report </= 4/10 right hip pain with standing/walking to reduce functional limitation and improve mobility    Baseline 4/10 pain    Time 4    Period Weeks    Status New    Target Date 07/13/19      PT SHORT TERM GOAL #3   Title Patient to ambulate 165ft with SBQC and CGA across level ground    Baseline 56ftx2 with QC and CGA    Time 4    Period Weeks    Status New    Target Date 07/12/21      PT SHORT TERM GOAL #4   Title Assess BERGand set goal    Baseline TBD    Time 4    Period Weeks    Status New    Target Date 07/12/21      PT SHORT TERM GOAL #5   Title Assess gait speed and set appropriate goal    Baseline TBD    Time 4    Status New    Target Date 07/12/21               PT Long Term Goals - 06/07/21 1558       PT LONG TERM GOAL #1   Title Patient will be I with final HEP to maintain progress from PT    Baseline KMJGLQ8T    Time 8    Period Weeks    Status New    Target Date 08/09/21      PT LONG TERM GOAL #2   Title Patient will be able to ambulate 33ft using quad cane in order to improve ability to  walk to bathroom in apartment, negotiating doorways and perfoming transfer to/from seated position    Baseline 36ft ambulation across open gym floor with CGA    Time 8    Period Weeks    Status New    Target Date 08/09/21      PT LONG TERM GOAL #3   Title Patient will be able to negotiate >/= 6 steps using single rail without assist to improve community access    Baseline TBD    Time 8    Period Weeks    Status New    Target Date 08/09/21      PT LONG TERM GOAL #4   Title Assess progress towards gait speed    Baseline TBD    Time 8    Period Weeks    Status New    Target Date 08/09/21      PT LONG TERM GOAL #5   Title Assess progress towards BERG    Baseline TBD    Time 8    Period Weeks    Status New    Target Date 08/09/21                   Plan - 06/29/21 1618     Clinical Impression Statement Todays session concentrated on stepping tasks, WS onto LLE and attaning misline in standing and seated.  Able to step up onto both a 2" step initially then a 4" step using HW for support.  Ambulation distance increased to 152ft.  Improved ability to find midline in siting and standing but not consistently.  Unable to cross midline due to decrease proprioceptive awareness.    Personal Factors and Comorbidities Fitness;Past/Current Experience;Time since onset of injury/illness/exacerbation;Transportation;Comorbidity 3+    Comorbidities  CVA, DM, BMI, epilepsy    Examination-Activity Limitations Locomotion Level;Transfers;Reach Overhead;Squat;Stairs;Stand;Carry;Bed Mobility;Bathing;Dressing    Examination-Participation Restrictions Meal Prep;Cleaning;Occupation;Community Activity;Driving;Shop;Laundry;Yard Work    [employer] officer Evolving/Moderate complexity    Rehab Potential Fair    PT Frequency 1x / week    PT Duration 8 weeks    PT Treatment/Interventions ADLs/Self Care Home Management;Aquatic Therapy;Cryotherapy;Electrical Stimulation;Iontophoresis  4mg /ml Dexamethasone;Moist Heat;Traction;Ultrasound;DME Instruction;Neuromuscular re-education;Balance training;Therapeutic exercise;Therapeutic activities;Functional mobility training;Stair training;Gait training;Patient/family education;Orthotic Fit/Training;Manual techniques;Passive range of motion;Dry needling;Joint Manipulations;Spinal Manipulations;Vasopneumatic Device;Taping    PT Next Visit Plan Review HEP and progress PRN, progress LE and core strength as able, gait training, stair training, seated/standing balance, stepping tasks and gait in // bars or with HW for support, increase stepping heigth as tol    PT Home Exercise Plan [email or web address]    Consulted and Agree with Plan of Care Patient             Patient will benefit from skilled therapeutic intervention in order to improve the following deficits and impairments:  Abnormal gait, Decreased range of motion, Difficulty walking, Impaired UE functional use, Pain, Decreased activity tolerance, Decreased balance, Impaired flexibility, Improper body mechanics, Postural dysfunction, Impaired sensation, Decreased strength, Decreased mobility  Visit Diagnosis: Other abnormalities of gait and mobility  Muscle weakness (generalized)  Balance disorder     Problem List Patient Active Problem List   Diagnosis Date Noted   Contracture, left foot 09/20/2020   Neuropathy 09/20/2020   Osteoporosis 09/20/2020   Diabetic ketoacidosis without coma associated with type 2 diabetes mellitus ([place]) 08/11/2020   Seizure ([place]) 08/11/2020   Cerebrovascular disease 08/11/2020   Essential hypertension 08/11/2020   Gastro-esophageal reflux disease without esophagitis 08/11/2020   History of completed stroke 08/11/2020   Hyperglycemia due to type 2 diabetes mellitus ([place]) 08/11/2020   Mixed hyperlipidemia 08/11/2020   Hyperthyroidism 03/11/2019   Diabetes ([place]) 08/12/2018   Palpitations 04/16/2017   Shortness of breath 04/16/2017   Abnormal INR  01/04/2016   Encounter for therapeutic drug level monitoring 08/31/2015   Long term current use of anticoagulant therapy 08/31/2015   Spastic hemiplegia affecting nondominant side ([place]) 10/15/2012   Myalgia and myositis, unspecified 10/15/2012   Cerebral thrombosis with cerebral infarction ([place]) 07/01/2012   Localization-related (focal) (partial) epilepsy and epileptic syndromes with complex partial seizures, without mention of intractable epilepsy 07/01/2012   Extremity pain 07/01/2012   Partial epilepsy with impairment of consciousness, intractable ([place]) 07/01/2012   CVA (cerebral infarction) 02/11/2012   Contracture of left Achilles tendon 02/11/2012    Lanice Shirts, PT 06/29/2021, 4:22 PM  [facility] [address] [place], [place], [address] Phone: [phone]   Fax:  [phone]  Name: Kimberly [name] MRN: [id number] Date of Birth: 09-26-1960

## 2021-07-03 ENCOUNTER — Ambulatory Visit: Payer: Medicare Other

## 2021-07-03 ENCOUNTER — Telehealth: Payer: Self-pay

## 2021-07-03 ENCOUNTER — Other Ambulatory Visit: Payer: Self-pay

## 2021-07-03 DIAGNOSIS — M25551 Pain in right hip: Secondary | ICD-10-CM

## 2021-07-03 DIAGNOSIS — R2689 Other abnormalities of gait and mobility: Secondary | ICD-10-CM

## 2021-07-03 DIAGNOSIS — R2681 Unsteadiness on feet: Secondary | ICD-10-CM | POA: Diagnosis not present

## 2021-07-03 DIAGNOSIS — M6281 Muscle weakness (generalized): Secondary | ICD-10-CM | POA: Diagnosis not present

## 2021-07-03 NOTE — Telephone Encounter (Signed)
In error

## 2021-07-03 NOTE — Therapy (Signed)
[facility] [address] [place], [place], [address] Phone: [phone]   Fax:  [phone]  Physical Therapy Treatment  Patient Details  Name: Kimberly [name] MRN: [id number] Date of Birth: 08/04/1961 Referring Provider (PT): Trey Sailors, [place]   Encounter Date: 07/03/2021   PT End of Session - 07/03/21 1405     Visit Number 6    Number of Visits 17    Date for PT Re-Evaluation 08/09/21    Authorization Type [employer] MRC / MCD    Progress Note Due on Visit 10    PT Start Time 1315    PT Stop Time 1400    PT Time Calculation (min) 45 min    Equipment Utilized During Treatment Other (comment);Gait belt   AFO on LLE   Activity Tolerance Patient tolerated treatment well    Behavior During Therapy [facility] for tasks assessed/performed             Past Medical History:  Diagnosis Date   Diabetes mellitus    Hypertension    Seizures ([place])    last seizure march 2016   Stroke [facility])     Past Surgical History:  Procedure Laterality Date   TRACHEOSTOMY CLOSURE      There were no vitals filed for this visit.   Subjective Assessment - 07/03/21 1332     Subjective feels she is walking farther and transferring wih less effort    Patient is accompained by: Family member    Pertinent History CVA 2011    Limitations Standing;Walking;House hold activities    Patient Stated Goals Patient reports she wants to improve walking and stair negotiation    Pain Onset More than a month ago                               [facility] Adult PT Treatment/Exercise - 07/03/21 0001       Transfers   Transfers Sit to Stand;Stand to Sit    Sit to Stand 5: Supervision;4: Min guard    Stand to Sit 5: Supervision;4: Min guard    Stand Pivot Transfers 5: Supervision;4: Min guard    Number of Reps 10 reps    Comments encouraged WS to midline      Ambulation/Gait   Ambulation/Gait Yes    Ambulation/Gait Assistance 4: Min  guard    Ambulation Distance (Feet) 115 Feet    Assistive device Small based quad cane    Gait Pattern Step-to pattern;Decreased arm swing - left;Decreased stride length;Decreased step length - left    Ambulation Surface Level;Indoor                 Balance Exercises - 07/03/21 0001       Balance Exercises: Standing   Stepping Strategy Anterior;UE support;10 reps;Limitations    Stepping Strategy Limitations HW support, 4" block 10x encouraged posture    Other Standing Exercises WS to L in standing with HW support, able to reach midline      Balance Exercises: Seated   Other Seated Exercises reaching to random targets held by PT in PNF arrangement    Other Seated Exercises Comments hip tosses, shoulder tosses, chops and Vs with RUE using 2.2# ball 10x each.                  PT Short Term Goals - 06/19/21 1655       PT SHORT TERM GOAL #1  Title Patient will be I with initial HEP to progress with PT    Baseline [id number]    Time 4    Period Weeks    Status New    Target Date 07/12/21      PT SHORT TERM GOAL #2   Title Patient will report </= 4/10 right hip pain with standing/walking to reduce functional limitation and improve mobility    Baseline 4/10 pain    Time 4    Period Weeks    Status New    Target Date 07/13/19      PT SHORT TERM GOAL #3   Title Patient to ambulate 150ft with SBQC and CGA across level ground    Baseline 76ftx2 with QC and CGA    Time 4    Period Weeks    Status New    Target Date 07/12/21      PT SHORT TERM GOAL #4   Title Assess BERGand set goal    Baseline TBD    Time 4    Period Weeks    Status New    Target Date 07/12/21      PT SHORT TERM GOAL #5   Title Assess gait speed and set appropriate goal    Baseline TBD    Time 4    Status New    Target Date 07/12/21               PT Long Term Goals - 06/07/21 1558       PT LONG TERM GOAL #1   Title Patient will be I with final HEP to maintain progress from  PT    Baseline [id number]    Time 8    Period Weeks    Status New    Target Date 08/09/21      PT LONG TERM GOAL #2   Title Patient will be able to ambulate 36ft using quad cane in order to improve ability to walk to bathroom in apartment, negotiating doorways and perfoming transfer to/from seated position    Baseline 78ft ambulation across open gym floor with CGA    Time 8    Period Weeks    Status New    Target Date 08/09/21      PT LONG TERM GOAL #3   Title Patient will be able to negotiate >/= 6 steps using single rail without assist to improve community access    Baseline TBD    Time 8    Period Weeks    Status New    Target Date 08/09/21      PT LONG TERM GOAL #4   Title Assess progress towards gait speed    Baseline TBD    Time 8    Period Weeks    Status New    Target Date 08/09/21      PT LONG TERM GOAL #5   Title Assess progress towards BERG    Baseline TBD    Time 8    Period Weeks    Status New    Target Date 08/09/21                   Plan - 07/03/21 1406     Clinical Impression Statement todays session continued focus on stepping tasks and working in midline.  Ability to find midline and maintain it has improved.  Ambulation distance unchanged.  Patient able to perform RLE step up onto 4" step more consistently, adding postural correction and WS to R.  Continued to challenge sitting balance with random reaching    Personal Factors and Comorbidities Fitness;Past/Current Experience;Time since onset of injury/illness/exacerbation;Transportation;Comorbidity 3+    Comorbidities CVA, DM, BMI, epilepsy    Examination-Activity Limitations Locomotion Level;Transfers;Reach Overhead;Squat;Stairs;Stand;Carry;Bed Mobility;Bathing;Dressing    Examination-Participation Restrictions Meal Prep;Cleaning;Occupation;Community Activity;Driving;Shop;Laundry;Yard Work    [employer] officer Evolving/Moderate complexity    Rehab Potential Fair    PT  Frequency 1x / week    PT Duration 8 weeks    PT Treatment/Interventions ADLs/Self Care Home Management;Aquatic Therapy;Cryotherapy;Electrical Stimulation;Iontophoresis 4mg /ml Dexamethasone;Moist Heat;Traction;Ultrasound;DME Instruction;Neuromuscular re-education;Balance training;Therapeutic exercise;Therapeutic activities;Functional mobility training;Stair training;Gait training;Patient/family education;Orthotic Fit/Training;Manual techniques;Passive range of motion;Dry needling;Joint Manipulations;Spinal Manipulations;Vasopneumatic Device;Taping    PT Next Visit Plan continue working with achieving and maintaining midline, transfer and reaching tasks, sitting balance    PT Home Exercise Plan [email or web address]    Consulted and Agree with Plan of Care Patient             Patient will benefit from skilled therapeutic intervention in order to improve the following deficits and impairments:  Abnormal gait, Decreased range of motion, Difficulty walking, Impaired UE functional use, Pain, Decreased activity tolerance, Decreased balance, Impaired flexibility, Improper body mechanics, Postural dysfunction, Impaired sensation, Decreased strength, Decreased mobility  Visit Diagnosis: Other abnormalities of gait and mobility  Muscle weakness (generalized)  Balance disorder  Pain in right hip     Problem List Patient Active Problem List   Diagnosis Date Noted   Contracture, left foot 09/20/2020   Neuropathy 09/20/2020   Osteoporosis 09/20/2020   Diabetic ketoacidosis without coma associated with type 2 diabetes mellitus ([place]) 08/11/2020   Seizure ([employer]) 08/11/2020   Cerebrovascular disease 08/11/2020   Essential hypertension 08/11/2020   Gastro-esophageal reflux disease without esophagitis 08/11/2020   History of completed stroke 08/11/2020   Hyperglycemia due to type 2 diabetes mellitus ([place]) 08/11/2020   Mixed hyperlipidemia 08/11/2020   Hyperthyroidism 03/11/2019   Diabetes ([place]) 08/12/2018    Palpitations 04/16/2017   Shortness of breath 04/16/2017   Abnormal INR 01/04/2016   Encounter for therapeutic drug level monitoring 08/31/2015   Long term current use of anticoagulant therapy 08/31/2015   Spastic hemiplegia affecting nondominant side ([place]) 10/15/2012   Myalgia and myositis, unspecified 10/15/2012   Cerebral thrombosis with cerebral infarction ([place]) 07/01/2012   Localization-related (focal) (partial) epilepsy and epileptic syndromes with complex partial seizures, without mention of intractable epilepsy 07/01/2012   Extremity pain 07/01/2012   Partial epilepsy with impairment of consciousness, intractable ([place]) 07/01/2012   CVA (cerebral infarction) 02/11/2012   Contracture of left Achilles tendon 02/11/2012    Lanice Shirts, PT 07/03/2021, 2:15 PM  [facility] [address] [place], [place], [address] Phone: [phone]   Fax:  [phone]  Name: Kimberly [name] MRN: [id number] Date of Birth: 1961/03/26

## 2021-07-05 ENCOUNTER — Ambulatory Visit: Payer: Medicare Other

## 2021-07-05 ENCOUNTER — Other Ambulatory Visit: Payer: Self-pay

## 2021-07-05 VITALS — HR 104

## 2021-07-05 DIAGNOSIS — R2689 Other abnormalities of gait and mobility: Secondary | ICD-10-CM | POA: Diagnosis not present

## 2021-07-05 DIAGNOSIS — R2681 Unsteadiness on feet: Secondary | ICD-10-CM

## 2021-07-05 DIAGNOSIS — M6281 Muscle weakness (generalized): Secondary | ICD-10-CM | POA: Diagnosis not present

## 2021-07-05 DIAGNOSIS — M25551 Pain in right hip: Secondary | ICD-10-CM | POA: Diagnosis not present

## 2021-07-05 NOTE — Therapy (Signed)
[facility] [address] [place], [place], [address] Phone: [phone]   Fax:  [phone]  Physical Therapy Treatment  Patient Details  Name: Kimberly [name] MRN: [id number] Date of Birth: Apr 25, 1961 Referring Provider (PT): Trey Sailors, [place]   Encounter Date: 07/05/2021   PT End of Session - 07/05/21 1331     Visit Number 7    Number of Visits 17    Date for PT Re-Evaluation 08/09/21    Authorization Type [employer] MRC / MCD    Progress Note Due on Visit 10    PT Start Time 1315    PT Stop Time 1400    PT Time Calculation (min) 45 min    Equipment Utilized During Treatment Other (comment);Gait belt   AFO on LLE   Activity Tolerance Patient tolerated treatment well    Behavior During Therapy [facility] for tasks assessed/performed             Past Medical History:  Diagnosis Date   Diabetes mellitus    Hypertension    Seizures ([place])    last seizure march 2016   Stroke [facility])     Past Surgical History:  Procedure Laterality Date   TRACHEOSTOMY CLOSURE      Vitals:   07/05/21 1405  Pulse: (!) 104  SpO2: 98%     Subjective Assessment - 07/05/21 1320     Subjective No changes to note, no R hip pain reported.    Patient is accompained by: Family member    Pertinent History CVA 2011    Limitations Standing;Walking;House hold activities    Patient Stated Goals Patient reports she wants to improve walking and stair negotiation    Pain Onset More than a month ago                               [facility] Adult PT Treatment/Exercise - 07/05/21 0001       Transfers   Transfers Sit to Stand;Stand to Sit    Sit to Stand 5: Supervision;4: Min guard    Stand to Sit 5: Supervision;4: Min guard    Stand Pivot Transfers 5: Supervision;4: Min guard      Ambulation/Gait   Ambulation/Gait Yes    Ambulation/Gait Assistance 4: Min guard    Ambulation Distance (Feet) 115 Feet    Assistive device  Small based quad cane    Gait Pattern Step-to pattern;Decreased arm swing - left;Decreased stride length;Decreased step length - left    Ambulation Surface Level;Indoor      Manual Therapy   Manual Therapy Soft tissue mobilization;Myofascial release    Manual therapy comments L sidelie, STM and MR to R lumbocsacral region utilizing opposite hip/shoulder rotation                 Balance Exercises - 07/05/21 0001       Balance Exercises: Standing   Stepping Strategy Anterior;UE support;10 reps;Limitations    Stepping Strategy Limitations HW support, 4" block 10x encouraged posture    Sidestepping Upper extremity support;Limitations    Sidestepping Limitations lateral steps 10x ea. side    Other Standing Exercises WS to L in standing with HW support, able to reach midline                  PT Short Term Goals - 07/05/21 1319       PT SHORT TERM GOAL #1   Title Patient  will be I with initial HEP to progress with PT    Baseline KMJGLQ8T    Time 4    Period Weeks    Status New    Target Date 07/12/21      PT SHORT TERM GOAL #2   Title Patient will report </= 4/10 right hip pain with standing/walking to reduce functional limitation and improve mobility    Baseline 4/10 pain; 07/05/21 reporting no pain over last 2 sessions.    Time 4    Period Weeks    Status Achieved    Target Date 07/13/19      PT SHORT TERM GOAL #3   Title Patient to ambulate 153ft with SBQC and CGA across level ground    Baseline 45ftx2 with QC and CGA    Time 4    Period Weeks    Status New    Target Date 07/12/21      PT SHORT TERM GOAL #4   Title Assess BERGand set goal; 07/05/21 BERG goal is 38    Baseline TBD; BERG score 31    Time 4    Period Weeks    Status Achieved    Target Date 07/12/21      PT SHORT TERM GOAL #5   Title Assess gait speed and set appropriate goal    Baseline TBD    Time 4    Status New    Target Date 07/12/21               PT Long Term Goals -  06/07/21 1558       PT LONG TERM GOAL #1   Title Patient will be I with final HEP to maintain progress from PT    Baseline KMJGLQ8T    Time 8    Period Weeks    Status New    Target Date 08/09/21      PT LONG TERM GOAL #2   Title Patient will be able to ambulate 23ft using quad cane in order to improve ability to walk to bathroom in apartment, negotiating doorways and perfoming transfer to/from seated position    Baseline 52ft ambulation across open gym floor with CGA    Time 8    Period Weeks    Status New    Target Date 08/09/21      PT LONG TERM GOAL #3   Title Patient will be able to negotiate >/= 6 steps using single rail without assist to improve community access    Baseline TBD    Time 8    Period Weeks    Status New    Target Date 08/09/21      PT LONG TERM GOAL #4   Title Assess progress towards gait speed    Baseline TBD    Time 8    Period Weeks    Status New    Target Date 08/09/21      PT LONG TERM GOAL #5   Title Assess progress towards BERG    Baseline TBD    Time 8    Period Weeks    Status New    Target Date 08/09/21                   Plan - 07/05/21 1405     Clinical Impression Statement Todays session focused on continued gait , tranfer and mobility training.  Unable to complete 115 ft ambulation w/o rest brea which was needed to address R lumbosacral pain.  Attempted  STM to stretch region in L sidelie.  Advanced to anterior weightshifting with R foot on 2" block. R lumbosacral pain limited standing tolerance today.    Personal Factors and Comorbidities Fitness;Past/Current Experience;Time since onset of injury/illness/exacerbation;Transportation;Comorbidity 3+    Comorbidities CVA, DM, BMI, epilepsy    Examination-Activity Limitations Locomotion Level;Transfers;Reach Overhead;Squat;Stairs;Stand;Carry;Bed Mobility;Bathing;Dressing    Examination-Participation Restrictions Meal Prep;Cleaning;Occupation;Community  Activity;Driving;Shop;Laundry;Yard Work    [employer] officer Evolving/Moderate complexity    Rehab Potential Fair    PT Frequency 2x / week    PT Duration 8 weeks    PT Treatment/Interventions ADLs/Self Care Home Management;Aquatic Therapy;Cryotherapy;Electrical Stimulation;Iontophoresis 4mg /ml Dexamethasone;Moist Heat;Traction;Ultrasound;DME Instruction;Neuromuscular re-education;Balance training;Therapeutic exercise;Therapeutic activities;Functional mobility training;Stair training;Gait training;Patient/family education;Orthotic Fit/Training;Manual techniques;Passive range of motion;Dry needling;Joint Manipulations;Spinal Manipulations;Vasopneumatic Device;Taping    PT Next Visit Plan continue working with achieving and maintaining midline, transfer and reaching tasks, sitting balance, ambulation refinement    PT Home Exercise Plan [email or web address]    Consulted and Agree with Plan of Care Patient             Patient will benefit from skilled therapeutic intervention in order to improve the following deficits and impairments:  Abnormal gait, Decreased range of motion, Difficulty walking, Impaired UE functional use, Pain, Decreased activity tolerance, Decreased balance, Impaired flexibility, Improper body mechanics, Postural dysfunction, Impaired sensation, Decreased strength, Decreased mobility  Visit Diagnosis: Unsteadiness on feet  Muscle weakness (generalized)  Balance disorder  Pain in right hip     Problem List Patient Active Problem List   Diagnosis Date Noted   Contracture, left foot 09/20/2020   Neuropathy 09/20/2020   Osteoporosis 09/20/2020   Diabetic ketoacidosis without coma associated with type 2 diabetes mellitus ([place]) 08/11/2020   Seizure ([place]) 08/11/2020   Cerebrovascular disease 08/11/2020   Essential hypertension 08/11/2020   Gastro-esophageal reflux disease without esophagitis 08/11/2020   History of completed stroke 08/11/2020   Hyperglycemia  due to type 2 diabetes mellitus ([place]) 08/11/2020   Mixed hyperlipidemia 08/11/2020   Hyperthyroidism 03/11/2019   Diabetes ([place]) 08/12/2018   Palpitations 04/16/2017   Shortness of breath 04/16/2017   Abnormal INR 01/04/2016   Encounter for therapeutic drug level monitoring 08/31/2015   Long term current use of anticoagulant therapy 08/31/2015   Spastic hemiplegia affecting nondominant side ([place]) 10/15/2012   Myalgia and myositis, unspecified 10/15/2012   Cerebral thrombosis with cerebral infarction ([place]) 07/01/2012   Localization-related (focal) (partial) epilepsy and epileptic syndromes with complex partial seizures, without mention of intractable epilepsy 07/01/2012   Extremity pain 07/01/2012   Partial epilepsy with impairment of consciousness, intractable ([place]) 07/01/2012   CVA (cerebral infarction) 02/11/2012   Contracture of left Achilles tendon 02/11/2012    Lanice Shirts, PT 07/05/2021, 2:23 PM  [facility] [address] [place], [place], [address] Phone: [phone]   Fax:  [phone]  Name: Kimberly [name] MRN: [id number] Date of Birth: October 03, 1960

## 2021-07-10 ENCOUNTER — Encounter: Payer: Self-pay | Admitting: Podiatry

## 2021-07-10 ENCOUNTER — Other Ambulatory Visit: Payer: Self-pay

## 2021-07-10 ENCOUNTER — Ambulatory Visit (INDEPENDENT_AMBULATORY_CARE_PROVIDER_SITE_OTHER): Payer: Medicare Other | Admitting: Podiatry

## 2021-07-10 DIAGNOSIS — M79675 Pain in left toe(s): Secondary | ICD-10-CM | POA: Diagnosis not present

## 2021-07-10 DIAGNOSIS — E1142 Type 2 diabetes mellitus with diabetic polyneuropathy: Secondary | ICD-10-CM

## 2021-07-10 DIAGNOSIS — M79674 Pain in right toe(s): Secondary | ICD-10-CM | POA: Diagnosis not present

## 2021-07-10 DIAGNOSIS — B351 Tinea unguium: Secondary | ICD-10-CM | POA: Diagnosis not present

## 2021-07-11 ENCOUNTER — Ambulatory Visit: Payer: Medicare Other

## 2021-07-11 DIAGNOSIS — M25551 Pain in right hip: Secondary | ICD-10-CM | POA: Diagnosis not present

## 2021-07-11 DIAGNOSIS — M6281 Muscle weakness (generalized): Secondary | ICD-10-CM

## 2021-07-11 DIAGNOSIS — R2689 Other abnormalities of gait and mobility: Secondary | ICD-10-CM

## 2021-07-11 DIAGNOSIS — R2681 Unsteadiness on feet: Secondary | ICD-10-CM

## 2021-07-11 NOTE — Therapy (Signed)
[facility] [address] [place], [place], [address] Phone: [phone]   Fax:  [phone]  Physical Therapy Treatment  Patient Details  Name: Jeanine Caven MRN: [id number] Date of Birth: 06-18-1961 Referring Provider (PT): Trey Sailors, [place]   Encounter Date: 07/11/2021   PT End of Session - 07/11/21 1412     Visit Number 8    Number of Visits 17    Date for PT Re-Evaluation 08/09/21    Authorization Type [employer] [facility] / MCD    Authorization Time Period 06/07/21-08/10/21    Progress Note Due on Visit 10    PT Start Time 1230    PT Stop Time 1315    PT Time Calculation (min) 45 min    Equipment Utilized During Treatment Other (comment);Gait belt   AFO on LLE   Activity Tolerance Patient tolerated treatment well    Behavior During Therapy [facility] for tasks assessed/performed             Past Medical History:  Diagnosis Date   Diabetes mellitus    Hypertension    Seizures ([place])    last seizure march 2016   Stroke [facility])     Past Surgical History:  Procedure Laterality Date   TRACHEOSTOMY CLOSURE      There were no vitals filed for this visit.   Subjective Assessment - 07/11/21 1409     Subjective Has obtained new L arm sling for PT to apply.    Patient is accompained by: Family member    Pertinent History CVA 2011    Limitations Standing;Walking;House hold activities    Patient Stated Goals Patient reports she wants to improve walking and stair negotiation    Pain Onset More than a month ago                               [facility] Adult PT Treatment/Exercise - 07/11/21 0001       Transfers   Transfers Sit to Stand;Stand to Sit    Sit to Stand 5: Supervision;4: Min guard    Stand to Sit 5: Supervision;4: Min guard    Stand Pivot Transfers 5: Supervision;4: Min guard    Number of Reps 10 reps    Comments L knee blocked by PT, encouraged OH reach with RUE and facilitated trunk  extension and midline.      Ambulation/Gait   Ambulation/Gait Yes    Ambulation/Gait Assistance 4: Min guard    Ambulation Distance (Feet) 115 Feet    Assistive device Small based quad cane    Gait Pattern Step-to pattern;Decreased arm swing - left;Decreased stride length;Decreased step length - left    Ambulation Surface Level;Indoor                 Balance Exercises - 07/11/21 0001       Balance Exercises: Standing   Stepping Strategy Anterior;UE support;10 reps;Limitations    Stepping Strategy Limitations in //bars tapping floor target 10x and stepping and WS onto 2" step 10x                  PT Short Term Goals - 07/11/21 1436       PT SHORT TERM GOAL #1   Title Patient will be I with initial HEP to progress with PT    Baseline [id number]    Time 4    Period Weeks    Status New  Target Date 07/12/21      PT SHORT TERM GOAL #2   Title Patient will report </= 4/10 right hip pain with standing/walking to reduce functional limitation and improve mobility    Baseline 4/10 pain; 07/05/21 reporting no pain over last 2 sessions.    Time 4    Period Weeks    Status Achieved    Target Date 07/13/19      PT SHORT TERM GOAL #3   Title Patient to ambulate 155ft with [facility] and CGA across level ground    Baseline 77ftx2 with QC and CGA; 07/11/21 Able to ambulate 178ft with 1 rest break.    Time 4    Period Weeks    Status On-going    Target Date 07/12/21      PT SHORT TERM GOAL #4   Title Assess BERGand set goal; 07/05/21 BERG goal is 38    Baseline TBD; BERG score 31    Time 4    Period Weeks    Status Achieved    Target Date 07/12/21      PT SHORT TERM GOAL #5   Title Assess gait speed and set appropriate goal    Baseline TBD    Time 4    Status New    Target Date 07/12/21               PT Long Term Goals - 06/07/21 1558       PT LONG TERM GOAL #1   Title Patient will be I with final HEP to maintain progress from PT    Baseline KMJGLQ8T     Time 8    Period Weeks    Status New    Target Date 08/09/21      PT LONG TERM GOAL #2   Title Patient will be able to ambulate 65ft using quad cane in order to improve ability to walk to bathroom in apartment, negotiating doorways and perfoming transfer to/from seated position    Baseline 76ft ambulation across open gym floor with CGA    Time 8    Period Weeks    Status New    Target Date 08/09/21      PT LONG TERM GOAL #3   Title Patient will be able to negotiate >/= 6 steps using single rail without assist to improve community access    Baseline TBD    Time 8    Period Weeks    Status New    Target Date 08/09/21      PT LONG TERM GOAL #4   Title Assess progress towards gait speed    Baseline TBD    Time 8    Period Weeks    Status New    Target Date 08/09/21      PT LONG TERM GOAL #5   Title Assess progress towards BERG    Baseline TBD    Time 8    Period Weeks    Status New    Target Date 08/09/21                   Plan - 07/11/21 1426     Clinical Impression Statement Continued gait training and stepping tasks encouraging WS onto LLE.  Required 1 rest break during 123ft ambulation, HR 120 and O2 sats 99%, baseline HR is 100.  Fitted for LUE sling to relieve pressure on L shoulder which bought online.  Advanced to steping tasks in // bars including tapping floor target with  R foot then stepping onto 2" block for WS.  PT facilitated postural correction and provided stability at L knee.    Personal Factors and Comorbidities Fitness;Past/Current Experience;Time since onset of injury/illness/exacerbation;Transportation;Comorbidity 3+    Comorbidities CVA, DM, BMI, epilepsy    Examination-Activity Limitations Locomotion Level;Transfers;Reach Overhead;Squat;Stairs;Stand;Carry;Bed Mobility;Bathing;Dressing    Examination-Participation Restrictions Meal Prep;Cleaning;Occupation;Community Activity;Driving;Shop;Laundry;Yard Work    Multimedia programmer Evolving/Moderate complexity    Rehab Potential Fair    PT Frequency 2x / week    PT Duration 8 weeks    PT Treatment/Interventions ADLs/Self Care Home Management;Aquatic Therapy;Cryotherapy;Electrical Stimulation;Iontophoresis 4mg /ml Dexamethasone;Moist Heat;Traction;Ultrasound;DME Instruction;Neuromuscular re-education;Balance training;Therapeutic exercise;Therapeutic activities;Functional mobility training;Stair training;Gait training;Patient/family education;Orthotic Fit/Training;Manual techniques;Passive range of motion;Dry needling;Joint Manipulations;Spinal Manipulations;Vasopneumatic Device;Taping    PT Next Visit Plan continue working with achieving and maintaining midline, transfer and reaching tasks, sitting balance, ambulation refinement, stepping and WS    PT Home Exercise Plan [email or web address]    Consulted and Agree with Plan of Care Patient             Patient will benefit from skilled therapeutic intervention in order to improve the following deficits and impairments:  Abnormal gait, Decreased range of motion, Difficulty walking, Impaired UE functional use, Pain, Decreased activity tolerance, Decreased balance, Impaired flexibility, Improper body mechanics, Postural dysfunction, Impaired sensation, Decreased strength, Decreased mobility  Visit Diagnosis: Unsteadiness on feet  Muscle weakness (generalized)  Balance disorder  Other abnormalities of gait and mobility     Problem List Patient Active Problem List   Diagnosis Date Noted   Contracture, left foot 09/20/2020   Neuropathy 09/20/2020   Osteoporosis 09/20/2020   Diabetic ketoacidosis without coma associated with type 2 diabetes mellitus ([place]) 08/11/2020   Seizure ([place]) 08/11/2020   Cerebrovascular disease 08/11/2020   Essential hypertension 08/11/2020   Gastro-esophageal reflux disease without esophagitis 08/11/2020   History of completed stroke 08/11/2020   Hyperglycemia due to type 2 diabetes mellitus  ([place]) 08/11/2020   Mixed hyperlipidemia 08/11/2020   Hyperthyroidism 03/11/2019   Diabetes ([place]) 08/12/2018   Palpitations 04/16/2017   Shortness of breath 04/16/2017   Abnormal INR 01/04/2016   Encounter for therapeutic drug level monitoring 08/31/2015   Long term current use of anticoagulant therapy 08/31/2015   Spastic hemiplegia affecting nondominant side ([place]) 10/15/2012   Myalgia and myositis, unspecified 10/15/2012   Cerebral thrombosis with cerebral infarction ([place]) 07/01/2012   Localization-related (focal) (partial) epilepsy and epileptic syndromes with complex partial seizures, without mention of intractable epilepsy 07/01/2012   Extremity pain 07/01/2012   Partial epilepsy with impairment of consciousness, intractable ([place]) 07/01/2012   CVA (cerebral infarction) 02/11/2012   Contracture of left Achilles tendon 02/11/2012    Lanice Shirts, PT 07/11/2021, 2:44 PM  [facility] [address] [place], [place], [address] Phone: [phone]   Fax:  [phone]  Name: Jacoby Ritsema MRN: [id number] Date of Birth: September 29, 1960

## 2021-07-13 ENCOUNTER — Ambulatory Visit: Payer: Medicare Other

## 2021-07-13 ENCOUNTER — Other Ambulatory Visit: Payer: Self-pay

## 2021-07-13 DIAGNOSIS — M6281 Muscle weakness (generalized): Secondary | ICD-10-CM

## 2021-07-13 DIAGNOSIS — R2681 Unsteadiness on feet: Secondary | ICD-10-CM | POA: Diagnosis not present

## 2021-07-13 DIAGNOSIS — R2689 Other abnormalities of gait and mobility: Secondary | ICD-10-CM

## 2021-07-13 DIAGNOSIS — M25551 Pain in right hip: Secondary | ICD-10-CM | POA: Diagnosis not present

## 2021-07-13 NOTE — Therapy (Signed)
[facility] [address] [place], [place], [address] Phone: [phone]   Fax:  [phone]  Physical Therapy Treatment  Patient Details  Name: Kimberly [name] MRN: [id number] Date of Birth: October 15, 1960 Referring Provider (PT): Trey Sailors, [place]   Encounter Date: 07/13/2021   PT End of Session - 07/13/21 1240     Visit Number 9    Number of Visits 17    Date for PT Re-Evaluation 08/09/21    Authorization Type UHC [place] / MCD    Authorization Time Period 06/07/21-08/10/21    Progress Note Due on Visit 10    PT Start Time 1230    PT Stop Time 1315    PT Time Calculation (min) 45 min    Equipment Utilized During Treatment Other (comment);Gait belt   AFO on LLE   Activity Tolerance Patient tolerated treatment well    Behavior During Therapy [facility] for tasks assessed/performed             Past Medical History:  Diagnosis Date   Diabetes mellitus    Hypertension    Seizures ([place])    last seizure march 2016   Stroke [facility])     Past Surgical History:  Procedure Laterality Date   TRACHEOSTOMY CLOSURE      There were no vitals filed for this visit.   Subjective Assessment - 07/13/21 1238     Subjective Sling has been uncomfortable at times.  Discussed staggering wear times    Patient is accompained by: Family member    Pertinent History CVA 2011    Limitations Standing;Walking;House hold activities    Patient Stated Goals Patient reports she wants to improve walking and stair negotiation    Pain Onset More than a month ago                               [facility] Adult PT Treatment/Exercise - 07/13/21 0001       Transfers   Transfers Sit to Stand;Stand to Sit    Sit to Stand 5: Supervision    Five time sit to stand comments  25.5    Stand to Sit 5: Supervision;4: Min guard    Stand Pivot Transfers 5: Supervision;4: Min guard      Ambulation/Gait   Ambulation/Gait Yes     Ambulation/Gait Assistance 4: Min guard    Ambulation Distance (Feet) 115 Feet    Assistive device Small based quad cane    Gait Pattern Step-to pattern;Decreased arm swing - left;Decreased stride length;Decreased step length - left    Ambulation Surface Level;Indoor    Stairs Yes    Stairs Assistance 2: Max assist    Stair Management Technique One rail Right;Step to pattern    Number of Stairs 1    Gait Comments 1 rest break needed , O2 sats 98%, HR 132 post task                 Balance Exercises - 07/13/21 0001       Balance Exercises: Standing   Stepping Strategy Anterior;Lateral;UE support;Limitations    Stepping Strategy Limitations stepping and WS tasks to allow patient to stabilize on LLE and find midline                PT Education - 07/13/21 1409     Education Details Discussed benefit of new AFO consult to address knee as well as attempts to contact referral  source for Rx    Person(s) Educated Patient;Spouse    Methods Explanation    Comprehension Verbalized understanding              PT Short Term Goals - 07/13/21 1410       PT SHORT TERM GOAL #1   Title Patient will be I with initial HEP to progress with PT    Baseline KMJGLQ8T    Time 4    Period Weeks    Status On-going    Target Date 07/12/21      PT SHORT TERM GOAL #2   Title Patient will report </= 4/10 right hip pain with standing/walking to reduce functional limitation and improve mobility    Baseline 4/10 pain; 07/05/21 reporting no pain over last 2 sessions.    Time 4    Period Weeks    Status Achieved    Target Date 07/13/19      PT SHORT TERM GOAL #3   Title Patient to ambulate 121ft with [facility] and CGA across level ground    Baseline 76ftx2 with QC and CGA; 07/11/21 Able to ambulate 130ft with 1 rest break.    Time 4    Period Weeks    Status On-going    Target Date 07/12/21      PT SHORT TERM GOAL #4   Title Assess BERGand set goal; 07/05/21 BERG goal is 38    Baseline  TBD; BERG score 31    Time 4    Period Weeks    Status Achieved    Target Date 07/12/21      PT SHORT TERM GOAL #5   Title Assess gait speed and set appropriate goal    Baseline TBD; 07/13/21 Gait speed 0.4 ft/s or 0.12.m/s    Time 4    Status New    Target Date 07/12/21               PT Long Term Goals - 06/07/21 1558       PT LONG TERM GOAL #1   Title Patient will be I with final HEP to maintain progress from PT    Baseline KMJGLQ8T    Time 8    Period Weeks    Status New    Target Date 08/09/21      PT LONG TERM GOAL #2   Title Patient will be able to ambulate 63ft using quad cane in order to improve ability to walk to bathroom in apartment, negotiating doorways and perfoming transfer to/from seated position    Baseline 81ft ambulation across open gym floor with CGA    Time 8    Period Weeks    Status New    Target Date 08/09/21      PT LONG TERM GOAL #3   Title Patient will be able to negotiate >/= 6 steps using single rail without assist to improve community access    Baseline TBD    Time 8    Period Weeks    Status New    Target Date 08/09/21      PT LONG TERM GOAL #4   Title Assess progress towards gait speed    Baseline TBD    Time 8    Period Weeks    Status New    Target Date 08/09/21      PT LONG TERM GOAL #5   Title Assess progress towards BERG    Baseline TBD    Time 8    Period Weeks  Status New    Target Date 08/09/21                   Plan - 07/13/21 1502     Clinical Impression Statement Continued gait training attempting to ambulate 115 ft consistently w/o rest break.  Re-assessed stair climbing ability with patient able to ascend 1 step with Min/ModA but Max A needed to decend from step due in part to marked instability and laxity in L knee, not allowing patient to stabilize safely.  Continues to fatigue easily, O2 sats WNL HR elevated to 132 fro baseline of 100BPM.  Assessed 5x STS transfer test.    Personal Factors  and Comorbidities Fitness;Past/Current Experience;Time since onset of injury/illness/exacerbation;Transportation;Comorbidity 3+    Comorbidities CVA, DM, BMI, epilepsy    Examination-Activity Limitations Locomotion Level;Transfers;Reach Overhead;Squat;Stairs;Stand;Carry;Bed Mobility;Bathing;Dressing    Examination-Participation Restrictions Meal Prep;Cleaning;Occupation;Community Activity;Driving;Shop;Laundry;Yard Work    Merchant navy officer Evolving/Moderate complexity    Rehab Potential Fair    PT Frequency 2x / week    PT Duration 8 weeks    PT Treatment/Interventions ADLs/Self Care Home Management;Aquatic Therapy;Cryotherapy;Electrical Stimulation;Iontophoresis 4mg /ml Dexamethasone;Moist Heat;Traction;Ultrasound;DME Instruction;Neuromuscular re-education;Balance training;Therapeutic exercise;Therapeutic activities;Functional mobility training;Stair training;Gait training;Patient/family education;Orthotic Fit/Training;Manual techniques;Passive range of motion;Dry needling;Joint Manipulations;Spinal Manipulations;Vasopneumatic Device;Taping    PT Next Visit Plan 10th visit progress note, assess STGs, f/u on AFO    PT Home Exercise Plan [facility]    Consulted and Agree with Plan of Care Patient             Patient will benefit from skilled therapeutic intervention in order to improve the following deficits and impairments:  Abnormal gait, Decreased range of motion, Difficulty walking, Impaired UE functional use, Pain, Decreased activity tolerance, Decreased balance, Impaired flexibility, Improper body mechanics, Postural dysfunction, Impaired sensation, Decreased strength, Decreased mobility  Visit Diagnosis: Unsteadiness on feet  Muscle weakness (generalized)  Balance disorder     Problem List Patient Active Problem List   Diagnosis Date Noted   Contracture, left foot 09/20/2020   Neuropathy 09/20/2020   Osteoporosis 09/20/2020   Diabetic ketoacidosis without coma  associated with type 2 diabetes mellitus ([place]) 08/11/2020   Seizure ([place]) 08/11/2020   Cerebrovascular disease 08/11/2020   Essential hypertension 08/11/2020   Gastro-esophageal reflux disease without esophagitis 08/11/2020   History of completed stroke 08/11/2020   Hyperglycemia due to type 2 diabetes mellitus ([place]) 08/11/2020   Mixed hyperlipidemia 08/11/2020   Hyperthyroidism 03/11/2019   Diabetes ([place]) 08/12/2018   Palpitations 04/16/2017   Shortness of breath 04/16/2017   Abnormal INR 01/04/2016   Encounter for therapeutic drug level monitoring 08/31/2015   Long term current use of anticoagulant therapy 08/31/2015   Spastic hemiplegia affecting nondominant side ([place]) 10/15/2012   Myalgia and myositis, unspecified 10/15/2012   Cerebral thrombosis with cerebral infarction ([place]) 07/01/2012   Localization-related (focal) (partial) epilepsy and epileptic syndromes with complex partial seizures, without mention of intractable epilepsy 07/01/2012   Extremity pain 07/01/2012   Partial epilepsy with impairment of consciousness, intractable ([place]) 07/01/2012   CVA (cerebral infarction) 02/11/2012   Contracture of left Achilles tendon 02/11/2012    Lanice Shirts, PT 07/13/2021, 3:11 PM  [facility] [address] [place], [place], [address] Phone: [phone]   Fax:  [phone]  Name: Asami Lambright MRN: [id number] Date of Birth: 02-20-1961

## 2021-07-14 NOTE — Progress Notes (Signed)
  Subjective:  Patient ID: Kimberly [name], female    DOB: Jun 25, 1961,  MRN: [id number]  Kimberly [name] presents to clinic today for at risk foot care with history of diabetic neuropathy and painful elongated mycotic toenails 1-5 bilaterally which are tender when wearing enclosed shoe gear. Pain is relieved with periodic professional debridement.  Patient states blood glucose was 184 mg/dl today.  PCP is Kimberly [name], [place] , and last visit was June 02, 2021.  Her husband is present during today's visit. They relate no new pedal issues on today. She is in therapy to assist with strengthening and balance training.  No Known Allergies  Review of Systems: Negative except as noted in the HPI. Objective:   Constitutional Kimberly [name] is a pleasant 60 y.o. African American female, in NAD. AAO x 3.   Vascular Capillary refill time to digits immediate b/l. Palpable pedal pulses b/l LE. Pedal hair absent. Trace edema noted BLE. No cyanosis or clubbing noted.  Neurologic Normal speech. Oriented to person, place, and time. Protective sensation decreased with 10 gram monofilament b/l. Vibratory sensation diminished b/l.  Dermatologic Pedal integument with normal turgor, texture and tone b/l LE. No open wounds b/l. No interdigital macerations b/l. Toenails 1-5 b/l elongated, thickened, discolored with subungual debris. +Tenderness with dorsal palpation of nailplates. No hyperkeratotic or porokeratotic lesions present.  Orthopedic: Normal muscle strength 5/5 to all lower extremity muscle groups bilaterally. No pain, crepitus or joint limitation noted with ROM b/l LE. No gross bony pedal deformities b/l. Patient ambulates independently without assistive aids.   Radiographs: None Assessment:   1. Pain due to onychomycosis of toenails of both feet   2. Diabetic peripheral neuropathy associated with type 2 diabetes mellitus ([place])    Plan:  Patient was evaluated and treated and all questions  answered. Consent given for treatment as described below: -Continue diabetic foot care principles: inspect feet daily, monitor glucose as recommended by PCP and/or Endocrinologist, and follow prescribed diet per PCP, Endocrinologist and/or dietician. -Mycotic toenails 1-5 bilaterally were debrided in length and girth with sterile nail nippers and dremel without incident. -Patient/POA to call should there be question/concern in the interim.  Return in about 3 months (around 10/10/2021).  Kimberly [name], DPM

## 2021-07-17 ENCOUNTER — Other Ambulatory Visit: Payer: Self-pay

## 2021-07-17 ENCOUNTER — Ambulatory Visit: Payer: Medicare Other

## 2021-07-17 DIAGNOSIS — R2681 Unsteadiness on feet: Secondary | ICD-10-CM | POA: Diagnosis not present

## 2021-07-17 DIAGNOSIS — R2689 Other abnormalities of gait and mobility: Secondary | ICD-10-CM

## 2021-07-17 DIAGNOSIS — M6281 Muscle weakness (generalized): Secondary | ICD-10-CM

## 2021-07-17 DIAGNOSIS — M25551 Pain in right hip: Secondary | ICD-10-CM | POA: Diagnosis not present

## 2021-07-17 NOTE — Therapy (Signed)
[facility] [address] [place], [place], [address] Phone: [phone]   Fax:  [phone]  Physical Therapy Treatment/10th Visit Progress Note  Patient Details  Name: Kimberly [name] MRN: [id number] Date of Birth: October 21, 1960 Referring Provider (PT): Trey Sailors, [place]   Encounter Date: 06/07/21-07/17/2021    Dates of Reporting Period:07/17/21  See Note below for Objective Data and Assessment of Progress/Goals.    PT End of Session - 07/17/21 1450     Visit Number 10    Number of Visits 17    Date for PT Re-Evaluation 08/09/21    Authorization Type UHC MRC / MCD    Authorization Time Period 06/07/21-08/10/21    Progress Note Due on Visit 10    PT Start Time 9211    PT Stop Time 1530    PT Time Calculation (min) 45 min    Equipment Utilized During Treatment Other (comment);Gait belt   AFO on LLE   Activity Tolerance Patient tolerated treatment well    Behavior During Therapy [facility] for tasks assessed/performed             Past Medical History:  Diagnosis Date   Diabetes mellitus    Hypertension    Seizures ([place])    last seizure march 2016   Stroke [facility])     Past Surgical History:  Procedure Laterality Date   TRACHEOSTOMY CLOSURE      There were no vitals filed for this visit.   Subjective Assessment - 07/17/21 1453     Subjective Hip pain in R is minimal.    Patient is accompained by: Family member    Pertinent History CVA 2011    Limitations Standing;Walking;House hold activities    Patient Stated Goals Patient reports she wants to improve walking and stair negotiation    Pain Onset More than a month ago                [facility] PT Assessment - 07/17/21 0001       Functional Gait  Assessment   Gait assessed  --    Gait Level Surface --    Change in Gait Speed --    Gait with Horizontal Head Turns --    Gait with Vertical Head Turns --    Gait and Pivot Turn --    Step Over  Obstacle --    Gait with Eyes Closed --    Ambulating Backwards --    Steps --                           [facility] Adult PT Treatment/Exercise - 07/17/21 0001       Ambulation/Gait   Ambulation/Gait Yes    Ambulation/Gait Assistance 5: Supervision;4: Min guard    Ambulation Distance (Feet) 115 Feet    Assistive device Small based quad cane    Gait Pattern Step-to pattern;Decreased arm swing - left;Decreased stride length;Decreased step length - left    Ambulation Surface Indoor;Level    Gait Comments no rest breaks needed today      Knee/Hip Exercises: Sidelying   Hip ABduction Strengthening;Right;Limitations    Hip ABduction Limitations 30 reps with PT assist    Clams R 30x with PT manual facilitation      Manual Therapy   Manual Therapy Soft tissue mobilization;Myofascial release    Manual therapy comments TP release to R piriformis in L sidelie  PT Short Term Goals - 07/17/21 1549       PT SHORT TERM GOAL #1   Title Patient will be I with initial HEP to progress with PT    Baseline [id number]    Time 4    Period Weeks    Status On-going    Target Date 07/12/21      PT SHORT TERM GOAL #2   Title Patient will report </= 4/10 right hip pain with standing/walking to reduce functional limitation and improve mobility    Baseline 4/10 pain; 07/05/21 reporting no pain over last 2 sessions.    Time 4    Period Weeks    Status Achieved    Target Date 07/13/19      PT SHORT TERM GOAL #3   Title Patient to ambulate 121ft with [facility] and CGA across level ground    Baseline 67ftx2 with QC and CGA; 07/11/21 Able to ambulate 184ft with 1 rest break. 07/17/21  1101ft w/o need of rest with [facility]    Time 4    Period Weeks    Status Achieved    Target Date 07/12/21      PT SHORT TERM GOAL #4   Title Assess BERGand set goal; 07/05/21 BERG goal is 38    Baseline TBD; BERG score 31    Time 4    Period Weeks    Status Achieved    Target  Date 07/12/21      PT SHORT TERM GOAL #5   Title Assess gait speed and set appropriate goal    Baseline TBD; 07/13/21 Gait speed 0.4 ft/s or 0.12.m/s    Time 4    Status New    Target Date 07/12/21               PT Long Term Goals - 06/07/21 1558       PT LONG TERM GOAL #1   Title Patient will be I with final HEP to maintain progress from PT    Baseline [id number]    Time 8    Period Weeks    Status New    Target Date 08/09/21      PT LONG TERM GOAL #2   Title Patient will be able to ambulate 17ft using quad cane in order to improve ability to walk to bathroom in apartment, negotiating doorways and perfoming transfer to/from seated position    Baseline 31ft ambulation across open gym floor with CGA    Time 8    Period Weeks    Status New    Target Date 08/09/21      PT LONG TERM GOAL #3   Title Patient will be able to negotiate >/= 6 steps using single rail without assist to improve community access    Baseline TBD    Time 8    Period Weeks    Status New    Target Date 08/09/21      PT LONG TERM GOAL #4   Title Assess progress towards gait speed    Baseline TBD    Time 8    Period Weeks    Status New    Target Date 08/09/21      PT LONG TERM GOAL #5   Title Assess progress towards BERG    Baseline TBD    Time 8    Period Weeks    Status New    Target Date 08/09/21  Plan - 07/17/21 1450     Clinical Impression Statement Progress towards goals assessed today and progress note written.  L knee instability remains as primary limiting factor preventing her from WB onto LLE to perform SLS or stepping tasks.  Request for AFO consult as been requested    Personal Factors and Comorbidities Fitness;Past/Current Experience;Time since onset of injury/illness/exacerbation;Transportation;Comorbidity 3+    Comorbidities CVA, DM, BMI, epilepsy    Examination-Activity Limitations Locomotion Level;Transfers;Reach  Overhead;Squat;Stairs;Stand;Carry;Bed Mobility;Bathing;Dressing    Examination-Participation Restrictions Meal Prep;Cleaning;Occupation;Community Activity;Driving;Shop;Laundry;Yard Work    [employer] Evolving/Moderate complexity    Rehab Potential Fair    PT Frequency 2x / week    PT Duration 8 weeks    PT Treatment/Interventions ADLs/Self Care Home Management;Aquatic Therapy;Cryotherapy;Electrical Stimulation;Iontophoresis 4mg /ml Dexamethasone;Moist Heat;Traction;Ultrasound;DME Instruction;Neuromuscular re-education;Balance training;Therapeutic exercise;Therapeutic activities;Functional mobility training;Stair training;Gait training;Patient/family education;Orthotic Fit/Training;Manual techniques;Passive range of motion;Dry needling;Joint Manipulations;Spinal Manipulations;Vasopneumatic Device;Taping    PT Next Visit Plan 10th visit progress note, assess STGs, f/u on AFO    PT Home Exercise Plan [id number]    Consulted and Agree with Plan of Care Patient             Patient will benefit from skilled therapeutic intervention in order to improve the following deficits and impairments:  Abnormal gait, Decreased range of motion, Difficulty walking, Impaired UE functional use, Pain, Decreased activity tolerance, Decreased balance, Impaired flexibility, Improper body mechanics, Postural dysfunction, Impaired sensation, Decreased strength, Decreased mobility  Visit Diagnosis: Unsteadiness on feet  Muscle weakness (generalized)  Balance disorder  Other abnormalities of gait and mobility     Problem List Patient Active Problem List   Diagnosis Date Noted   Contracture, left foot 09/20/2020   Neuropathy 09/20/2020   Osteoporosis 09/20/2020   Diabetic ketoacidosis without coma associated with type 2 diabetes mellitus ([place]) 08/11/2020   Seizure ([place]) 08/11/2020   Cerebrovascular disease 08/11/2020   Essential hypertension 08/11/2020   Gastro-esophageal reflux  disease without esophagitis 08/11/2020   History of completed stroke 08/11/2020   Hyperglycemia due to type 2 diabetes mellitus ([place]) 08/11/2020   Mixed hyperlipidemia 08/11/2020   Hyperthyroidism 03/11/2019   Diabetes ([place]) 08/12/2018   Palpitations 04/16/2017   Shortness of breath 04/16/2017   Abnormal INR 01/04/2016   Encounter for therapeutic drug level monitoring 08/31/2015   Long term current use of anticoagulant therapy 08/31/2015   Spastic hemiplegia affecting nondominant side ([place]) 10/15/2012   Myalgia and myositis, unspecified 10/15/2012   Cerebral thrombosis with cerebral infarction ([place]) 07/01/2012   Localization-related (focal) (partial) epilepsy and epileptic syndromes with complex partial seizures, without mention of intractable epilepsy 07/01/2012   Extremity pain 07/01/2012   Partial epilepsy with impairment of consciousness, intractable ([place]) 07/01/2012   CVA (cerebral infarction) 02/11/2012   Contracture of left Achilles tendon 02/11/2012    Lanice Shirts, PT 07/17/2021, 4:19 PM  [facility] [address] [place], [place], [address] Phone: [phone]   Fax:  [phone]  Name: Kimberly [name] MRN: [id number] Date of Birth: 25-Jul-1961

## 2021-07-19 ENCOUNTER — Other Ambulatory Visit: Payer: Self-pay

## 2021-07-19 ENCOUNTER — Ambulatory Visit: Payer: Medicare Other

## 2021-07-19 DIAGNOSIS — M25551 Pain in right hip: Secondary | ICD-10-CM | POA: Diagnosis not present

## 2021-07-19 DIAGNOSIS — R2681 Unsteadiness on feet: Secondary | ICD-10-CM

## 2021-07-19 DIAGNOSIS — M6281 Muscle weakness (generalized): Secondary | ICD-10-CM | POA: Diagnosis not present

## 2021-07-19 DIAGNOSIS — R2689 Other abnormalities of gait and mobility: Secondary | ICD-10-CM

## 2021-07-19 NOTE — Therapy (Signed)
[facility] [address] [place], [place], [address] Phone: [phone]   Fax:  [phone]  Physical Therapy Treatment  Patient Details  Name: Kimberly [name] MRN: [id number] Date of Birth: [birth year] Referring Provider (PT): Trey Sailors, [place]   Encounter Date: 07/19/2021   PT End of Session - 07/19/21 1419     Visit Number 11    Number of Visits 17    Date for PT Re-Evaluation 08/09/21    Authorization Type UHC MRC / MCD    Authorization Time Period 06/07/21-08/10/21    Progress Note Due on Visit 10    PT Start Time 1320    PT Stop Time 1400    PT Time Calculation (min) 40 min    Equipment Utilized During Treatment Other (comment);Gait belt   AFO on LLE   Activity Tolerance Patient tolerated treatment well    Behavior During Therapy [facility] for tasks assessed/performed             Past Medical History:  Diagnosis Date   Diabetes mellitus    Hypertension    Seizures ([place])    last seizure march 2016   Stroke [facility])     Past Surgical History:  Procedure Laterality Date   TRACHEOSTOMY CLOSURE      There were no vitals filed for this visit.   Subjective Assessment - 07/19/21 1418     Subjective Less R hip pain and discomfort following last session    Patient is accompained by: Family member    Pertinent History CVA 2011    Limitations Standing;Walking;House hold activities    Patient Stated Goals Patient reports she wants to improve walking and stair negotiation    Pain Onset More than a month ago                               [facility] Adult PT Treatment/Exercise - 07/19/21 0001       Transfers   Transfers Sit to Stand;Stand to Sit    Sit to Stand 5: Supervision    Stand to Sit 5: Supervision;4: Min guard    Stand Pivot Transfers 5: Supervision;4: Min guard      Knee/Hip Exercises: Supine   Bridges Strengthening;Right;2 sets;15 reps;Limitations    Bridges Limitations PT  assist to stabilize LLE      Knee/Hip Exercises: Sidelying   Hip ABduction Strengthening;Right;Limitations    Hip ABduction Limitations 30 reps with PT assist    Clams R 30x with PT manual facilitation      Manual Therapy   Manual Therapy Soft tissue mobilization;Myofascial release    Manual therapy comments TP release to R piriformis in L sidelie, 8 min duration                 Balance Exercises - 07/19/21 0001       Balance Exercises: Standing   Stepping Strategy Anterior;UE support;5 reps;Limitations    Stepping Strategy Limitations in // bars, stepping onto and off of a 4" block with RUE support, SBA to block L knee as needed                  PT Short Term Goals - 07/17/21 1549       PT SHORT TERM GOAL #1   Title Patient will be I with initial HEP to progress with PT    Baseline [id number]    Time 4    Period  Weeks    Status On-going    Target Date 07/12/21      PT SHORT TERM GOAL #2   Title Patient will report </= 4/10 right hip pain with standing/walking to reduce functional limitation and improve mobility    Baseline 4/10 pain; 07/05/21 reporting no pain over last 2 sessions.    Time 4    Period Weeks    Status Achieved    Target Date 07/13/19      PT SHORT TERM GOAL #3   Title Patient to ambulate 180ft with [facility] and CGA across level ground    Baseline 71ftx2 with QC and CGA; 07/11/21 Able to ambulate 140ft with 1 rest break. 07/17/21  123ft w/o need of rest with [facility]    Time 4    Period Weeks    Status Achieved    Target Date 07/12/21      PT SHORT TERM GOAL #4   Title Assess BERGand set goal; 07/05/21 BERG goal is 38    Baseline TBD; BERG score 31    Time 4    Period Weeks    Status Achieved    Target Date 07/12/21      PT SHORT TERM GOAL #5   Title Assess gait speed and set appropriate goal    Baseline TBD; 07/13/21 Gait speed 0.4 ft/s or 0.12.m/s    Time 4    Status New    Target Date 07/12/21               PT Long Term  Goals - 06/07/21 1558       PT LONG TERM GOAL #1   Title Patient will be I with final HEP to maintain progress from PT    Baseline [id number]    Time 8    Period Weeks    Status New    Target Date 08/09/21      PT LONG TERM GOAL #2   Title Patient will be able to ambulate 60ft using quad cane in order to improve ability to walk to bathroom in apartment, negotiating doorways and perfoming transfer to/from seated position    Baseline 69ft ambulation across open gym floor with CGA    Time 8    Period Weeks    Status New    Target Date 08/09/21      PT LONG TERM GOAL #3   Title Patient will be able to negotiate >/= 6 steps using single rail without assist to improve community access    Baseline TBD    Time 8    Period Weeks    Status New    Target Date 08/09/21      PT LONG TERM GOAL #4   Title Assess progress towards gait speed    Baseline TBD    Time 8    Period Weeks    Status New    Target Date 08/09/21      PT LONG TERM GOAL #5   Title Assess progress towards BERG    Baseline TBD    Time 8    Period Weeks    Status New    Target Date 08/09/21                   Plan - 07/19/21 1433     Clinical Impression Statement Continued soft tissue work to R gluteal/piriformis region forTP release follwed by R hip strengthening in L sidelie with PT maintaining proper position of RLE.  Stepping tasks in //  bars stepping onto and off of a 4" block with RUE support with empasis placed on LLE placement and TCs to shift weight L for proper L foot placement    Personal Factors and Comorbidities Fitness;Past/Current Experience;Time since onset of injury/illness/exacerbation;Transportation;Comorbidity 3+    Comorbidities CVA, DM, BMI, epilepsy    Examination-Activity Limitations Locomotion Level;Transfers;Reach Overhead;Squat;Stairs;Stand;Carry;Bed Mobility;Bathing;Dressing    Examination-Participation Restrictions Meal Prep;Cleaning;Occupation;Community  Activity;Driving;Shop;Laundry;Yard Work    [employer] officer Evolving/Moderate complexity    Rehab Potential Fair    PT Frequency 2x / week    PT Duration 8 weeks    PT Treatment/Interventions ADLs/Self Care Home Management;Aquatic Therapy;Cryotherapy;Electrical Stimulation;Iontophoresis 4mg /ml Dexamethasone;Moist Heat;Traction;Ultrasound;DME Instruction;Neuromuscular re-education;Balance training;Therapeutic exercise;Therapeutic activities;Functional mobility training;Stair training;Gait training;Patient/family education;Orthotic Fit/Training;Manual techniques;Passive range of motion;Dry needling;Joint Manipulations;Spinal Manipulations;Vasopneumatic Device;Taping    PT Next Visit Plan f/u on AFO, R hip strengthening and stepping tasks    PT Home Exercise Plan [id number]    Consulted and Agree with Plan of Care Patient             Patient will benefit from skilled therapeutic intervention in order to improve the following deficits and impairments:  Abnormal gait, Decreased range of motion, Difficulty walking, Impaired UE functional use, Pain, Decreased activity tolerance, Decreased balance, Impaired flexibility, Improper body mechanics, Postural dysfunction, Impaired sensation, Decreased strength, Decreased mobility  Visit Diagnosis: Unsteadiness on feet  Muscle weakness (generalized)  Balance disorder  Other abnormalities of gait and mobility     Problem List Patient Active Problem List   Diagnosis Date Noted   Contracture, left foot 09/20/2020   Neuropathy 09/20/2020   Osteoporosis 09/20/2020   Diabetic ketoacidosis without coma associated with type 2 diabetes mellitus ([place]) 08/11/2020   Seizure ([place]) 08/11/2020   Cerebrovascular disease 08/11/2020   Essential hypertension 08/11/2020   Gastro-esophageal reflux disease without esophagitis 08/11/2020   History of completed stroke 08/11/2020   Hyperglycemia due to type 2 diabetes mellitus ([place]) 08/11/2020    Mixed hyperlipidemia 08/11/2020   Hyperthyroidism 03/11/2019   Diabetes ([place]) 08/12/2018   Palpitations 04/16/2017   Shortness of breath 04/16/2017   Abnormal INR 01/04/2016   Encounter for therapeutic drug level monitoring 08/31/2015   Long term current use of anticoagulant therapy 08/31/2015   Spastic hemiplegia affecting nondominant side ([place]) 10/15/2012   Myalgia and myositis, unspecified 10/15/2012   Cerebral thrombosis with cerebral infarction ([place]) 07/01/2012   Localization-related (focal) (partial) epilepsy and epileptic syndromes with complex partial seizures, without mention of intractable epilepsy 07/01/2012   Extremity pain 07/01/2012   Partial epilepsy with impairment of consciousness, intractable ([place]) 07/01/2012   CVA (cerebral infarction) 02/11/2012   Contracture of left Achilles tendon 02/11/2012    Lanice Shirts, PT 07/19/2021, 2:38 PM  [facility] [address] [place], [place], [address] Phone: [phone]   Fax:  [phone]  Name: Maicy Filip MRN: [id number] Date of Birth: [birth year]

## 2021-07-24 ENCOUNTER — Other Ambulatory Visit: Payer: Self-pay

## 2021-07-24 ENCOUNTER — Ambulatory Visit: Payer: Medicare Other

## 2021-07-24 DIAGNOSIS — R2689 Other abnormalities of gait and mobility: Secondary | ICD-10-CM

## 2021-07-24 DIAGNOSIS — R2681 Unsteadiness on feet: Secondary | ICD-10-CM | POA: Diagnosis not present

## 2021-07-24 DIAGNOSIS — M6281 Muscle weakness (generalized): Secondary | ICD-10-CM | POA: Diagnosis not present

## 2021-07-24 DIAGNOSIS — M25551 Pain in right hip: Secondary | ICD-10-CM | POA: Diagnosis not present

## 2021-07-24 NOTE — Therapy (Addendum)
[facility] [address] [place], [place], [address] Phone: [phone]   Fax:  [phone]  Physical Therapy Treatment  Patient Details  Name: Kimberly [name] MRN: [id number] Date of Birth: 1960-11-13 Referring Provider (PT): Trey Sailors, [place]   Encounter Date: 07/24/2021   PT End of Session - 07/24/21 1359     Visit Number 13    Number of Visits 17    Date for PT Re-Evaluation 08/09/21    Authorization Type UHC MRC / MCD    Authorization Time Period 06/07/21-08/10/21    Progress Note Due on Visit 20    PT Start Time 1320    PT Stop Time 1400    PT Time Calculation (min) 40 min    Equipment Utilized During Treatment Other (comment);Gait belt    Activity Tolerance Patient tolerated treatment well    Behavior During Therapy [facility] for tasks assessed/performed             Past Medical History:  Diagnosis Date   Diabetes mellitus    Hypertension    Seizures ([place])    last seizure march 2016   Stroke [facility])     Past Surgical History:  Procedure Laterality Date   TRACHEOSTOMY CLOSURE      There were no vitals filed for this visit.   Subjective Assessment - 07/24/21 1318     Subjective No falls to note, was able to negotiate 4-5 steps with rail and assist of 2-3 CGs    Patient is accompained by: Family member    Pertinent History CVA 2011    Limitations Standing;Walking;House hold activities    Patient Stated Goals Patient reports she wants to improve walking and stair negotiation    Pain Onset More than a month ago                               [facility] Adult PT Treatment/Exercise - 07/24/21 0001       Transfers   Transfers Sit to Stand;Stand to Sit    Sit to Stand 5: Supervision    Stand to Sit 5: Supervision;4: Min guard    Stand Pivot Transfers 5: Supervision;4: Min guard      Knee/Hip Exercises: Supine   Bridges Strengthening;Right;2 sets;15 reps;Limitations    Bridges  Limitations PT assist to stabilize LLE      Knee/Hip Exercises: Sidelying   Hip ABduction Strengthening;Right;Limitations    Hip ABduction Limitations 30 reps with PT assist    Clams R 30x with PT manual facilitation      Manual Therapy   Manual Therapy Soft tissue mobilization;Myofascial release    Manual therapy comments TP release to R piriformis in L sidelie, 8 min duration                 Balance Exercises - 07/24/21 0001       Balance Exercises: Standing   Stepping Strategy Anterior;UE support;Limitations    Stepping Strategy Limitations in // bars, stepping onto and off of a 4" block with RUE support, SBA to block L knee as needed, 8 reps with 3 missteps due to improper LE sequencing                  PT Short Term Goals - 07/24/21 1408       PT SHORT TERM GOAL #1   Title Patient will be I with initial HEP to progress with PT  Baseline [id number]    Time 4    Period Weeks    Status On-going    Target Date 07/12/21      PT SHORT TERM GOAL #2   Title Patient will report </= 4/10 right hip pain with standing/walking to reduce functional limitation and improve mobility    Baseline 4/10 pain; 07/05/21 reporting no pain over last 2 sessions.    Time 4    Period Weeks    Status Achieved    Target Date 07/13/19      PT SHORT TERM GOAL #3   Title Patient to ambulate 133ft with [facility] and CGA across level ground    Baseline 49ftx2 with QC and CGA; 07/11/21 Able to ambulate 177ft with 1 rest break. 07/17/21  140ft w/o need of rest with [facility]    Time 4    Period Weeks    Status Achieved    Target Date 07/12/21      PT SHORT TERM GOAL #4   Title Assess BERGand set goal; 07/05/21 BERG goal is 38    Baseline TBD; BERG score 31    Time 4    Period Weeks    Status Achieved    Target Date 07/12/21      PT SHORT TERM GOAL #5   Title Assess gait speed and set appropriate goal.  07/24/21 goal is 0.6 m/s    Baseline TBD; 07/13/21 Gait speed 0.4 ft/s or 0.12.m/s     Time 4    Status Achieved    Target Date 07/12/21               PT Long Term Goals - 06/07/21 1558       PT LONG TERM GOAL #1   Title Patient will be I with final HEP to maintain progress from PT    Baseline [id number]    Time 8    Period Weeks    Status New    Target Date 08/09/21      PT LONG TERM GOAL #2   Title Patient will be able to ambulate 84ft using quad cane in order to improve ability to walk to bathroom in apartment, negotiating doorways and perfoming transfer to/from seated position    Baseline 35ft ambulation across open gym floor with CGA    Time 8    Period Weeks    Status New    Target Date 08/09/21      PT LONG TERM GOAL #3   Title Patient will be able to negotiate >/= 6 steps using single rail without assist to improve community access    Baseline TBD    Time 8    Period Weeks    Status New    Target Date 08/09/21      PT LONG TERM GOAL #4   Title Assess progress towards gait speed    Baseline TBD    Time 8    Period Weeks    Status New    Target Date 08/09/21      PT LONG TERM GOAL #5   Title Assess progress towards BERG    Baseline TBD    Time 8    Period Weeks    Status New    Target Date 08/09/21                   Plan - 07/24/21 1403     Clinical Impression Statement Less tenderness/soft tissue irritation to R piriformis region, gluteus medius strength improving but  abduction strength still deficient to work against gravity.  Continued stepping tasks with patient showing increased proficiency today able to complete 8 reps with 3 missteps on sequencing.    Personal Factors and Comorbidities Fitness;Past/Current Experience;Time since onset of injury/illness/exacerbation;Transportation;Comorbidity 3+    Comorbidities CVA, DM, BMI, epilepsy    Examination-Activity Limitations Locomotion Level;Transfers;Reach Overhead;Squat;Stairs;Stand;Carry;Bed Mobility;Bathing;Dressing    Examination-Participation Restrictions Meal  Prep;Cleaning;Occupation;Community Activity;Driving;Shop;Laundry;Yard Work    [employer] officer Evolving/Moderate complexity    Rehab Potential Fair    PT Frequency 2x / week    PT Duration 8 weeks    PT Treatment/Interventions ADLs/Self Care Home Management;Aquatic Therapy;Cryotherapy;Electrical Stimulation;Iontophoresis 4mg /ml Dexamethasone;Moist Heat;Traction;Ultrasound;DME Instruction;Neuromuscular re-education;Balance training;Therapeutic exercise;Therapeutic activities;Functional mobility training;Stair training;Gait training;Patient/family education;Orthotic Fit/Training;Manual techniques;Passive range of motion;Dry needling;Joint Manipulations;Spinal Manipulations;Vasopneumatic Device;Taping    PT Next Visit Plan f/u on AFO, R hip strengthening and stepping tasks, advance to 6" block?    PT Home Exercise Plan [id number]    Consulted and Agree with Plan of Care Patient             Patient will benefit from skilled therapeutic intervention in order to improve the following deficits and impairments:  Abnormal gait, Decreased range of motion, Difficulty walking, Impaired UE functional use, Pain, Decreased activity tolerance, Decreased balance, Impaired flexibility, Improper body mechanics, Postural dysfunction, Impaired sensation, Decreased strength, Decreased mobility  Visit Diagnosis: Unsteadiness on feet  Muscle weakness (generalized)  Balance disorder     Problem List Patient Active Problem List   Diagnosis Date Noted   Contracture, left foot 09/20/2020   Neuropathy 09/20/2020   Osteoporosis 09/20/2020   Diabetic ketoacidosis without coma associated with type 2 diabetes mellitus ([place]) 08/11/2020   Seizure ([place]) 08/11/2020   Cerebrovascular disease 08/11/2020   Essential hypertension 08/11/2020   Gastro-esophageal reflux disease without esophagitis 08/11/2020   History of completed stroke 08/11/2020   Hyperglycemia due to type 2 diabetes mellitus ([place])  08/11/2020   Mixed hyperlipidemia 08/11/2020   Hyperthyroidism 03/11/2019   Diabetes ([place]) 08/12/2018   Palpitations 04/16/2017   Shortness of breath 04/16/2017   Abnormal INR 01/04/2016   Encounter for therapeutic drug level monitoring 08/31/2015   Long term current use of anticoagulant therapy 08/31/2015   Spastic hemiplegia affecting nondominant side ([place]) 10/15/2012   Myalgia and myositis, unspecified 10/15/2012   Cerebral thrombosis with cerebral infarction ([place]) 07/01/2012   Localization-related (focal) (partial) epilepsy and epileptic syndromes with complex partial seizures, without mention of intractable epilepsy 07/01/2012   Extremity pain 07/01/2012   Partial epilepsy with impairment of consciousness, intractable ([place]) 07/01/2012   CVA (cerebral infarction) 02/11/2012   Contracture of left Achilles tendon 02/11/2012    Lanice Shirts, PT 07/24/2021, 2:19 PM  [facility] [address] [place], [place], [address] Phone: [phone]   Fax:  [phone]  Name: Mollie Rossano MRN: [id number] Date of Birth: 26-Nov-1960

## 2021-07-25 DIAGNOSIS — Z7901 Long term (current) use of anticoagulants: Secondary | ICD-10-CM | POA: Diagnosis not present

## 2021-07-25 DIAGNOSIS — I6330 Cerebral infarction due to thrombosis of unspecified cerebral artery: Secondary | ICD-10-CM | POA: Diagnosis not present

## 2021-07-25 DIAGNOSIS — Z5181 Encounter for therapeutic drug level monitoring: Secondary | ICD-10-CM | POA: Diagnosis not present

## 2021-07-26 DIAGNOSIS — I69053 Hemiplegia and hemiparesis following nontraumatic subarachnoid hemorrhage affecting right non-dominant side: Secondary | ICD-10-CM | POA: Diagnosis not present

## 2021-07-26 DIAGNOSIS — I6789 Other cerebrovascular disease: Secondary | ICD-10-CM | POA: Diagnosis not present

## 2021-07-26 DIAGNOSIS — E1165 Type 2 diabetes mellitus with hyperglycemia: Secondary | ICD-10-CM | POA: Diagnosis not present

## 2021-07-26 DIAGNOSIS — E782 Mixed hyperlipidemia: Secondary | ICD-10-CM | POA: Diagnosis not present

## 2021-07-26 DIAGNOSIS — R569 Unspecified convulsions: Secondary | ICD-10-CM | POA: Diagnosis not present

## 2021-07-26 DIAGNOSIS — I10 Essential (primary) hypertension: Secondary | ICD-10-CM | POA: Diagnosis not present

## 2021-07-27 ENCOUNTER — Ambulatory Visit: Payer: Medicare Other | Attending: Physician Assistant

## 2021-07-27 ENCOUNTER — Other Ambulatory Visit: Payer: Self-pay

## 2021-07-27 DIAGNOSIS — R293 Abnormal posture: Secondary | ICD-10-CM | POA: Insufficient documentation

## 2021-07-27 DIAGNOSIS — M25551 Pain in right hip: Secondary | ICD-10-CM | POA: Insufficient documentation

## 2021-07-27 DIAGNOSIS — M6281 Muscle weakness (generalized): Secondary | ICD-10-CM | POA: Diagnosis not present

## 2021-07-27 DIAGNOSIS — R2689 Other abnormalities of gait and mobility: Secondary | ICD-10-CM | POA: Diagnosis not present

## 2021-07-27 DIAGNOSIS — R2681 Unsteadiness on feet: Secondary | ICD-10-CM | POA: Diagnosis not present

## 2021-07-27 NOTE — Therapy (Signed)
[facility] [address] [place], [place], [address] Phone: [phone]   Fax:  [phone]  Physical Therapy Treatment  Patient Details  Name: Kimberly [name] MRN: [id number] Date of Birth: July 13, 1961 Referring Provider (PT): Trey Sailors, [place]   Encounter Date: 07/27/2021   PT End of Session - 07/27/21 1412     Visit Number 14    Number of Visits 17    Date for PT Re-Evaluation 08/09/21    Authorization Type UHC MRC / MCD    Authorization Time Period 06/07/21-08/10/21    Progress Note Due on Visit 20    PT Start Time 1320    PT Stop Time 1400    PT Time Calculation (min) 40 min    Equipment Utilized During Treatment Other (comment);Gait belt    Activity Tolerance Patient tolerated treatment well    Behavior During Therapy [facility] for tasks assessed/performed             Past Medical History:  Diagnosis Date   Diabetes mellitus    Hypertension    Seizures ([place])    last seizure march 2016   Stroke [facility])     Past Surgical History:  Procedure Laterality Date   TRACHEOSTOMY CLOSURE      There were no vitals filed for this visit.   Subjective Assessment - 07/27/21 1408     Subjective No changes to report, saw MD ans will agree to AFO consult    Patient is accompained by: Family member    Pertinent History CVA 2011    Limitations Standing;Walking;House hold activities    Patient Stated Goals Patient reports she wants to improve walking and stair negotiation    Pain Onset More than a month ago                               [facility] Adult PT Treatment/Exercise - 07/27/21 0001       Transfers   Transfers Sit to Stand;Stand to Sit    Sit to Stand 5: Supervision    Stand to Sit 5: Supervision;4: Min guard    Stand Pivot Transfers 5: Supervision;4: Min guard      Knee/Hip Exercises: Supine   Bridges Strengthening;Right;2 sets;15 reps;Limitations    Bridges Limitations PT assist to  stabilize LLE      Knee/Hip Exercises: Sidelying   Hip ABduction Strengthening;Right;Limitations    Hip ABduction Limitations 30 reps with PT assist    Clams R 30x with PT manual facilitation      Manual Therapy   Manual Therapy Soft tissue mobilization;Myofascial release    Manual therapy comments TP release to R piriformis in L sidelie, 8 min duration                 Balance Exercises - 07/27/21 0001       Balance Exercises: Standing   Stepping Strategy Anterior;UE support;Limitations;10 reps    Stepping Strategy Limitations in // bars, stepping onto and off of a 6" block with RUE support, SBA to block L knee as needed, 10 reps with 1 missteps due to improper LE sequencing                  PT Short Term Goals - 07/27/21 1420       PT SHORT TERM GOAL #1   Title Patient will be I with initial HEP to progress with PT    Baseline [id number];  07/27/21 Verbally reviewed and compliant    Time 4    Period Weeks    Status Achieved    Target Date 07/12/21      PT SHORT TERM GOAL #2   Title Patient will report </= 4/10 right hip pain with standing/walking to reduce functional limitation and improve mobility    Baseline 4/10 pain; 07/05/21 reporting no pain over last 2 sessions.    Time 4    Period Weeks    Status Achieved    Target Date 07/13/19      PT SHORT TERM GOAL #3   Title Patient to ambulate 143ft with [facility]: [facility] and CGA across level ground    Baseline 36ftx2 with QC and CGA; 07/11/21 Able to ambulate 129ft with 1 rest break. 07/17/21  125ft w/o need of rest with [facility]    Time 4    Period Weeks    Status Achieved    Target Date 07/12/21      PT SHORT TERM GOAL #4   Title Assess BERGand set goal; 07/05/21 BERG goal is 38    Baseline TBD; BERG score 31    Time 4    Period Weeks    Status Achieved    Target Date 07/12/21      PT SHORT TERM GOAL #5   Title Assess gait speed and set appropriate goal.  07/24/21 goal is 0.6 m/s    Baseline TBD; 07/13/21 Gait  speed 0.4 ft/s or 0.12.m/s    Time 4    Status Achieved    Target Date 07/12/21               PT Long Term Goals - 06/07/21 1558       PT LONG TERM GOAL #1   Title Patient will be I with final HEP to maintain progress from PT    Baseline [id number]    Time 8    Period Weeks    Status New    Target Date 08/09/21      PT LONG TERM GOAL #2   Title Patient will be able to ambulate 78ft using quad cane in order to improve ability to walk to bathroom in apartment, negotiating doorways and perfoming transfer to/from seated position    Baseline 62ft ambulation across open gym floor with CGA    Time 8    Period Weeks    Status New    Target Date 08/09/21      PT LONG TERM GOAL #3   Title Patient will be able to negotiate >/= 6 steps using single rail without assist to improve community access    Baseline TBD    Time 8    Period Weeks    Status New    Target Date 08/09/21      PT LONG TERM GOAL #4   Title Assess progress towards gait speed    Baseline TBD    Time 8    Period Weeks    Status New    Target Date 08/09/21      PT LONG TERM GOAL #5   Title Assess progress towards BERG    Baseline TBD    Time 8    Period Weeks    Status New    Target Date 08/09/21                   Plan - 07/27/21 1414     Clinical Impression Statement R piriformis tenderness decreased by 50%+, with little ot no  pain with activity outside of clinic.  Abductor strength improving requiring increased manual resistance to challenge patient.  Advanced to 6" step in // bars with patient able to complete 10 step ups with RUE support and tactile assist for LLE placement on step off.  Will send request for AFO to Dr. Vista Lawman.    Personal Factors and Comorbidities Fitness;Past/Current Experience;Time since onset of injury/illness/exacerbation;Transportation;Comorbidity 3+    Comorbidities CVA, DM, BMI, epilepsy    Examination-Activity Limitations Locomotion Level;Transfers;Reach  Overhead;Squat;Stairs;Stand;Carry;Bed Mobility;Bathing;Dressing    Examination-Participation Restrictions Meal Prep;Cleaning;Occupation;Community Activity;Driving;Shop;Laundry;Yard Work    [employer] officer Evolving/Moderate complexity    Rehab Potential Fair    PT Frequency 2x / week    PT Duration 8 weeks    PT Treatment/Interventions ADLs/Self Care Home Management;Aquatic Therapy;Cryotherapy;Electrical Stimulation;Iontophoresis 4mg /ml Dexamethasone;Moist Heat;Traction;Ultrasound;DME Instruction;Neuromuscular re-education;Balance training;Therapeutic exercise;Therapeutic activities;Functional mobility training;Stair training;Gait training;Patient/family education;Orthotic Fit/Training;Manual techniques;Passive range of motion;Dry needling;Joint Manipulations;Spinal Manipulations;Vasopneumatic Device;Taping    PT Next Visit Plan Continue R hip strength, stepping onto 6" block and transitioning to 4 steps, consider pausing PT until AFO obtained?    PT Home Exercise Plan [id number]    Consulted and Agree with Plan of Care Patient             Patient will benefit from skilled therapeutic intervention in order to improve the following deficits and impairments:  Abnormal gait, Decreased range of motion, Difficulty walking, Impaired UE functional use, Pain, Decreased activity tolerance, Decreased balance, Impaired flexibility, Improper body mechanics, Postural dysfunction, Impaired sensation, Decreased strength, Decreased mobility  Visit Diagnosis: Unsteadiness on feet  Muscle weakness (generalized)  Balance disorder  Pain in right hip     Problem List Patient Active Problem List   Diagnosis Date Noted   Contracture, left foot 09/20/2020   Neuropathy 09/20/2020   Osteoporosis 09/20/2020   Diabetic ketoacidosis without coma associated with type 2 diabetes mellitus ([place]) 08/11/2020   Seizure ([place]) 08/11/2020   Cerebrovascular disease 08/11/2020   Essential hypertension  08/11/2020   Gastro-esophageal reflux disease without esophagitis 08/11/2020   History of completed stroke 08/11/2020   Hyperglycemia due to type 2 diabetes mellitus ([place]) 08/11/2020   Mixed hyperlipidemia 08/11/2020   Hyperthyroidism 03/11/2019   Diabetes ([place]) 08/12/2018   Palpitations 04/16/2017   Shortness of breath 04/16/2017   Abnormal INR 01/04/2016   Encounter for therapeutic drug level monitoring 08/31/2015   Long term current use of anticoagulant therapy 08/31/2015   Spastic hemiplegia affecting nondominant side ([place]) 10/15/2012   Myalgia and myositis, unspecified 10/15/2012   Cerebral thrombosis with cerebral infarction ([place]) 07/01/2012   Localization-related (focal) (partial) epilepsy and epileptic syndromes with complex partial seizures, without mention of intractable epilepsy 07/01/2012   Extremity pain 07/01/2012   Partial epilepsy with impairment of consciousness, intractable ([place]) 07/01/2012   CVA (cerebral infarction) 02/11/2012   Contracture of left Achilles tendon 02/11/2012    Lanice Shirts, PT 07/27/2021, 2:26 PM  [facility] [address] [place], [place], [address] Phone: [phone]   Fax:  [phone]  Name: Kimberly [name] MRN: [id number] Date of Birth: 30-Jan-1961

## 2021-07-31 ENCOUNTER — Other Ambulatory Visit: Payer: Self-pay

## 2021-07-31 ENCOUNTER — Telehealth: Payer: Self-pay | Admitting: Physical Therapy

## 2021-07-31 ENCOUNTER — Ambulatory Visit: Payer: Medicare Other | Admitting: Physical Therapy

## 2021-07-31 ENCOUNTER — Encounter: Payer: Self-pay | Admitting: Physical Therapy

## 2021-07-31 ENCOUNTER — Ambulatory Visit: Payer: Medicare Other

## 2021-07-31 DIAGNOSIS — R2689 Other abnormalities of gait and mobility: Secondary | ICD-10-CM | POA: Diagnosis not present

## 2021-07-31 DIAGNOSIS — R293 Abnormal posture: Secondary | ICD-10-CM | POA: Diagnosis not present

## 2021-07-31 DIAGNOSIS — M6281 Muscle weakness (generalized): Secondary | ICD-10-CM | POA: Diagnosis not present

## 2021-07-31 DIAGNOSIS — R2681 Unsteadiness on feet: Secondary | ICD-10-CM | POA: Diagnosis not present

## 2021-07-31 DIAGNOSIS — M25551 Pain in right hip: Secondary | ICD-10-CM | POA: Diagnosis not present

## 2021-07-31 NOTE — Therapy (Signed)
[facility] [address] [place], [place], [address] Phone: [phone]   Fax:  [phone]  Physical Therapy Treatment  Patient Details  Name: Kimberly [name] MRN: [id number] Date of Birth: [birth year] Referring Provider (PT): Trey Sailors, [place]   Encounter Date: 07/31/2021   PT End of Session - 07/31/21 1157     Visit Number 15    Number of Visits 17    Date for PT Re-Evaluation 08/09/21    Authorization Type UHC [facility] / MCD    Authorization Time Period 06/07/21-08/10/21    Progress Note Due on Visit 20    PT Start Time 1110    PT Stop Time 1150    PT Time Calculation (min) 40 min    Equipment Utilized During Treatment Other (comment);Gait belt    Activity Tolerance Patient tolerated treatment well    Behavior During Therapy [facility] for tasks assessed/performed             Past Medical History:  Diagnosis Date   Diabetes mellitus    Hypertension    Seizures ([place])    last seizure march 2016   Stroke [facility])     Past Surgical History:  Procedure Laterality Date   TRACHEOSTOMY CLOSURE      There were no vitals filed for this visit.   Subjective Assessment - 07/31/21 1118     Subjective No changes to report   Patient is accompained by: Family member    Pertinent History CVA 2011    Limitations Standing;Walking;House hold activities    Patient Stated Goals Patient reports she wants to improve walking and stair negotiation    Currently in Pain? No/denies    Pain Onset More than a month ago                               [facility] Adult PT Treatment/Exercise - 07/31/21 0001       Transfers   Transfers Sit to Stand;Stand to Sit;Squat Pivot Transfers    Stand Pivot Transfers 4: Min guard   transferred with SBCQ to both sides demonstrating good sequencing with increased time.     Knee/Hip Exercises: Aerobic   Stepper seated stepper with RLE, RUE. and LLE (with aid to bring LEF hip/LE  into more neutral position., 8 min, level 1, attempting to keep speed at 60 steps/min.  Performed AA adduction squeezes with knees in flexed position for L hip ABductor stretching                 Balance Exercises - 07/31/21 0001       Balance Exercises: Standing   Other Standing Exercises standing with intermittent UE support [facility]) with multi level reaching, CGA, cues for upper trunk rotation and extension.  Standing with PTA in front and pt's R arm on PTA's working on lateral weight shifting. Pt tolerated 2-4 min in standing.               PT Education - 07/31/21 1157     Education Details discussed scheduling and POC.    Person(s) Educated Patient    Methods Explanation    Comprehension Verbalized understanding              PT Short Term Goals - 07/27/21 1420       PT SHORT TERM GOAL #1   Title Patient will be I with initial HEP to progress with PT  Baseline [id number]; 07/27/21 Verbally reviewed and compliant    Time 4    Period Weeks    Status Achieved    Target Date 07/12/21      PT SHORT TERM GOAL #2   Title Patient will report </= 4/10 right hip pain with standing/walking to reduce functional limitation and improve mobility    Baseline 4/10 pain; 07/05/21 reporting no pain over last 2 sessions.    Time 4    Period Weeks    Status Achieved    Target Date 07/13/19      PT SHORT TERM GOAL #3   Title Patient [facility] and [facility] across level ground    Baseline 36ftx2 with QC and CGA; 07/11/21 Able to ambulate 139ft with 1 rest break. 07/17/21  160ft w/o need of rest with [facility]    Time 4    Period Weeks    Status Achieved    Target Date 07/12/21      PT SHORT TERM GOAL #4   Title Assess BERGand set goal; 07/05/21 BERG goal is 38    Baseline TBD; BERG score 31    Time 4    Period Weeks    Status Achieved    Target Date 07/12/21      PT SHORT TERM GOAL #5   Title Assess gait speed and set appropriate goal.  07/24/21 goal is 0.6  m/s    Baseline TBD; 07/13/21 Gait speed 0.4 ft/s or 0.12.m/s    Time 4    Status Achieved    Target Date 07/12/21               PT Long Term Goals - 06/07/21 1558       PT LONG TERM GOAL #1   Title Patient will be I with final HEP to maintain progress from PT    Baseline [id number]    Time 8    Period Weeks    Status New    Target Date 08/09/21      PT LONG TERM GOAL #2   Title Patient will be able to ambulate 48ft using quad cane in order to improve ability to walk to bathroom in apartment, negotiating doorways and perfoming transfer to/from seated position    Baseline 10ft ambulation across open gym floor with CGA    Time 8    Period Weeks    Status New    Target Date 08/09/21      PT LONG TERM GOAL #3   Title Patient will be able to negotiate >/= 6 steps using single rail without assist to improve community access    Baseline TBD    Time 8    Period Weeks    Status New    Target Date 08/09/21      PT LONG TERM GOAL #4   Title Assess progress towards gait speed    Baseline TBD    Time 8    Period Weeks    Status New    Target Date 08/09/21      PT LONG TERM GOAL #5   Title Assess progress towards BERG    Baseline TBD    Time 8    Period Weeks    Status New    Target Date 08/09/21                   Plan - 07/31/21 1202     Clinical Impression Statement Pt responded well with LE strengthening, ROM and  L hip stretching on seated Sci fit stepper.  Discussed plan for last scheduled treatment; pt will plan to think about whether or not she wants to continue or discharge next visit.  Pt continues to have limited standing endurance and standing imbalance with reaching outside her BOS.    Personal Factors and Comorbidities Fitness;Past/Current Experience;Time since onset of injury/illness/exacerbation;Transportation;Comorbidity 3+    Comorbidities CVA, DM, BMI, epilepsy    Examination-Activity Limitations Locomotion Level;Transfers;Reach  Overhead;Squat;Stairs;Stand;Carry;Bed Mobility;Bathing;Dressing    Examination-Participation Restrictions Meal Prep;Cleaning;Occupation;Community Activity;Driving;Shop;Laundry;Yard Work    Merchant navy officer Evolving/Moderate complexity    Rehab Potential Fair    PT Frequency 2x / week    PT Duration 8 weeks    PT Treatment/Interventions ADLs/Self Care Home Management;Aquatic Therapy;Cryotherapy;Electrical Stimulation;Iontophoresis 4mg /ml Dexamethasone;Moist Heat;Traction;Ultrasound;DME Instruction;Neuromuscular re-education;Balance training;Therapeutic exercise;Therapeutic activities;Functional mobility training;Stair training;Gait training;Patient/family education;Orthotic Fit/Training;Manual techniques;Passive range of motion;Dry needling;Joint Manipulations;Spinal Manipulations;Vasopneumatic Device;Taping    PT Next Visit Plan Discussed POC and  consider pausing PT until AFO obtained?  Follow up with AFO vs. KAFO need?, order, orthotist consult.    PT Home Exercise Plan [id number]    Consulted and Agree with Plan of Care Patient             Patient will benefit from skilled therapeutic intervention in order to improve the following deficits and impairments:  Abnormal gait, Decreased range of motion, Difficulty walking, Impaired UE functional use, Pain, Decreased activity tolerance, Decreased balance, Impaired flexibility, Improper body mechanics, Postural dysfunction, Impaired sensation, Decreased strength, Decreased mobility  Visit Diagnosis: Unsteadiness on feet  Muscle weakness (generalized)     Problem List Patient Active Problem List   Diagnosis Date Noted   Contracture, left foot 09/20/2020   Neuropathy 09/20/2020   Osteoporosis 09/20/2020   Diabetic ketoacidosis without coma associated with type 2 diabetes mellitus ([place]) 08/11/2020   Seizure ([place]) 08/11/2020   Cerebrovascular disease 08/11/2020   Essential hypertension 08/11/2020   Gastro-esophageal  reflux disease without esophagitis 08/11/2020   History of completed stroke 08/11/2020   Hyperglycemia due to type 2 diabetes mellitus ([place]) 08/11/2020   Mixed hyperlipidemia 08/11/2020   Hyperthyroidism 03/11/2019   Diabetes ([place]) 08/12/2018   Palpitations 04/16/2017   Shortness of breath 04/16/2017   Abnormal INR 01/04/2016   Encounter for therapeutic drug level monitoring 08/31/2015   Long term current use of anticoagulant therapy 08/31/2015   Spastic hemiplegia affecting nondominant side ([place]) 10/15/2012   Myalgia and myositis, unspecified 10/15/2012   Cerebral thrombosis with cerebral infarction ([place]) 07/01/2012   Localization-related (focal) (partial) epilepsy and epileptic syndromes with complex partial seizures, without mention of intractable epilepsy 07/01/2012   Extremity pain 07/01/2012   Partial epilepsy with impairment of consciousness, intractable ([place]) 07/01/2012   CVA (cerebral infarction) 02/11/2012   Contracture of left Achilles tendon 02/11/2012    Bjorn Loser, PTA  07/31/21, 12:13 PM   [facility] [address] [place], [place], [address] Phone: [phone]   Fax:  [phone]  Name: Kimberly [name] MRN: [id number] Date of Birth: [birth year]

## 2021-08-02 ENCOUNTER — Ambulatory Visit: Payer: Medicare Other

## 2021-08-09 ENCOUNTER — Other Ambulatory Visit: Payer: Self-pay

## 2021-08-09 ENCOUNTER — Ambulatory Visit: Payer: Medicare Other

## 2021-08-09 DIAGNOSIS — R2681 Unsteadiness on feet: Secondary | ICD-10-CM | POA: Diagnosis not present

## 2021-08-09 DIAGNOSIS — R293 Abnormal posture: Secondary | ICD-10-CM

## 2021-08-09 DIAGNOSIS — R2689 Other abnormalities of gait and mobility: Secondary | ICD-10-CM | POA: Diagnosis not present

## 2021-08-09 DIAGNOSIS — M6281 Muscle weakness (generalized): Secondary | ICD-10-CM

## 2021-08-09 DIAGNOSIS — M25551 Pain in right hip: Secondary | ICD-10-CM | POA: Diagnosis not present

## 2021-08-10 NOTE — Therapy (Signed)
[facility] [address] [place], [place], [address] Phone: [phone]   Fax:  [phone]  Physical Therapy Treatment/Recertification  Patient Details  Name: Kimberly [name] MRN: [id number] Date of Birth: [birth year] Referring Provider (PT): Trey Sailors, [place]   Encounter Date: 08/09/2021   PT End of Session - 08/09/21 1540     Visit Number 16    Number of Visits 24    Date for PT Re-Evaluation 10/06/21    Authorization Type UHC [place] / MCD    Authorization Time Period --    Progress Note Due on Visit 20    PT Start Time 0388    PT Stop Time 8280    PT Time Calculation (min) 39 min    Equipment Utilized During Treatment Other (comment);Gait belt   left givmore sling, left solid ankle custom AFO   Activity Tolerance Patient tolerated treatment well    Behavior During Therapy [facility] for tasks assessed/performed             Past Medical History:  Diagnosis Date   Diabetes mellitus    Hypertension    Seizures ([place])    last seizure march 2016   Stroke [facility])     Past Surgical History:  Procedure Laterality Date   TRACHEOSTOMY CLOSURE      There were no vitals filed for this visit.   Subjective Assessment - 08/09/21 1541     Subjective Pt reports that walking and climbing stairs are the most important things to her. Asking about new brace for leg.    Patient is accompained by: Family member    Pertinent History CVA 2011    Limitations Standing;Walking;House hold activities    Patient Stated Goals Patient reports she wants to improve walking and stair negotiation    Currently in Pain? No/denies    Pain Onset More than a month ago                [facility] PT Assessment - 08/09/21 1546       Assessment   Medical Diagnosis R hip and gait strengthening    Referring Provider (PT) Trey Sailors, [place]    Onset Date/Surgical Date 04/25/21   referral date                           [facility] Adult PT Treatment/Exercise - 08/09/21 1546       Transfers   Transfers Sit to Stand    Sit to Stand 4: Min guard    Sit to Stand Details Verbal cues for technique    Sit to Stand Details (indicate cue type and reason) Pt was cued to bring LLE back more prior to standing. Weight heavily shifted to right when rising.    Stand to Sit 5: Supervision      Ambulation/Gait   Ambulation/Gait Yes    Ambulation/Gait Assistance 4: Min guard    Ambulation/Gait Assistance Details Pt was cued to keep head up for more erect posture. Pt ER at left leg with decreased weight shift. W/c follow for safety. Pt did report some pain in right hip towards end.    Ambulation Distance (Feet) 45 Feet   52' x 1.   Assistive device Small based quad cane   left solid ankle custom AFO   Gait Pattern Step-to pattern;Decreased stance time - left;Decreased hip/knee flexion - left;Decreased weight shift to left;Trunk flexed    Ambulation Surface Level;Indoor  Gait velocity 43.39 sec over 20'= 0.63ms or 0.447fsec completed over 20 ft.    Stairs Yes    Stairs Assistance 2: Max assist   2nd person for safety with descent   Stairs Assistance Details (indicate cue type and reason) PT assisted LLE to clear onto step with ascent. Max assist to place LLE with descent. SPT behind pt as she became very nervous with descent and almost trying to sit.    Stair Management Technique One rail Right;Step to pattern    Number of Stairs 4    Height of Stairs 6                     PT Education - 08/10/21 1616     Education Details Discussed reaching out to primary PT to see what thoughts were about new brace for leg. Explained to pt this PTs concerns with KAFO option as are very cumbersome and leg would be locked out all the time. Would not help with leg externally rotating from hip. Also did not note left knee buckling during this session. Discussed plan to recert dropping down to 1x/week to work some more on weight  shifting to left and steps as well as gait. If find she does need new AFO will most likely hold/discharge at that time until she obtains one.    Person(s) Educated Patient;Spouse    Methods Explanation    Comprehension Verbalized understanding              PT Short Term Goals - 07/27/21 1420       PT SHORT TERM GOAL #1   Title Patient will be I with initial HEP to progress with PT    Baseline [id number]; 07/27/21 Verbally reviewed and compliant    Time 4    Period Weeks    Status Achieved    Target Date 07/12/21      PT SHORT TERM GOAL #2   Title Patient will report </= 4/10 right hip pain with standing/walking to reduce functional limitation and improve mobility    Baseline 4/10 pain; 07/05/21 reporting no pain over last 2 sessions.    Time 4    Period Weeks    Status Achieved    Target Date 07/13/19      PT SHORT TERM GOAL #3   Title Patient [facility] across level ground    Baseline 63f94fwith QC and CGA; 07/11/21 Able to ambulate 115ft75fh 1 rest break. 07/17/21  115ft 47fneed of rest with SBQC  [facility]    Period Weeks    Status Achieved    Target Date 07/12/21      PT SHORT TERM GOAL #4   Title Assess BERGand set goal; 07/05/21 BERG goal is 38    Baseline TBD; BERG score 31    Time 4    Period Weeks    Status Achieved    Target Date 07/12/21      PT SHORT TERM GOAL #5   Title Assess gait speed and set appropriate goal.  07/24/21 goal is 0.6 m/s    Baseline TBD; 07/13/21 Gait speed 0.4 ft/s or 0.12.m/s    Time 4    Status Achieved    Target Date 07/12/21               PT Long Term Goals - 08/09/21 1559       PT LONG TERM GOAL #1   Title  Patient will be I with final HEP to maintain progress from PT    Baseline 08/09/21 Pt reports performing current HEP. PT will continue to add.    Time 8    Period Weeks    Status On-going    Target Date 08/09/21      PT LONG TERM GOAL #2   Title Patient will be able to ambulate 75f  using quad cane in order to improve ability to walk to bathroom in apartment, negotiating doorways and perfoming transfer to/from seated position    Baseline 514fambulation across open gym floor with CGA    Time 8    Period Weeks    Status Partially Met    Target Date 08/09/21      PT LONG TERM GOAL #3   Title Patient will be able to negotiate >/= 6 steps using single rail without assist to improve community access    Baseline 08/09/21 4 steps with max assist with right rail    Time 8    Period Weeks    Status Not Met    Target Date 08/09/21      PT LONG TERM GOAL #4   Title Assess progress towards gait speed    Baseline 08/09/21 0.0111mof 0.30f66fc- decline from prior assessment    Time 8    Period Weeks    Status Not Met    Target Date 08/09/21      PT LONG TERM GOAL #5   Title Assess progress towards BERG    Baseline --    Time 8    Period Weeks    Status Deferred    Target Date 08/09/21            Updated PT goals:  PT Short Term Goals - 08/10/21 1636       PT SHORT TERM GOAL #1   Title Pt will be independent with progressive HEP for strengthening and functional mobility to continue gains on own.    Time 4    Period Weeks    Status New    Target Date 09/07/21      PT SHORT TERM GOAL #2   Title Decision will be made on if truly has more new AFO need.    Time 4    Period Weeks    Status New    Target Date 09/07/21      PT SHORT TERM GOAL #3   Title Pt will be able to perform sit to stand with at least 30% weight shift on left.    Baseline currently <10% as left leg out in front and leaning on to right    Time 4    Period Weeks    Status New    Target Date 09/07/21      PT SHORT TERM GOAL #4   Title Pt will increase gait speed from 0.75m/6m >0.58m/s 65m improved household mobility.    Baseline 08/09/21 0.75m/s 60mime 4    Period Weeks    Status New    Target Date 09/07/21             PT Long Term Goals - 08/10/21 1639       PT LONG  TERM GOAL #1   Title Pt will ambulate >150' with SBQC [facility] with AFO for improved household mobility.    Baseline 08/09/21 55' wit54SBQC before needing to rest    Time 8    Period Weeks    Status  New    Target Date 10/06/21      PT LONG TERM GOAL #2   Title Pt will increase Berg from 31 to >35/56 for improved balance.    Baseline baseline in October was 31/56    Time 8    Period Weeks    Status New    Target Date 10/06/21      PT LONG TERM GOAL #3   Title Patient will be able to negotiate 4 steps using single rail min assist for improved community access    Baseline 08/09/21 4 steps with max assist with right rail    Time 8    Period Weeks    Status Revised    Target Date 10/06/21                   Plan - 08/10/21 1627     Clinical Impression Statement PT assessed LTGs at visit today. Pt was unable to increase gait speed with speed of 0.16ms indicating decreased safety with household mobility. She did not have any bouts of left knee buckling during gait. Left leg does externally rotate with gait and patient has decreased foot clearance. Current left plastic solid custom AFO is providing descent stability at left ankle. Will follow-up with primary PT to see what thoughts were on new leg brace that they had discussed. Pt was able to partially meet gait goal with CGA. She performed steps for first time but needed max assist with descent. Had decreased left weight shift. Unable to assess BMerrilee Janskydue to time constraints. Will plan to recert pt dropping down to 1x/week to determine if any further brace needs as well as working on improving left weight shift, gait and step negotiation.    Personal Factors and Comorbidities Fitness;Past/Current Experience;Time since onset of injury/illness/exacerbation;Transportation;Comorbidity 3+    Comorbidities CVA, DM, BMI, epilepsy    Examination-Activity Limitations Locomotion Level;Transfers;Reach Overhead;Squat;Stairs;Stand;Carry;Bed  Mobility;Bathing;Dressing    Examination-Participation Restrictions Meal Prep;Cleaning;Occupation;Community Activity;Driving;Shop;Laundry;Yard Work    SMerchant navy officerEvolving/Moderate complexity    Rehab Potential Fair    PT Frequency 1x / week    PT Duration 8 weeks    PT Treatment/Interventions ADLs/Self Care Home Management;Aquatic Therapy;Cryotherapy;Electrical Stimulation;Iontophoresis 410mml Dexamethasone;Moist Heat;Traction;Ultrasound;DME Instruction;Neuromuscular re-education;Balance training;Therapeutic exercise;Therapeutic activities;Functional mobility training;Stair training;Gait training;Patient/family education;Orthotic Fit/Training;Manual techniques;Passive range of motion;Dry needling;Joint Manipulations;Spinal Manipulations;Vasopneumatic Device;Taping    PT Next Visit Plan Further assess for any new left leg brace needs. I tend to think current AFO is best option from my one visit. Did reach out to primary PT and he said was thinking about rigid ankle AFO to provide more knee support? Current one did see rigid to me. Weight shifting activities over left leg, step negotation and gait.    PT Home Exercise Plan [id number]    Consulted and Agree with Plan of Care Patient             Patient will benefit from skilled therapeutic intervention in order to improve the following deficits and impairments:  Abnormal gait, Decreased range of motion, Difficulty walking, Impaired UE functional use, Pain, Decreased activity tolerance, Decreased balance, Impaired flexibility, Improper body mechanics, Postural dysfunction, Impaired sensation, Decreased strength, Decreased mobility  Visit Diagnosis: Other abnormalities of gait and mobility  Muscle weakness (generalized)  Unsteadiness on feet  Abnormal posture     Problem List Patient Active Problem List   Diagnosis Date Noted   Contracture, left foot 09/20/2020   Neuropathy 09/20/2020   Osteoporosis 09/20/2020    Diabetic ketoacidosis without  coma associated with type 2 diabetes mellitus ([place]) 08/11/2020   Seizure ([place]) 08/11/2020   Cerebrovascular disease 08/11/2020   Essential hypertension 08/11/2020   Gastro-esophageal reflux disease without esophagitis 08/11/2020   History of completed stroke 08/11/2020   Hyperglycemia due to type 2 diabetes mellitus ([place]) 08/11/2020   Mixed hyperlipidemia 08/11/2020   Hyperthyroidism 03/11/2019   Diabetes ([place]) 08/12/2018   Palpitations 04/16/2017   Shortness of breath 04/16/2017   Abnormal INR 01/04/2016   Encounter for therapeutic drug level monitoring 08/31/2015   Long term current use of anticoagulant therapy 08/31/2015   Spastic hemiplegia affecting nondominant side ([place]) 10/15/2012   Myalgia and myositis, unspecified 10/15/2012   Cerebral thrombosis with cerebral infarction ([place]) 07/01/2012   Localization-related (focal) (partial) epilepsy and epileptic syndromes with complex partial seizures, without mention of intractable epilepsy 07/01/2012   Extremity pain 07/01/2012   Partial epilepsy with impairment of consciousness, intractable ([place]) 07/01/2012   CVA (cerebral infarction) 02/11/2012   Contracture of left Achilles tendon 02/11/2012    Electa Sniff, PT, DPT, NCS 08/10/2021, 4:35 PM  [facility] [address] [place], [place], [address] Phone: [phone]   Fax:  [phone]  Name: Ceili Boshers MRN: [id number] Date of Birth: [birth year]

## 2021-08-29 DIAGNOSIS — R791 Abnormal coagulation profile: Secondary | ICD-10-CM | POA: Diagnosis not present

## 2021-08-29 DIAGNOSIS — Z5181 Encounter for therapeutic drug level monitoring: Secondary | ICD-10-CM | POA: Diagnosis not present

## 2021-08-29 DIAGNOSIS — Z7901 Long term (current) use of anticoagulants: Secondary | ICD-10-CM | POA: Diagnosis not present

## 2021-08-29 DIAGNOSIS — I6330 Cerebral infarction due to thrombosis of unspecified cerebral artery: Secondary | ICD-10-CM | POA: Diagnosis not present

## 2021-08-30 ENCOUNTER — Ambulatory Visit: Payer: Medicare Other | Attending: Physician Assistant

## 2021-08-30 ENCOUNTER — Other Ambulatory Visit: Payer: Self-pay

## 2021-08-30 DIAGNOSIS — R2681 Unsteadiness on feet: Secondary | ICD-10-CM | POA: Insufficient documentation

## 2021-08-30 DIAGNOSIS — R2689 Other abnormalities of gait and mobility: Secondary | ICD-10-CM | POA: Diagnosis not present

## 2021-08-30 DIAGNOSIS — M6281 Muscle weakness (generalized): Secondary | ICD-10-CM | POA: Diagnosis not present

## 2021-08-30 DIAGNOSIS — R293 Abnormal posture: Secondary | ICD-10-CM | POA: Diagnosis not present

## 2021-08-30 NOTE — Therapy (Signed)
[facility] [address] [place], [place], [address] Phone: [phone]   Fax:  [phone]  Physical Therapy Treatment  Patient Details  Name: Kimberly [name] MRN: [id number] Date of Birth: [birth year] Referring Provider (PT): Trey Sailors, [place]   Encounter Date: 08/30/2021   PT End of Session - 08/30/21 1449     Visit Number 17    Number of Visits 24    Date for PT Re-Evaluation 10/06/21    Authorization Type [employer] MRC / MCD    Progress Note Due on Visit 20    PT Start Time 1761    PT Stop Time 1533    PT Time Calculation (min) 46 min    Equipment Utilized During Treatment Other (comment);Gait belt   left solid ankle custom AFO   Activity Tolerance Patient tolerated treatment well    Behavior During Therapy [facility] for tasks assessed/performed             Past Medical History:  Diagnosis Date   Diabetes mellitus    Hypertension    Seizures ([place])    last seizure march 2016   Stroke [facility])     Past Surgical History:  Procedure Laterality Date   TRACHEOSTOMY CLOSURE      There were no vitals filed for this visit.   Subjective Assessment - 08/30/21 1449     Subjective Pt denies any new issues. She had a good Christmas and New Year.    Patient is accompained by: Family member    Pertinent History CVA 2011    Limitations Standing;Walking;House hold activities    Patient Stated Goals Patient reports she wants to improve walking and stair negotiation    Currently in Pain? No/denies    Pain Onset More than a month ago                               [facility] Adult PT Treatment/Exercise - 08/30/21 1450       Transfers   Transfers Sit to Stand;Stand to Sit    Sit to Stand 5: Supervision;4: Min guard;With upper extremity assist    Sit to Stand Details (indicate cue type and reason) Pt relies heavily on RUE and RLE to rise. Did try to position LLE under her prior to rising.    Stand to Sit 5:  Supervision      Ambulation/Gait   Ambulation/Gait Yes    Ambulation/Gait Assistance 4: Min guard    Ambulation/Gait Assistance Details After stretching to right hip and hip adduction work pt was able to demonstrate some improvement in ER at left hip during gait. Right hip does start to bother her towards end. W/c follow.    Ambulation Distance (Feet) 45 Feet    Assistive device Small based quad cane   left solid custom AFO   Gait Pattern Step-to pattern;Decreased arm swing - left;Decreased stance time - left;Decreased hip/knee flexion - left    Ambulation Surface Level;Indoor      Exercises   Exercises Other Exercises    Other Exercises  In hooklying: PT performed left hip ER stretch in to IR 1 min x 4. Caregiver present and observing for performance at home. Bridges x 10 with PT stabilizing at LLE to keep hip in neutral and verbal cues to breath and try to increase left clearance to keep pelvis more level. Isometric hip adduction squeezing purple ball between legs x 10 with cues to  squeeze on left and just stabilize on right. Seated hip adduction squeezing ball x 10. Self left hip external rotatoror stretch with reaching across with RUE to pull across 1 min x 3.                     PT Education - 08/30/21 2007     Education Details Added left hip stretch into IR with caregiver assist in hooklying or self stretch seated and hip adductor strengthening. Discussed that this PT does not feel that new AFO would be helpful as she has solid custom AFO currently and needs that support. KAFO would be too cumbersome for her and not help with ER at hip.    Person(s) Educated Patient    Methods Explanation;Demonstration;Handout    Comprehension Verbalized understanding;Returned demonstration              PT Short Term Goals - 08/10/21 1636       PT SHORT TERM GOAL #1   Title Pt will be independent with progressive HEP for strengthening and functional mobility to continue gains on  own.    Time 4    Period Weeks    Status New    Target Date 09/07/21      PT SHORT TERM GOAL #2   Title Decision will be made on if truly has more new AFO need.    Time 4    Period Weeks    Status New    Target Date 09/07/21      PT SHORT TERM GOAL #3   Title Pt will be able to perform sit to stand with at least 30% weight shift on left.    Baseline currently <10% as left leg out in front and leaning on to right    Time 4    Period Weeks    Status New    Target Date 09/07/21      PT SHORT TERM GOAL #4   Title Pt will increase gait speed from 0.66m/s to >0.96m/s for improved household mobility.    Baseline 08/09/21 0.39m/s    Time 4    Period Weeks    Status New    Target Date 09/07/21               PT Long Term Goals - 08/10/21 1639       PT LONG TERM GOAL #1   Title Pt will ambulate >150' with [facility] supervision with AFO for improved household mobility.    Baseline 08/09/21 24' with SBQC before needing to rest    Time 8    Period Weeks    Status New    Target Date 10/06/21      PT LONG TERM GOAL #2   Title Pt will increase Berg from 31 to >35/56 for improved balance.    Baseline baseline in October was 31/56    Time 8    Period Weeks    Status New    Target Date 10/06/21      PT LONG TERM GOAL #3   Title Patient will be able to negotiate 4 steps using single rail min assist for improved community access    Baseline 08/09/21 4 steps with max assist with right rail    Time 8    Period Weeks    Status Revised    Target Date 10/06/21                   Plan - 08/30/21 2009  Clinical Impression Statement PT focused on trying to stretch out left hip and get more hip adductor activation to decrease amount if ER present during gait. Did note some improvement at end of session. This PT does not feel that new AFO would be beneficial at this time.    Personal Factors and Comorbidities Fitness;Past/Current Experience;Time since onset of  injury/illness/exacerbation;Transportation;Comorbidity 3+    Comorbidities CVA, DM, BMI, epilepsy    Examination-Activity Limitations Locomotion Level;Transfers;Reach Overhead;Squat;Stairs;Stand;Carry;Bed Mobility;Bathing;Dressing    Examination-Participation Restrictions Meal Prep;Cleaning;Occupation;Community Activity;Driving;Shop;Laundry;Yard Work    [employer] Evolving/Moderate complexity    Rehab Potential Fair    PT Frequency 1x / week    PT Duration 8 weeks    PT Treatment/Interventions ADLs/Self Care Home Management;Aquatic Therapy;Cryotherapy;Electrical Stimulation;Iontophoresis 4mg /ml Dexamethasone;Moist Heat;Traction;Ultrasound;DME Instruction;Neuromuscular re-education;Balance training;Therapeutic exercise;Therapeutic activities;Functional mobility training;Stair training;Gait training;Patient/family education;Orthotic Fit/Training;Manual techniques;Passive range of motion;Dry needling;Joint Manipulations;Spinal Manipulations;Vasopneumatic Device;Taping    PT Next Visit Plan How are new hip stretches and exercises going? Check STGs. Weight shifting activities over left leg, step negotation and gait.    PT Home Exercise Plan [email or web address]    Consulted and Agree with Plan of Care Patient             Patient will benefit from skilled therapeutic intervention in order to improve the following deficits and impairments:  Abnormal gait, Decreased range of motion, Difficulty walking, Impaired UE functional use, Pain, Decreased activity tolerance, Decreased balance, Impaired flexibility, Improper body mechanics, Postural dysfunction, Impaired sensation, Decreased strength, Decreased mobility  Visit Diagnosis: Other abnormalities of gait and mobility  Muscle weakness (generalized)     Problem List Patient Active Problem List   Diagnosis Date Noted   Contracture, left foot 09/20/2020   Neuropathy 09/20/2020   Osteoporosis 09/20/2020   Diabetic ketoacidosis  without coma associated with type 2 diabetes mellitus ([place]) 08/11/2020   Seizure ([place]) 08/11/2020   Cerebrovascular disease 08/11/2020   Essential hypertension 08/11/2020   Gastro-esophageal reflux disease without esophagitis 08/11/2020   History of completed stroke 08/11/2020   Hyperglycemia due to type 2 diabetes mellitus ([place]) 08/11/2020   Mixed hyperlipidemia 08/11/2020   Hyperthyroidism 03/11/2019   Diabetes ([place]) 08/12/2018   Palpitations 04/16/2017   Shortness of breath 04/16/2017   Abnormal INR 01/04/2016   Encounter for therapeutic drug level monitoring 08/31/2015   Long term current use of anticoagulant therapy 08/31/2015   Spastic hemiplegia affecting nondominant side ([place]) 10/15/2012   Myalgia and myositis, unspecified 10/15/2012   Cerebral thrombosis with cerebral infarction ([place]) 07/01/2012   Localization-related (focal) (partial) epilepsy and epileptic syndromes with complex partial seizures, without mention of intractable epilepsy 07/01/2012   Extremity pain 07/01/2012   Partial epilepsy with impairment of consciousness, intractable ([place]) 07/01/2012   CVA (cerebral infarction) 02/11/2012   Contracture of left Achilles tendon 02/11/2012    Electa Sniff, PT, DPT, NCS 08/30/2021, 8:12 PM  [facility] [address] [place], [place], [address] Phone: [phone]   Fax:  [phone]  Name: Nicole Defino MRN: [id number] Date of Birth: 05/11/1961

## 2021-08-30 NOTE — Patient Instructions (Addendum)
Access Code: [email or web address] URL: [email or web address] Date: 08/30/2021 Prepared by: Cherly Anderson  Exercises Sit to Stand with Armchair - 2 x daily - 7 x weekly - 1 sets - 5 reps Backward Walking with Counter Support - 2 x daily - 7 x weekly - 1 sets - 5 reps Supine Bridge - 1 x daily - 5 x weekly - 1 sets - 10 reps Supine Bilateral Hip Internal Rotation Stretch - 3 x daily - 7 x weekly - 1 sets - 4 reps - 1 min hold Seated Hip Adduction Squeeze with Ball - 2 x daily - 7 x weekly - 2 sets - 10 reps

## 2021-09-03 ENCOUNTER — Other Ambulatory Visit: Payer: Self-pay | Admitting: Endocrinology

## 2021-09-06 ENCOUNTER — Ambulatory Visit: Payer: Medicare Other | Admitting: Physical Therapy

## 2021-09-06 ENCOUNTER — Ambulatory Visit (INDEPENDENT_AMBULATORY_CARE_PROVIDER_SITE_OTHER): Payer: Commercial Managed Care - HMO | Admitting: Endocrinology

## 2021-09-06 ENCOUNTER — Encounter: Payer: Self-pay | Admitting: Endocrinology

## 2021-09-06 ENCOUNTER — Other Ambulatory Visit: Payer: Self-pay

## 2021-09-06 ENCOUNTER — Encounter: Payer: Self-pay | Admitting: Physical Therapy

## 2021-09-06 VITALS — BP 118/84 | Ht 63.0 in | Wt 249.0 lb

## 2021-09-06 DIAGNOSIS — M6281 Muscle weakness (generalized): Secondary | ICD-10-CM | POA: Diagnosis not present

## 2021-09-06 DIAGNOSIS — R2689 Other abnormalities of gait and mobility: Secondary | ICD-10-CM | POA: Diagnosis not present

## 2021-09-06 DIAGNOSIS — E119 Type 2 diabetes mellitus without complications: Secondary | ICD-10-CM

## 2021-09-06 DIAGNOSIS — R2681 Unsteadiness on feet: Secondary | ICD-10-CM | POA: Diagnosis not present

## 2021-09-06 DIAGNOSIS — R293 Abnormal posture: Secondary | ICD-10-CM

## 2021-09-06 DIAGNOSIS — E0590 Thyrotoxicosis, unspecified without thyrotoxic crisis or storm: Secondary | ICD-10-CM

## 2021-09-06 LAB — POCT GLYCOSYLATED HEMOGLOBIN (HGB A1C): Hemoglobin A1C: 10.7 % — AB (ref 4.0–5.6)

## 2021-09-06 LAB — T4, FREE: Free T4: 1.03 ng/dL (ref 0.60–1.60)

## 2021-09-06 LAB — TSH: TSH: 1.34 u[IU]/mL (ref 0.35–5.50)

## 2021-09-06 MED ORDER — BD PEN NEEDLE MINI U/F 31G X 5 MM MISC: 1.0000 | Freq: Every day | 3 refills | Status: DC

## 2021-09-06 NOTE — Patient Instructions (Addendum)
check your blood sugar twice a day.  vary the time of day when you check, between before the 3 meals, and at bedtime.  also check if you have symptoms of your blood sugar being too high or too low.  please keep a record of the readings and bring it to your next appointment here (or you can bring the meter itself).  You can write it on any piece of paper.  please call us sooner if your blood sugar goes below 70, or if you have a lot of readings over 200.   Please continue the same [place] for now.  Please take these each day, as it helps your health.   Please come back for a follow-up appointment in 3 months.

## 2021-09-06 NOTE — Progress Notes (Signed)
Subjective:    Patient ID: Kimberly [name], female    DOB: 05-19-1961, 61 y.o.   MRN: [id number]  HPI Pt returns for f/u of DM: DM type: Insulin-requiring type 2.   Dx'ed: 5852 Complications: CVA Therapy: Insulin since 2019, and Farxiga.   GDM: never DKA: never Severe hypoglycemia: never.  Pancreatitis: never Pancreatic imaging: never.   Other: she also took insulin 2011-2017; she declines multiple daily injections; she stopped lantus due to n/v; she tolerates tresiba well; edema limits rx options; she did not tolerate metformin (nausea), Rybelsus (diarrhea), or Trulicity (nausea); fructosamine has confirmed A1c.    Interval history: He brings his meter with his cbg's which I have reviewed today.  cbg's vary from 118-224.  There is no trend throughout the day. She says Tyler Aas is 65/d, but pt says she misses approx 2/week.  Pt says this is because "I don't want to take it."   Pt also has hyperthyroidism (dx'ed 2020; pt says she also had hyperthyroidism in 2010; it resolved; she was rx'ed tapazole in 2020 for recurrence; she has never had dedicated thyroid imaging; she chose tapazole rx; this was stopped 1/21, due to elev TSH).  No new sxs.   Past Medical History:  Diagnosis Date   Diabetes mellitus    Hypertension    Seizures ([place])    last seizure march 2016   Stroke [facility])     Past Surgical History:  Procedure Laterality Date   TRACHEOSTOMY CLOSURE      Social History   Socioeconomic History   Marital status: Married    Spouse name: Not on file   Number of children: Not on file   Years of education: Not on file   Highest education level: Not on file  Occupational History   Not on file  Tobacco Use   Smoking status: Never   Smokeless tobacco: Never  Substance and Sexual Activity   Alcohol use: No   Drug use: No   Sexual activity: Not on file  Other Topics Concern   Not on file  Social History Narrative   Not on file   Social Determinants of Health   Financial  Resource Strain: Not on file  Food Insecurity: Not on file  Transportation Needs: Not on file  Physical Activity: Not on file  Stress: Not on file  Social Connections: Not on file  Intimate Partner Violence: Not on file    Current Outpatient Medications on File Prior to Visit  Medication Sig Dispense Refill   ACCU-CHEK GUIDE test strip CHECK BLOOD SUGARS TWICE A DAY 100 strip 12   Accu-Chek Softclix Lancets lancets Use to monitor glucose levels once daily; E11.9 (Patient taking differently: 1 each by Other route daily. E11.9) 100 each 3   amoxicillin-clavulanate (AUGMENTIN) 875-125 MG tablet Take 1 tablet by mouth 2 (two) times daily.     benzonatate (TESSALON) 100 MG capsule Take 1 capsule (100 mg total) by mouth every 8 (eight) hours. 21 capsule 0   butalbital-acetaminophen-caffeine (FIORICET, ESGIC) 50-325-40 MG per tablet Take 1 tablet by mouth 2 (two) times daily as needed for headache.     cholecalciferol (VITAMIN D) 1000 UNITS tablet Take 1,000 Units by mouth daily.     Cinnamon 500 MG capsule Take by mouth.     Coenzyme Q10 50 MG CAPS Take by mouth.     Coenzyme Q10 50 MG CAPS Take by mouth.     diclofenac (VOLTAREN) 75 MG EC tablet Take 75 mg by mouth 2 (  two) times daily.     enoxaparin (LOVENOX) 150 MG/ML injection Take 150 mg subcutaneously every 24 hours except 1/2 dosage on 01/04/21 per bridge plan     FARXIGA 10 MG TABS tablet TAKE 1 TABLET BY MOUTH EVERY DAY 30 tablet 3   hydrocortisone cream 0.5 % Apply topically as needed.     ibuprofen (ADVIL) 800 MG tablet Take 800 mg by mouth every 8 (eight) hours as needed.     insulin degludec (TRESIBA FLEXTOUCH) 200 UNIT/ML FlexTouch Pen Inject 66 Units into the skin daily. 42 mL 3   lacosamide (VIMPAT) 200 MG TABS tablet SMARTSIG:Tablet(s) By Mouth     Lancets Misc. (ACCU-CHEK SOFTCLIX LANCET DEV) KIT See admin instructions.     latanoprost (XALATAN) 0.005 % ophthalmic solution INSTILL 1 DROP(S) IN EACH EYE DAILY IN THE EVENING      latanoprost (XALATAN) 0.005 % ophthalmic solution Apply to eye.     metoprolol succinate (TOPROL-XL) 50 MG 24 hr tablet 1 tab(s)     neomycin-polymyxin-hydrocortisone (CORTISPORIN) OTIC solution Apply 1 to 2 drops to toe BID after soaking 10 mL 0   NON FORMULARY once a week. Ultimate eye support supplement --taking once a week     ofloxacin (FLOXIN) 0.3 % OTIC solution 5 drops 2 (two) times daily.     Omega-3 1000 MG CAPS Take by mouth.     timolol (TIMOPTIC) 0.5 % ophthalmic solution SMARTSIG:In Eye(s)     traMADol (ULTRAM) 50 MG tablet Take 1 tablet (50 mg total) by mouth every 6 (six) hours as needed. 15 tablet 0   warfarin (COUMADIN) 5 MG tablet Take by mouth.     zonisamide (ZONEGRAN) 100 MG capsule Take 300 mg by mouth.     baclofen (LIORESAL) 20 MG tablet Take 20 mg by mouth 3 (three) times daily.  2   simvastatin (ZOCOR) 40 MG tablet Take 40 mg by mouth at bedtime.     No current facility-administered medications on file prior to visit.    No Known Allergies  Family History  Problem Relation Age of Onset   Cancer Father    Diabetes Neg Hx     BP 118/84 (BP Location: Right Arm, Patient Position: Sitting, Cuff Size: Normal)    Ht '5\' 3"'  (1.6 m)    Wt 249 lb (112.9 kg)    LMP 06/15/2013    BMI 44.11 kg/m    Review of Systems She denies hypoglycemia.      Objective:   Physical Exam VITAL SIGNS:  See vs page.   GENERAL: no distress.  In [place].     Lab Results  Component Value Date   CREATININE 0.68 05/18/2021   BUN 12 05/18/2021   NA 136 05/18/2021   K 3.8 05/18/2021   CL 104 05/18/2021   CO2 22 05/18/2021   A1c=10.7%      Assessment & Plan:  Insulin-requiring type 2 DM: uncontrolled Noncompliance with insulin.  We discussed need to take as rx'ed Hyperthyroidism: recheck today.  Patient Instructions  check your blood sugar twice a day.  vary the time of day when you check, between before the 3 meals, and at bedtime.  also check if you have symptoms of your blood  sugar being too high or too low.  please keep a record of the readings and bring it to your next appointment here (or you can bring the meter itself).  You can write it on any piece of paper.  please call us sooner if  your blood sugar goes below 70, or if you have a lot of readings over 200.   Please continue the same [place] for now.  Please take these each day, as it helps your health.   Please come back for a follow-up appointment in 3 months.

## 2021-09-07 ENCOUNTER — Encounter: Payer: Self-pay | Admitting: Endocrinology

## 2021-09-07 DIAGNOSIS — I69053 Hemiplegia and hemiparesis following nontraumatic subarachnoid hemorrhage affecting right non-dominant side: Secondary | ICD-10-CM | POA: Diagnosis not present

## 2021-09-07 DIAGNOSIS — R569 Unspecified convulsions: Secondary | ICD-10-CM | POA: Diagnosis not present

## 2021-09-07 DIAGNOSIS — E1165 Type 2 diabetes mellitus with hyperglycemia: Secondary | ICD-10-CM | POA: Diagnosis not present

## 2021-09-07 DIAGNOSIS — I10 Essential (primary) hypertension: Secondary | ICD-10-CM | POA: Diagnosis not present

## 2021-09-07 DIAGNOSIS — E782 Mixed hyperlipidemia: Secondary | ICD-10-CM | POA: Diagnosis not present

## 2021-09-07 DIAGNOSIS — I6789 Other cerebrovascular disease: Secondary | ICD-10-CM | POA: Diagnosis not present

## 2021-09-07 NOTE — Therapy (Signed)
[facility] [address] [place], [place], [address] Phone: [phone]   Fax:  [phone]  Physical Therapy Treatment  Patient Details  Name: Kimberly [name] MRN: [id number] Date of Birth: 09-Jul-1961 Referring Provider (PT): Trey Sailors, [place]   Encounter Date: 09/06/2021   PT End of Session - 09/06/21 1452     Visit Number 18    Number of Visits 24    Date for PT Re-Evaluation 10/06/21    Authorization Type [employer] MRC / MCD    Progress Note Due on Visit 20    PT Start Time 1448    PT Stop Time 1530    PT Time Calculation (min) 42 min    Equipment Utilized During Treatment Other (comment);Gait belt   left solid ankle custom AFO   Activity Tolerance Patient tolerated treatment well    Behavior During Therapy [facility] for tasks assessed/performed             Past Medical History:  Diagnosis Date   Diabetes mellitus    Hypertension    Seizures ([place])    last seizure march 2016   Stroke [facility])     Past Surgical History:  Procedure Laterality Date   TRACHEOSTOMY CLOSURE      There were no vitals filed for this visit.   Subjective Assessment - 09/06/21 1451     Subjective No new complaints. Was able to try some of the ex's at home, limited due to no one to assist with some of them as her CNA has stenosis and can not help her much. No falls or pain to report.    Patient is accompained by: Family member    Pertinent History CVA 2011    Limitations Standing;Walking;House hold activities    Patient Stated Goals Patient reports she wants to improve walking and stair negotiation    Currently in Pain? No/denies    Pain Score 0-No pain                     [facility] Adult PT Treatment/Exercise - 09/06/21 1453       Transfers   Transfers Sit to Stand;Stand to Sit    Sit to Stand 5: Supervision;4: Min guard;With upper extremity assist;From bed;From chair/3-in-1    Stand to Sit 5: Supervision;With upper  extremity assist;To bed;To chair/3-in-1      Ambulation/Gait   Ambulation/Gait Yes    Ambulation/Gait Assistance 4: Min guard    Ambulation/Gait Assistance Details cues for posture and weight shifting onto left LE in stance. two episodes of right foot catching with pt able to self correct balance when this occured each time.    Ambulation Distance (Feet) 40 Feet   x1, 30 x1, 15 x1   Assistive device Small based quad cane   left solid custom AFO   Gait Pattern Step-to pattern;Decreased arm swing - left;Decreased stance time - left;Decreased hip/knee flexion - left    Ambulation Surface Level;Indoor    Gait velocity 69.93 sec's (entire distance for 10 meter)= 0.47 ft/sec or 0.14 m/s      Exercises   Exercises Other Exercises    Other Exercises  hooklying/supine on mat table: with left LE bend working on passive IRto stretch hip for 1 minute hold x 3 reps. bridges x 10 reps with assit to stabilize left LE, cues for increased pelvic lift. with left LE in extension working on IR at hip for stretching as pt prefers to rest in ER for  30 sec holds x 3 reps. with left LE extended with foot on pillow case on sliding board for heel slides x 10 reps with assist for keeping LE in neutral and completion of range of motion. then with left LE bend had pt work on hip flexion to bring foot out to side<>then back to starting point for 10 reps with assistance needed; in sitting at edge of mat- had pt perform pillow squeezed with right LE staying stationary and left LE moving only. Pt able to self hold pillow and perform without assistand whereas in supine she was unable to set up left LE in flexion or hold pillow when practiced in session today. Changed ex for home from supine to sitting at edge of mat. then had pt work on seated heel slides with foot on pillowcase on slide board with assist to keep foot/ankle/knee in neutral, pt with limited active range of motion, increased with active assist.                        PT Short Term Goals - 09/06/21 1630       PT SHORT TERM GOAL #1   Title Pt will be independent with progressive HEP for strengthening and functional mobility to continue gains on own.    Baseline 09/06/21: pt has program. Unable to perform all of them due to decreased assistance at home. Revised supine single leg pillow squeeze to in seated position to allow pt to self perform.    Status Partially Met      PT SHORT TERM GOAL #2   Title Decision will be made on if truly has more new AFO need.    Baseline 09/06/21: per PT last note pt does not appear to need a new AFO.    Status Achieved      PT SHORT TERM GOAL #3   Title Pt will be able to perform sit to stand with at least 30% weight shift on left.    Baseline 09/07/21: Pt appears to place weight on left LE with standing    Time --    Period --    Status Partially Met    Target Date --      PT SHORT TERM GOAL #4   Title Pt will increase gait speed from 0.69ms to >0.237m for improved household mobility.    Baseline 09/06/21: 0.14 m/s, pt able to perform entire distance this session    Time --    Period --    Status Partially Met    Target Date --               PT Long Term Goals - 08/10/21 1639       PT LONG TERM GOAL #1   Title Pt will ambulate >150' with SBNorcap Lodgeupervision with AFO for improved household mobility.    Baseline 08/09/21 5585with SBQC before needing to rest    Time 8    Period Weeks    Status New    Target Date 10/06/21      PT LONG TERM GOAL #2   Title Pt will increase Berg from 31 to >35/56 for improved balance.    Baseline baseline in October was 31/56    Time 8    Period Weeks    Status New    Target Date 10/06/21      PT LONG TERM GOAL #3   Title Patient will be able to negotiate 4 steps using single  rail min assist for improved community access    Baseline 08/09/21 4 steps with max assist with right rail    Time 8    Period Weeks    Status Revised    Target Date  10/06/21                   Plan - 09/06/21 1453     Clinical Impression Statement Today's skilled session initially focused on progress toward STGs with goals paritally to fully met. Remainder of session continued to focus on left LE strengthening in supine and sitting. No issues noted or reported in session today. The pt is making progress and should benefit from continued PT to progress toward unmet goals.    Personal Factors and Comorbidities Fitness;Past/Current Experience;Time since onset of injury/illness/exacerbation;Transportation;Comorbidity 3+    Comorbidities CVA, DM, BMI, epilepsy    Examination-Activity Limitations Locomotion Level;Transfers;Reach Overhead;Squat;Stairs;Stand;Carry;Bed Mobility;Bathing;Dressing    Examination-Participation Restrictions Meal Prep;Cleaning;Occupation;Community Activity;Driving;Shop;Laundry;Yard Work    [employer]chant [employer] officer Evolving/Moderate complexity    Rehab Potential Fair    PT Frequency 1x / week    PT Duration 8 weeks    PT Treatment/Interventions ADLs/Self Care Home Management;Aquatic Therapy;Cryotherapy;Electrical Stimulation;Iontophoresis 73m/ml Dexamethasone;Moist Heat;Traction;Ultrasound;DME Instruction;Neuromuscular re-education;Balance training;Therapeutic exercise;Therapeutic activities;Functional mobility training;Stair training;Gait training;Patient/family education;Orthotic Fit/Training;Manual techniques;Passive range of motion;Dry needling;Joint Manipulations;Spinal Manipulations;Vasopneumatic Device;Taping    PT Next Visit Plan Weight shifting activities over left leg, step negotation and gait.    PT Home Exercise Plan [id number]    Consulted and Agree with Plan of Care Patient             Patient will benefit from skilled therapeutic intervention in order to improve the following deficits and impairments:  Abnormal gait, Decreased range of motion, Difficulty walking, Impaired UE functional use, Pain,  Decreased activity tolerance, Decreased balance, Impaired flexibility, Improper body mechanics, Postural dysfunction, Impaired sensation, Decreased strength, Decreased mobility  Visit Diagnosis: Other abnormalities of gait and mobility  Muscle weakness (generalized)  Unsteadiness on feet  Abnormal posture     Problem List Patient Active Problem List   Diagnosis Date Noted   Contracture, left foot 09/20/2020   Neuropathy 09/20/2020   Osteoporosis 09/20/2020   Diabetic ketoacidosis without coma associated with type 2 diabetes mellitus ([place] 08/11/2020   Seizure ([place] 08/11/2020   Cerebrovascular disease 08/11/2020   Essential hypertension 08/11/2020   Gastro-esophageal reflux disease without esophagitis 08/11/2020   History of completed stroke 08/11/2020   Hyperglycemia due to type 2 diabetes mellitus (HK[place] 08/11/2020   Mixed hyperlipidemia 08/11/2020   Hyperthyroidism 03/11/2019   Diabetes ([place] 08/12/2018   Palpitations 04/16/2017   Shortness of breath 04/16/2017   Abnormal INR 01/04/2016   Encounter for therapeutic drug level monitoring 08/31/2015   Long term current use of anticoagulant therapy 08/31/2015   Spastic hemiplegia affecting nondominant side ([place] 10/15/2012   Myalgia and myositis, unspecified 10/15/2012   Cerebral thrombosis with cerebral infarction ([place] 07/01/2012   Localization-related (focal) (partial) epilepsy and epileptic syndromes with complex partial seizures, without mention of intractable epilepsy 07/01/2012   Extremity pain 07/01/2012   Partial epilepsy with impairment of consciousness, intractable (HF[place] 07/01/2012   CVA (cerebral infarction) 02/11/2012   Contracture of left Achilles tendon 02/11/2012    KWillow Ora PTA, CEyecare Medical GroupOutpatient Neuro RPhysicians Surgery Center At Good [facility]ar[facility] LLC[address] [place] [phone]/12/23, 5:55 PM   Name: Kimberly AvalloneMRN: [id number] of Birth: [birth year]

## 2021-09-13 ENCOUNTER — Ambulatory Visit: Payer: Medicare Other

## 2021-09-13 ENCOUNTER — Other Ambulatory Visit: Payer: Self-pay

## 2021-09-13 DIAGNOSIS — R2689 Other abnormalities of gait and mobility: Secondary | ICD-10-CM | POA: Diagnosis not present

## 2021-09-13 DIAGNOSIS — M6281 Muscle weakness (generalized): Secondary | ICD-10-CM

## 2021-09-13 DIAGNOSIS — R2681 Unsteadiness on feet: Secondary | ICD-10-CM

## 2021-09-13 DIAGNOSIS — R293 Abnormal posture: Secondary | ICD-10-CM | POA: Diagnosis not present

## 2021-09-13 NOTE — Therapy (Signed)
[facility] [address] [place], [place], [address] Phone: [phone]   Fax:  [phone]  Physical Therapy Treatment  Patient Details  Name: Kimberly [name] MRN: [id number] Date of Birth: September 02, 1960 Referring Provider (PT): Trey Sailors, [place]   Encounter Date: 09/13/2021   PT End of Session - 09/13/21 1453     Visit Number 19    Number of Visits 24    Date for PT Re-Evaluation 10/06/21    Authorization Type UHC MRC / MCD    Progress Note Due on Visit 20    PT Start Time 1450    PT Stop Time 1528    PT Time Calculation (min) 38 min    Equipment Utilized During Treatment Other (comment);Gait belt   left solid ankle custom AFO   Activity Tolerance Patient tolerated treatment well    Behavior During Therapy [facility] for tasks assessed/performed             Past Medical History:  Diagnosis Date   Diabetes mellitus    Hypertension    Seizures ([place])    last seizure march 2016   Stroke [facility])     Past Surgical History:  Procedure Laterality Date   TRACHEOSTOMY CLOSURE      There were no vitals filed for this visit.   Subjective Assessment - 09/13/21 1453     Subjective Pt reports that she is doing ok with stretches. Her mid back is a little sore today.    Patient is accompained by: Family member    Pertinent History CVA 2011    Limitations Standing;Walking;House hold activities    Patient Stated Goals Patient reports she wants to improve walking and stair negotiation    Currently in Pain? Yes    Pain Score 2     Pain Location Back    Pain Orientation Mid    Pain Descriptors / Indicators Sore;Aching    Pain Type Chronic pain    Pain Onset In the past 7 days    Pain Frequency Intermittent    Pain Relieving Factors tylenol, rest                               [facility] Adult PT Treatment/Exercise - 09/13/21 1455       Transfers   Transfers Sit to Stand;Stand to Sit    Sit to Stand  5: Supervision;With upper extremity assist    Sit to Stand Details (indicate cue type and reason) Weight shifted to RLE when rising    Stand to Sit 5: Supervision      Ambulation/Gait   Ambulation/Gait Yes      Neuro Re-ed    Neuro Re-ed Details  Weight shifting in // bars: steping forward and back with RLE to increase left weight shift with verbal and tactile cues to shift to left prior to stepping, then tapping 2" step with RLE x 5, then tapping 4" step with RLE again to increase left weight shift with PT stabilizing at LLE but no buckling noted. Standing with RLE on 2" step with tactile cues for upright posture then letting go with RUE and performing shoulder flexion 5 x 2 CGA with PT again stabilizing at LLE for safety but no buckling noted. Pt able to get more upright posture with cuing.      Exercises   Exercises Other Exercises    Other Exercises  PT had pt perform mid back  stretches: slouching forward and then coming back up chest out x 5 then thoracic rotation x 5 to each side.                       PT Short Term Goals - 09/06/21 1630       PT SHORT TERM GOAL #1   Title Pt will be independent with progressive HEP for strengthening and functional mobility to continue gains on own.    Baseline 09/06/21: pt has program. Unable to perform all of them due to decreased assistance at home. Revised supine single leg pillow squeeze to in seated position to allow pt to self perform.    Status Partially Met      PT SHORT TERM GOAL #2   Title Decision will be made on if truly has more new AFO need.    Baseline 09/06/21: per PT last note pt does not appear to need a new AFO.    Status Achieved      PT SHORT TERM GOAL #3   Title Pt will be able to perform sit to stand with at least 30% weight shift on left.    Baseline 09/07/21: Pt appears to place weight on left LE with standing    Time --    Period --    Status Partially Met    Target Date --      PT SHORT TERM GOAL #4    Title Pt will increase gait speed from 0.51ms to >0.263m for improved household mobility.    Baseline 09/06/21: 0.14 m/s, pt able to perform entire distance this session    Time --    Period --    Status Partially Met    Target Date --               PT Long Term Goals - 08/10/21 1639       PT LONG TERM GOAL #1   Title Pt will ambulate >150' with SBFranklin Foundation Hospitalupervision with AFO for improved household mobility.    Baseline 08/09/21 5583with SBQC before needing to rest    Time 8    Period Weeks    Status New    Target Date 10/06/21      PT LONG TERM GOAL #2   Title Pt will increase Berg from 31 to >35/56 for improved balance.    Baseline baseline in October was 31/56    Time 8    Period Weeks    Status New    Target Date 10/06/21      PT LONG TERM GOAL #3   Title Patient will be able to negotiate 4 steps using single rail min assist for improved community access    Baseline 08/09/21 4 steps with max assist with right rail    Time 8    Period Weeks    Status Revised    Target Date 10/06/21                   Plan - 09/13/21 2026     Clinical Impression Statement PT focused on increasing LLE weight shift. Pt able to improve with verbal, tactile and visual cues in mirror. No buckling noted at left knee.    Personal Factors and Comorbidities Fitness;Past/Current Experience;Time since onset of injury/illness/exacerbation;Transportation;Comorbidity 3+    Comorbidities CVA, DM, BMI, epilepsy    Examination-Activity Limitations Locomotion Level;Transfers;Reach Overhead;Squat;Stairs;Stand;Carry;Bed Mobility;Bathing;Dressing    Examination-Participation Restrictions Meal Prep;Cleaning;Occupation;Community Activity;Driving;Shop;Laundry;Yard Work    StMerchant navy officer  Evolving/Moderate complexity    Rehab Potential Fair    PT Frequency 1x / week    PT Duration 8 weeks    PT Treatment/Interventions ADLs/Self Care Home Management;Aquatic  Therapy;Cryotherapy;Electrical Stimulation;Iontophoresis 21m/ml Dexamethasone;Moist Heat;Traction;Ultrasound;DME Instruction;Neuromuscular re-education;Balance training;Therapeutic exercise;Therapeutic activities;Functional mobility training;Stair training;Gait training;Patient/family education;Orthotic Fit/Training;Manual techniques;Passive range of motion;Dry needling;Joint Manipulations;Spinal Manipulations;Vasopneumatic Device;Taping    PT Next Visit Plan Weight shifting activities over left leg, step negotation and gait.    PT Home Exercise Plan [id number]    Consulted and Agree with Plan of Care Patient             Patient will benefit from skilled therapeutic intervention in order to improve the following deficits and impairments:  Abnormal gait, Decreased range of motion, Difficulty walking, Impaired UE functional use, Pain, Decreased activity tolerance, Decreased balance, Impaired flexibility, Improper body mechanics, Postural dysfunction, Impaired sensation, Decreased strength, Decreased mobility  Visit Diagnosis: Other abnormalities of gait and mobility  Muscle weakness (generalized)  Unsteadiness on feet     Problem List Patient Active Problem List   Diagnosis Date Noted   Contracture, left foot 09/20/2020   Neuropathy 09/20/2020   Osteoporosis 09/20/2020   Diabetic ketoacidosis without coma associated with type 2 diabetes mellitus (HNorth [place] 08/11/2020   Seizure (H[place] 08/11/2020   Cerebrovascular disease 08/11/2020   Essential hypertension 08/11/2020   Gastro-esophageal reflux disease without esophagitis 08/11/2020   History of completed stroke 08/11/2020   Hyperglycemia due to type 2 diabetes mellitus (HS[place] 08/11/2020   Mixed hyperlipidemia 08/11/2020   Hyperthyroidism 03/11/2019   Diabetes (HB[place] 08/12/2018   Palpitations 04/16/2017   Shortness of breath 04/16/2017   Abnormal INR 01/04/2016   Encounter for therapeutic drug level monitoring 08/31/2015   Long term  current use of anticoagulant therapy 08/31/2015   Spastic hemiplegia affecting nondominant side (HGarcon [place] 10/15/2012   Myalgia and myositis, unspecified 10/15/2012   Cerebral thrombosis with cerebral infarction (HH[place] 07/01/2012   Localization-related (focal) (partial) epilepsy and epileptic syndromes with complex partial seizures, without mention of intractable epilepsy 07/01/2012   Extremity pain 07/01/2012   Partial epilepsy with impairment of consciousness, intractable (H[place] 07/01/2012   CVA (cerebral infarction) 02/11/2012   Contracture of left Achilles tendon 02/11/2012    EElecta Sniff PT, DPT, NCS 09/13/2021, 8:28 PM  [place] [address]Phone: [phone]  Fax:  [phone] Name: VKinsie BelfordMRN: [id number] of Birth: 1Sep 22, 1962

## 2021-09-20 ENCOUNTER — Other Ambulatory Visit: Payer: Self-pay

## 2021-09-20 ENCOUNTER — Ambulatory Visit: Payer: Medicare Other

## 2021-09-20 DIAGNOSIS — M6281 Muscle weakness (generalized): Secondary | ICD-10-CM | POA: Diagnosis not present

## 2021-09-20 DIAGNOSIS — R2681 Unsteadiness on feet: Secondary | ICD-10-CM | POA: Diagnosis not present

## 2021-09-20 DIAGNOSIS — R2689 Other abnormalities of gait and mobility: Secondary | ICD-10-CM | POA: Diagnosis not present

## 2021-09-20 DIAGNOSIS — R293 Abnormal posture: Secondary | ICD-10-CM | POA: Diagnosis not present

## 2021-09-20 NOTE — Therapy (Signed)
[facility] [address] [place], [place], [address] Phone: [phone]   Fax:  [phone]  Physical Therapy Treatment/Progress note  Patient Details  Name: Kimberly [name] MRN: [id number] Date of Birth: [birth year] Referring Provider (PT): Trey Sailors, [place]   Progress Note  Reporting period 07/19/21 to 09/20/21  See Note below for Objective Data and Assessment of Progress/Goals   Encounter Date: 09/20/2021   PT End of Session - 09/20/21 1458     Visit Number 20    Number of Visits 24    Date for PT Re-Evaluation 10/06/21    Authorization Type [employer] [place] / MCD    Progress Note [name] on Visit 20    PT Start Time 4827   pt had to use bathroom first   PT Stop Time 1533    PT Time Calculation (min) 36 min    Equipment Utilized During Treatment Other (comment);Gait belt   left solid ankle custom AFO   Activity Tolerance Patient tolerated treatment well    Behavior During Therapy [facility] for tasks assessed/performed             Past Medical History:  Diagnosis Date   Diabetes mellitus    Hypertension    Seizures ([place])    last seizure march 2016   Stroke [facility])     Past Surgical History:  Procedure Laterality Date   TRACHEOSTOMY CLOSURE      There were no vitals filed for this visit.   Subjective Assessment - 09/20/21 1458     Subjective Pt denies any pain today. Pt reports doing well at home. Pt reports her exercises are hard but she has been working on them.    Patient is accompained by: Family member    Pertinent History CVA 2011    Limitations Standing;Walking;House hold activities    Patient Stated Goals Patient reports she wants to improve walking and stair negotiation    Currently in Pain? No/denies    Pain Onset In the past 7 days                               [facility] Adult PT Treatment/Exercise - 09/20/21 1500       Transfers   Transfers Sit to Stand;Stand to Sit    Sit to  Stand 5: Supervision;4: Min guard    Sit to Stand Details Verbal cues for technique    Sit to Stand Details (indicate cue type and reason) PT assisted to keep left hip in more neutral position and to position left foot correctly on a couple of the rises to try to increase LLE weight shift.    Stand to Sit 5: Supervision      Ambulation/Gait   Ambulation/Gait Yes    Ambulation/Gait Assistance 4: Min guard    Ambulation/Gait Assistance Details Pt was cued to try to slow down right step to increase left stance time on second bout with staying up taller. Pt reported right hip felt better when performed in this manner. Was able to take a more gentle step with RLE.    Ambulation Distance (Feet) 60 Feet   x 2   Assistive device Small based quad cane   left AFO   Gait Pattern Step-to pattern;Decreased arm swing - left;Decreased stance time - left;Decreased hip/knee flexion - left;Decreased weight shift to left    Ambulation Surface Level;Indoor      Neuro Re-ed  Neuro Re-ed Details  Standing in front of mirror with [facility] working on weight shift to left x 10 with verbal and tactile cues and PT blocking at left knee/hip to try to keep in more neutral position. Then arm raises on right to open up right side to stand taller x 10 again with PT blocking on left side for better position.                     PT Education - 09/20/21 2025     Education Details Discussed adding 1-2 more visit that were left in [place]. Discussed working on weight shifting at home with husband/caregiver assist and reaching up with RUE as we did in clinic. Also to focus on increasing left stance time with gait when she walks at home.    Person(s) Educated Patient;Spouse    Methods Explanation;Demonstration    Comprehension Verbalized understanding              PT Short Term Goals - 09/06/21 1630       PT SHORT TERM GOAL #1   Title Pt will be independent with progressive HEP for strengthening and functional  mobility to continue gains on own.    Baseline 09/06/21: pt has program. Unable to perform all of them [name] to decreased assistance at home. Revised supine single leg pillow squeeze to in seated position to allow pt to self perform.    Status Partially Met      PT SHORT TERM GOAL #2   Title Decision will be made on if truly has more new AFO need.    Baseline 09/06/21: per PT last note pt does not appear to need a new AFO.    Status Achieved      PT SHORT TERM GOAL #3   Title Pt will be able to perform sit to stand with at least 30% weight shift on left.    Baseline 09/07/21: Pt appears to place weight on left LE with standing    Time --    Period --    Status Partially Met    Target Date --      PT SHORT TERM GOAL #4   Title Pt will increase gait speed from 0.64ms to >0.246m for improved household mobility.    Baseline 09/06/21: 0.14 m/s, pt able to perform entire distance this session    Time --    Period --    Status Partially Met    Target Date --               PT Long Term Goals - 08/10/21 1639       PT LONG TERM GOAL #1   Title Pt will ambulate >150' with SB[facility] Glenoaksupervision with AFO for improved household mobility.    Baseline 08/09/21 5545with SBQC before needing to rest    Time 8    Period Weeks    Status New    Target Date 10/06/21      PT LONG TERM GOAL #2   Title Pt will increase Berg from 31 to >35/56 for improved balance.    Baseline baseline in October was 31/56    Time 8    Period Weeks    Status New    Target Date 10/06/21      PT LONG TERM GOAL #3   Title Patient will be able to negotiate 4 steps using single rail min assist for improved community access    Baseline 08/09/21 4 steps  with max assist with right rail    Time 8    Period Weeks    Status Revised    Target Date 10/06/21                   Plan - 09/20/21 2027     Clinical Impression Statement Pt able to increase left stance time with slowing right step after cuing. Decreased  pain in right hip when focused on this. Continued to focus on left weight shift activities with opening up right side as well. Pt will benefit from continued PT to continue to progress towards goals.    Personal Factors and Comorbidities Fitness;Past/Current Experience;Time since onset of injury/illness/exacerbation;Transportation;Comorbidity 3+    Comorbidities CVA, DM, BMI, epilepsy    Examination-Activity Limitations Locomotion Level;Transfers;Reach Overhead;Squat;Stairs;Stand;Carry;Bed Mobility;Bathing;Dressing    Examination-Participation Restrictions Meal Prep;Cleaning;Occupation;Community Activity;Driving;Shop;Laundry;Yard Work    Merchant [employer] officer Evolving/Moderate complexity    Rehab Potential Fair    PT Frequency 1x / week    PT Duration 8 weeks    PT Treatment/Interventions ADLs/Self Care Home Management;Aquatic Therapy;Cryotherapy;Electrical Stimulation;Iontophoresis 41m/ml Dexamethasone;Moist Heat;Traction;Ultrasound;DME Instruction;Neuromuscular re-education;Balance training;Therapeutic exercise;Therapeutic activities;Functional mobility training;Stair training;Gait training;Patient/family education;Orthotic Fit/Training;Manual techniques;Passive range of motion;Dry needling;Joint Manipulations;Spinal Manipulations;Vasopneumatic Device;Taping    PT Next Visit Plan If unable to get a visit on schedule next week will be d/c visit the following. Check goals and finalize HEP. Weight shifting activities over left leg, step negotiation and gait.    PT Home Exercise Plan [id number]    Consulted and Agree with Plan of Care Patient             Patient will benefit from skilled therapeutic intervention in order to improve the following deficits and impairments:  Abnormal gait, Decreased range of motion, Difficulty walking, Impaired UE functional use, Pain, Decreased activity tolerance, Decreased balance, Impaired flexibility, Improper body mechanics, Postural dysfunction,  Impaired sensation, Decreased strength, Decreased mobility  Visit Diagnosis: Other abnormalities of gait and mobility  Muscle weakness (generalized)     Problem List Patient Active Problem List   Diagnosis Date Noted   Contracture, left foot 09/20/2020   Neuropathy 09/20/2020   Osteoporosis 09/20/2020   Diabetic ketoacidosis without coma associated with type 2 diabetes mellitus ([place] 08/11/2020   Seizure ([place] 08/11/2020   Cerebrovascular disease 08/11/2020   Essential hypertension 08/11/2020   Gastro-esophageal reflux disease without esophagitis 08/11/2020   History of completed stroke 08/11/2020   Hyperglycemia [name] to type 2 diabetes mellitus (HH[place] 08/11/2020   Mixed hyperlipidemia 08/11/2020   Hyperthyroidism 03/11/2019   Diabetes ([place] 08/12/2018   Palpitations 04/16/2017   Shortness of breath 04/16/2017   Abnormal INR 01/04/2016   Encounter for therapeutic drug level monitoring 08/31/2015   Long term current use of anticoagulant therapy 08/31/2015   Spastic hemiplegia affecting nondominant side (HC[place] 10/15/2012   Myalgia and myositis, unspecified 10/15/2012   Cerebral thrombosis with cerebral infarction ([place] 07/01/2012   Localization-related (focal) (partial) epilepsy and epileptic syndromes with complex partial seizures, without mention of intractable epilepsy 07/01/2012   Extremity pain 07/01/2012   Partial epilepsy with impairment of consciousness, intractable (H[place] 07/01/2012   CVA (cerebral infarction) 02/11/2012   Contracture of left Achilles tendon 02/11/2012    EElecta Sniff PT, DPT, NCS 09/20/2021, 8:32 PM  [place]Fl[address] [place] [address]Phone: [phone]  Fax:  [phone] Name: Kimberly DueMRN: [id number] of Birth: 1May 20, 1962

## 2021-09-21 DIAGNOSIS — I6330 Cerebral infarction due to thrombosis of unspecified cerebral artery: Secondary | ICD-10-CM | POA: Diagnosis not present

## 2021-09-21 DIAGNOSIS — Z7901 Long term (current) use of anticoagulants: Secondary | ICD-10-CM | POA: Diagnosis not present

## 2021-09-21 DIAGNOSIS — Z5181 Encounter for therapeutic drug level monitoring: Secondary | ICD-10-CM | POA: Diagnosis not present

## 2021-09-21 DIAGNOSIS — R791 Abnormal coagulation profile: Secondary | ICD-10-CM | POA: Diagnosis not present

## 2021-10-06 ENCOUNTER — Other Ambulatory Visit: Payer: Self-pay

## 2021-10-06 ENCOUNTER — Ambulatory Visit: Payer: Medicare Other | Attending: Physician Assistant

## 2021-10-06 DIAGNOSIS — R2681 Unsteadiness on feet: Secondary | ICD-10-CM | POA: Insufficient documentation

## 2021-10-06 DIAGNOSIS — R293 Abnormal posture: Secondary | ICD-10-CM | POA: Insufficient documentation

## 2021-10-06 DIAGNOSIS — M25551 Pain in right hip: Secondary | ICD-10-CM | POA: Diagnosis not present

## 2021-10-06 DIAGNOSIS — R2689 Other abnormalities of gait and mobility: Secondary | ICD-10-CM | POA: Insufficient documentation

## 2021-10-06 DIAGNOSIS — M6281 Muscle weakness (generalized): Secondary | ICD-10-CM | POA: Diagnosis not present

## 2021-10-06 NOTE — Therapy (Signed)
[facility] [address] [place], [place], [address] Phone: [phone]   Fax:  [phone]  Physical Therapy Discharge Note  Patient Details  Name: Kimberly [name] MRN: [id number] Date of Birth: Jan 28, 1961 Referring Provider (PT): Trey Sailors, [place]   Encounter Date: 10/06/2021   PT End of Session - 10/06/21 1156     Visit Number 21    Number of Visits 24    Date for PT Re-Evaluation 10/06/21    Authorization Type [employer]HC [facility] / MCD    Progress Note Due on Visit 20    PT Start Time 0845    PT Stop Time 0930    PT Time Calculation (min) 45 min    Equipment Utilized During Treatment Other (comment);Gait belt   left solid ankle custom AFO   Activity Tolerance Patient tolerated treatment well    Behavior During Therapy [facility] for tasks assessed/performed             Past Medical History:  Diagnosis Date   Diabetes mellitus    Hypertension    Seizures ([place])    last seizure march 2016   Stroke [facility])     Past Surgical History:  Procedure Laterality Date   TRACHEOSTOMY CLOSURE      There were no vitals filed for this visit.   Subjective Assessment - 10/06/21 1157     Subjective Pt present with husband. BOth report compliance with household walking and exercises.    Patient is accompained by: Family member    Pertinent History CVA 2011    Limitations Standing;Walking;House hold activities    Patient Stated Goals Patient reports she wants to improve walking and stair negotiation    Currently in Pain? No/denies    Pain Onset In the past 7 days                [facility] PT Assessment - 10/06/21 0918       Berg Balance Test   Sit to Stand Able to stand  independently using hands    Standing Unsupported Able to stand 2 minutes with supervision    Sitting with Back Unsupported but Feet Supported on Floor or Stool Able to sit safely and securely 2 minutes    Stand to Sit Controls descent by using hands     Transfers Able to transfer safely, definite need of hands    Standing Unsupported with Eyes Closed Able to stand 10 seconds with supervision    Standing Unsupported with Feet Together Able to place feet together independently and stand for 1 minute with supervision    From Standing, Reach Forward with Outstretched Arm Can reach forward >12 cm safely (5")    From Standing Position, Pick up Object from [place] to pick up shoe, needs supervision    From Standing Position, Turn to Look Behind Over each Shoulder Looks behind one side only/other side shows less weight shift    Turn 360 Degrees Needs close supervision or verbal cueing    Standing Unsupported, Alternately Place Feet on Step/Stool Able to complete >2 steps/needs minimal assist    Standing Unsupported, One Foot in Front Able to take small step independently and hold 30 seconds    Standing on One Leg Unable to try or needs assist to prevent fall    Total Score 35    Berg comment: 35/56                   Stairs: 4 steps with  one rail assist, CGA to min A with ascending and max A with descending to bring forward L LE and stabilize L knee as patient brought R leg down. Pt unable to come down 4 steps and had to sit down on steps on the last step. Patient required max A to stand up from sitting on stairs to pivot transfer into her power wheelchair.  Gait training: 30', 50', 21' with quad cane and CGA  Reassessment performed today                   PT Short Term Goals - 09/06/21 1630       PT SHORT TERM GOAL #1   Title Pt will be independent with progressive HEP for strengthening and functional mobility to continue gains on own.    Baseline 09/06/21: pt has program. Unable to perform all of them due to decreased assistance at home. Revised supine single leg pillow squeeze to in seated position to allow pt to self perform.    Status Partially Met      PT SHORT TERM GOAL #2   Title Decision will be made on if  truly has more new AFO need.    Baseline 09/06/21: per PT last note pt does not appear to need a new AFO.    Status Achieved      PT SHORT TERM GOAL #3   Title Pt will be able to perform sit to stand with at least 30% weight shift on left.    Baseline 09/07/21: Pt appears to place weight on left LE with standing    Time --    Period --    Status Partially Met    Target Date --      PT SHORT TERM GOAL #4   Title Pt will increase gait speed from 0.67ms to >0.229m for improved household mobility.    Baseline 09/06/21: 0.14 m/s, pt able to perform entire distance this session    Time --    Period --    Status Partially Met    Target Date --               PT Long Term Goals - 10/06/21 0916       PT LONG TERM GOAL #1   Title Pt will ambulate >150' with SBEvangel[facility] Community Hospitalupervision with AFO for improved household mobility.    Baseline 08/09/21 55' with SBQC before needing to rest; 10/06/21 35' 50' 65' (total 150') with quad cane and 2 seated rest breaks    Time 8    Period Weeks    Status Partially Met    Target Date 10/06/21      PT LONG TERM GOAL #2   Title Pt will increase Berg from 31 to >35/56 for improved balance.    Baseline baseline in October was 31/56; 35/56 10/06/21    Time 8    Period Weeks    Status Achieved    Target Date 10/06/21      PT LONG TERM GOAL #3   Title Patient will be able to negotiate 4 steps using single rail min assist for improved community access    Baseline 08/09/21 4 steps with max assist with right rail; 10/06/21 required max A with coming down steps, CGA to min A with going up steps    Time 8    Period Weeks    Status Not Met    Target Date 10/06/21  Plan - 10/06/21 1157     Clinical Impression Statement Patient has been seen for total of 21 sessions from 06/09/21 to 10/06/21. patient has made partial progress towards her overall short term and long term goals. Patient is currently able to walk for short distances using her  quad cane and is able to negotiate home with use of quad cane and her power chair as needed. Patient has currently reached maximum potential in physical therapy and will be discharged from skilled PT with independent home exercise program to self manage her symptoms.    Personal Factors and Comorbidities Fitness;Past/Current Experience;Time since onset of injury/illness/exacerbation;Transportation;Comorbidity 3+    Comorbidities CVA, DM, BMI, epilepsy    Examination-Activity Limitations Locomotion Level;Transfers;Reach Overhead;Squat;Stairs;Stand;Carry;Bed Mobility;Bathing;Dressing    Examination-Participation Restrictions Meal Prep;Cleaning;Occupation;Community Activity;Driving;Shop;Laundry;Yard Work    Merchant navy officer Evolving/Moderate complexity    Rehab Potential Fair    PT Frequency 1x / week    PT Duration 8 weeks    PT Treatment/Interventions ADLs/Self Care Home Management;Aquatic Therapy;Cryotherapy;Electrical Stimulation;Iontophoresis 39m/ml Dexamethasone;Moist Heat;Traction;Ultrasound;DME Instruction;Neuromuscular re-education;Balance training;Therapeutic exercise;Therapeutic activities;Functional mobility training;Stair training;Gait training;Patient/family education;Orthotic Fit/Training;Manual techniques;Passive range of motion;Dry needling;Joint Manipulations;Spinal Manipulations;Vasopneumatic Device;Taping    PT Next Visit Plan d/c    PT Home Exercise Plan [id number]    Consulted and Agree with Plan of Care Patient             Patient will benefit from skilled therapeutic intervention in order to improve the following deficits and impairments:  Abnormal gait, Decreased range of motion, Difficulty walking, Impaired UE functional use, Pain, Decreased activity tolerance, Decreased balance, Impaired flexibility, Improper body mechanics, Postural dysfunction, Impaired sensation, Decreased strength, Decreased mobility  Visit Diagnosis: Other abnormalities of gait and  mobility  Muscle weakness (generalized)  Unsteadiness on feet  Abnormal posture  Balance disorder  Pain in right hip     Problem List Patient Active Problem List   Diagnosis Date Noted   Contracture, left foot 09/20/2020   Neuropathy 09/20/2020   Osteoporosis 09/20/2020   Diabetic ketoacidosis without coma associated with type 2 diabetes mellitus (HPratt[place] 08/11/2020   Seizure (HD[place] 08/11/2020   Cerebrovascular disease 08/11/2020   Essential hypertension 08/11/2020   Gastro-esophageal reflux disease without esophagitis 08/11/2020   History of completed stroke 08/11/2020   Hyperglycemia due to type 2 diabetes mellitus (HH[place] 08/11/2020   Mixed hyperlipidemia 08/11/2020   Hyperthyroidism 03/11/2019   Diabetes ([place] 08/12/2018   Palpitations 04/16/2017   Shortness of breath 04/16/2017   Abnormal INR 01/04/2016   Encounter for therapeutic drug level monitoring 08/31/2015   Long term current use of anticoagulant therapy 08/31/2015   Spastic hemiplegia affecting nondominant side ([place] 10/15/2012   Myalgia and myositis, unspecified 10/15/2012   Cerebral thrombosis with cerebral infarction ([place] 07/01/2012   Localization-related (focal) (partial) epilepsy and epileptic syndromes with complex partial seizures, without mention of intractable epilepsy 07/01/2012   Extremity pain 07/01/2012   Partial epilepsy with impairment of consciousness, intractable ([place] 07/01/2012   CVA (cerebral infarction) 02/11/2012   Contracture of left Achilles tendon 02/11/2012    KKerrie Pleasure PT 10/06/2021, 12:00 PM  [place]SMartinsvilleGClif[place] [address]Phone: [phone]  Fax:  [phone] Name: VJulissa BrowningMRN: [id number] of Birth: 1December 17, 1962

## 2021-10-10 DIAGNOSIS — I6990 Unspecified sequelae of unspecified cerebrovascular disease: Secondary | ICD-10-CM | POA: Diagnosis not present

## 2021-10-12 DIAGNOSIS — I6330 Cerebral infarction due to thrombosis of unspecified cerebral artery: Secondary | ICD-10-CM | POA: Diagnosis not present

## 2021-10-12 DIAGNOSIS — Z7901 Long term (current) use of anticoagulants: Secondary | ICD-10-CM | POA: Diagnosis not present

## 2021-10-12 DIAGNOSIS — Z5181 Encounter for therapeutic drug level monitoring: Secondary | ICD-10-CM | POA: Diagnosis not present

## 2021-10-14 IMAGING — DX DG CHEST 2V
2 series · 2 of 2 positions shown · non-contrast
Comparison: Chest x-ray 12/24/2017.

CLINICAL DATA: Cough and shortness of breath.

EXAM:
CHEST - 2 VIEW

[chest lat]
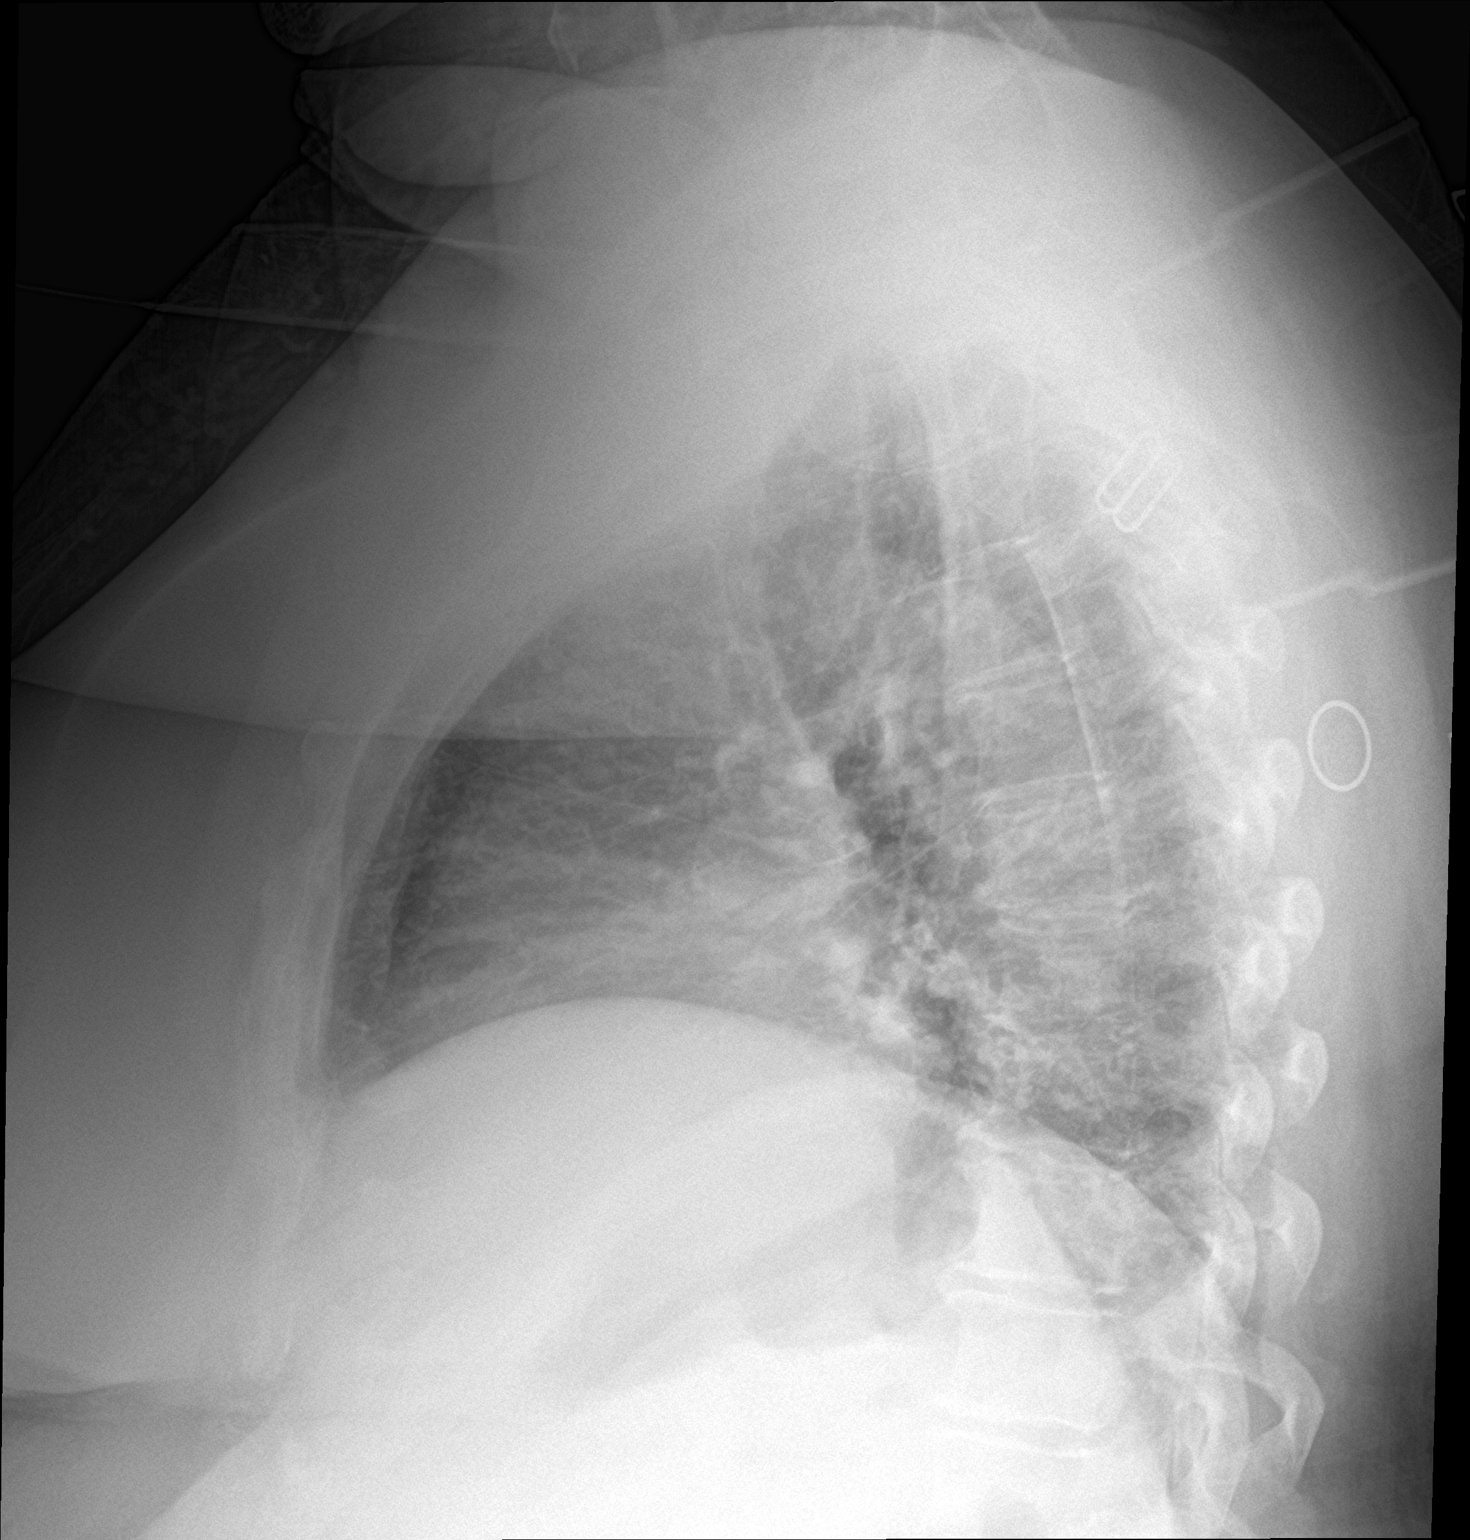

[chest ap]
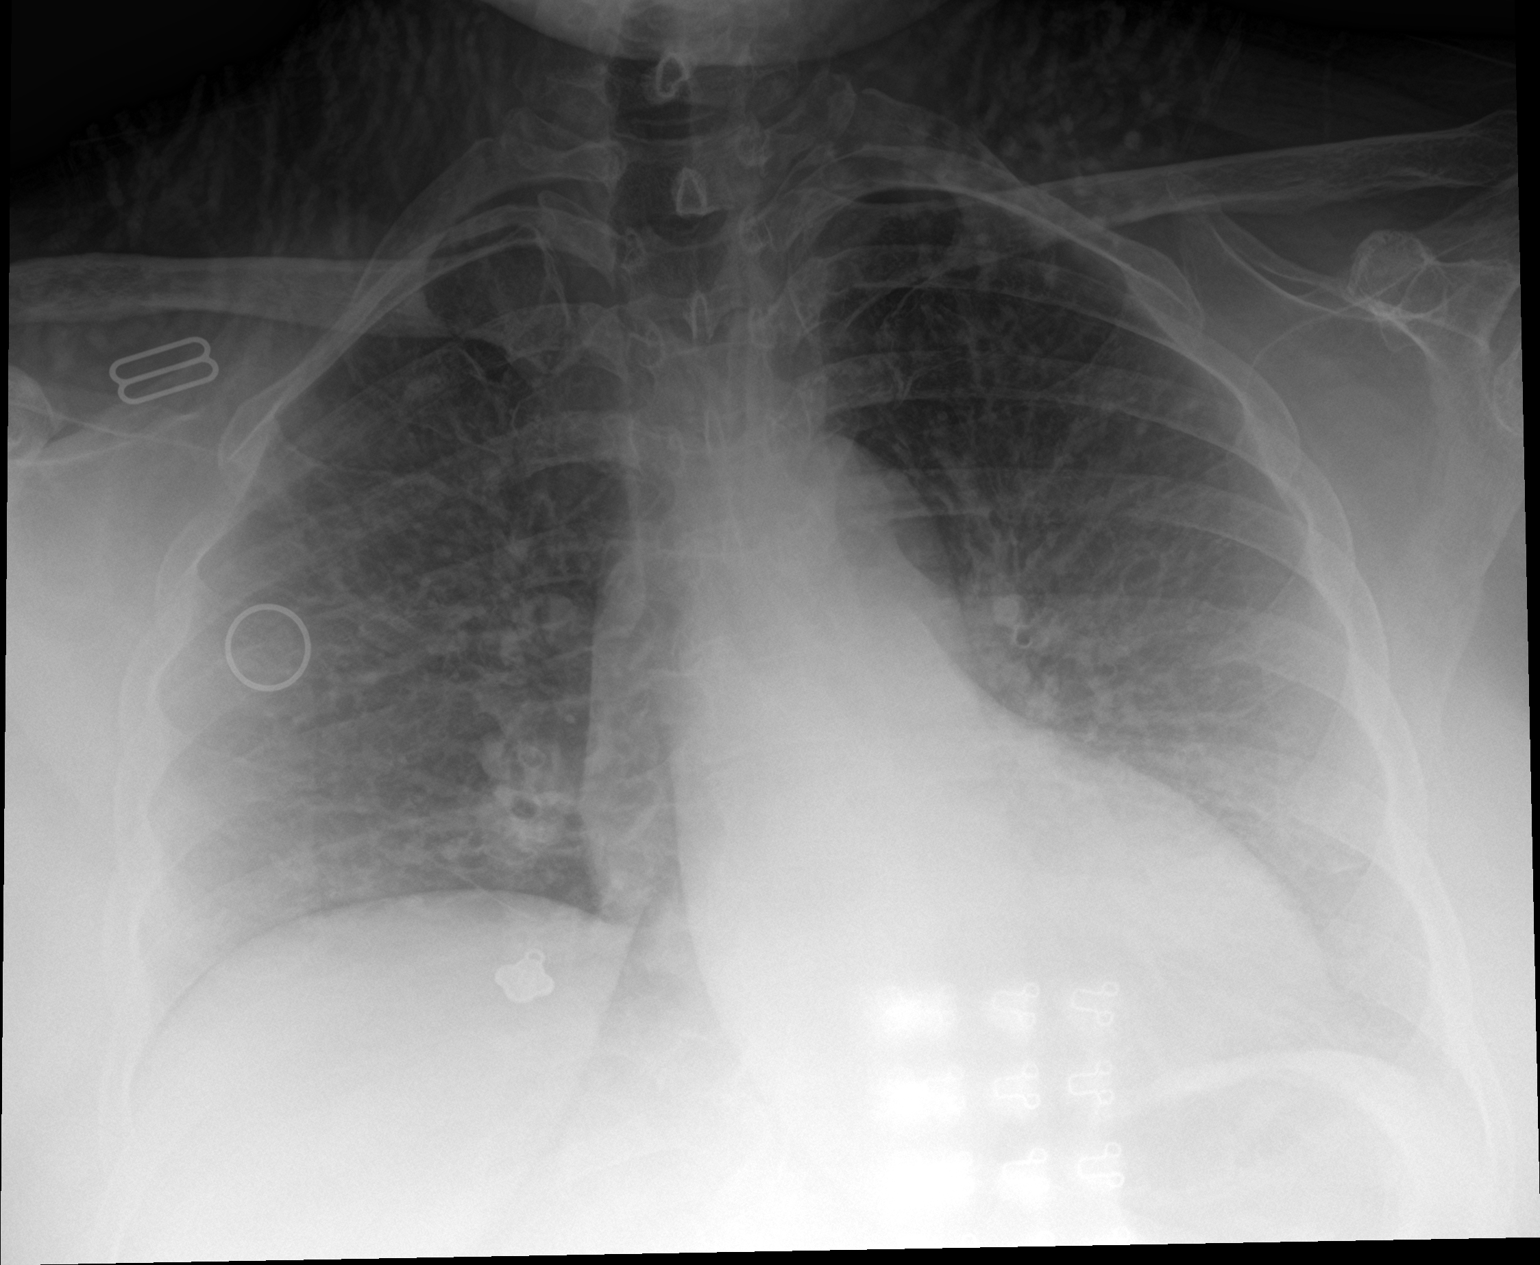

[2 of 2 positions shown; findings below may reference images not displayed]

FINDINGS: The heart is mildly enlarged. There is central pulmonary vascular
congestion. There is no lung consolidation, pleural effusion or
pneumothorax. No acute fractures are seen.
IMPRESSION: Cardiomegaly with central pulmonary vascular congestion.

## 2021-10-16 ENCOUNTER — Encounter: Payer: Self-pay | Admitting: Podiatry

## 2021-10-16 ENCOUNTER — Other Ambulatory Visit: Payer: Self-pay

## 2021-10-16 ENCOUNTER — Ambulatory Visit (INDEPENDENT_AMBULATORY_CARE_PROVIDER_SITE_OTHER): Payer: Medicare Other | Admitting: Podiatry

## 2021-10-16 DIAGNOSIS — B351 Tinea unguium: Secondary | ICD-10-CM | POA: Diagnosis not present

## 2021-10-16 DIAGNOSIS — M79675 Pain in left toe(s): Secondary | ICD-10-CM

## 2021-10-16 DIAGNOSIS — M79674 Pain in right toe(s): Secondary | ICD-10-CM | POA: Diagnosis not present

## 2021-10-16 DIAGNOSIS — G8110 Spastic hemiplegia affecting unspecified side: Secondary | ICD-10-CM

## 2021-10-16 DIAGNOSIS — E1142 Type 2 diabetes mellitus with diabetic polyneuropathy: Secondary | ICD-10-CM | POA: Diagnosis not present

## 2021-10-19 DIAGNOSIS — E083213 Diabetes mellitus due to underlying condition with mild nonproliferative diabetic retinopathy with macular edema, bilateral: Secondary | ICD-10-CM | POA: Diagnosis not present

## 2021-10-19 DIAGNOSIS — H401121 Primary open-angle glaucoma, left eye, mild stage: Secondary | ICD-10-CM | POA: Diagnosis not present

## 2021-10-19 DIAGNOSIS — H53413 Scotoma involving central area, bilateral: Secondary | ICD-10-CM | POA: Diagnosis not present

## 2021-10-19 DIAGNOSIS — H35033 Hypertensive retinopathy, bilateral: Secondary | ICD-10-CM | POA: Diagnosis not present

## 2021-10-19 DIAGNOSIS — H401112 Primary open-angle glaucoma, right eye, moderate stage: Secondary | ICD-10-CM | POA: Diagnosis not present

## 2021-10-22 NOTE — Progress Notes (Signed)
Subjective: Kimberly [name] is a 61 y.o. female patient seen today for follow up of  at risk foot care with history of diabetic neuropathy and thick, elongated toenails b/l feet which are tender when wearing enclosed shoe gear..   Patient states blood glucose was 181 mg/dl today.   Patient is accompanied by her husband and her caregiver on today's visit.  She states one of her toes were sore after her last visit with me. She applied antibiotic cream until it resolved.  She and her husband will be celebrating their 33rd wedding anniversary soon.   PCP is Trey Sailors, [place]. Last visit was: 07/26/2021.  No Known Allergies  Objective: Physical Exam  General: Patient is a pleasant 61 y.o. African American female in NAD. AAO x 3.   Neurovascular Examination: CFT <3 seconds b/l LE. Palpable pedal pulses b/l LE. Pedal hair absent. No pain with calf compression b/l. Lower extremity skin temperature gradient within normal limits. Dependent edema noted b/l LE. No cyanosis or clubbing noted b/l LE.  Protective sensation decreased with 10 gram monofilament b/l. Vibratory sensation diminished b/l.  Dermatological:  Pedal skin is warm and supple b/l LE. No open wounds b/l LE. No interdigital macerations noted b/l LE. Toenails 1-5 b/l elongated, discolored, dystrophic, thickened, crumbly with subungual debris and tenderness to dorsal palpation. No hyperkeratotic nor porokeratotic lesions present on today's visit.  Musculoskeletal:  Wearing AFO on right lower extremity. Hemiplegia RLE. Equinus LLE. No pain, crepitus or joint limitation noted with ROM bilateral LE. Utilizes motorized chair for mobility assistance.  Assessment: 1. Pain due to onychomycosis of toenails of both feet   2. Spastic hemiplegia affecting nondominant side ([place])   3. Diabetic peripheral neuropathy associated with type 2 diabetes mellitus ([place])    Plan: Patient was evaluated and treated and all questions answered. Consent  given for treatment as described below: -Toenails 1-5 b/l were debrided in length and girth with sterile nail nippers and dremel without iatrogenic bleeding.  -Patient/POA to call should there be question/concern in the interim.  Return in about 3 months (around 01/13/2022).  Marzetta Board, DPM

## 2021-11-09 DIAGNOSIS — Z5181 Encounter for therapeutic drug level monitoring: Secondary | ICD-10-CM | POA: Diagnosis not present

## 2021-11-09 DIAGNOSIS — I6330 Cerebral infarction due to thrombosis of unspecified cerebral artery: Secondary | ICD-10-CM | POA: Diagnosis not present

## 2021-11-09 DIAGNOSIS — R791 Abnormal coagulation profile: Secondary | ICD-10-CM | POA: Diagnosis not present

## 2021-11-09 DIAGNOSIS — Z7901 Long term (current) use of anticoagulants: Secondary | ICD-10-CM | POA: Diagnosis not present

## 2021-12-07 DIAGNOSIS — Z5181 Encounter for therapeutic drug level monitoring: Secondary | ICD-10-CM | POA: Diagnosis not present

## 2021-12-07 DIAGNOSIS — Z7901 Long term (current) use of anticoagulants: Secondary | ICD-10-CM | POA: Diagnosis not present

## 2021-12-07 DIAGNOSIS — I6330 Cerebral infarction due to thrombosis of unspecified cerebral artery: Secondary | ICD-10-CM | POA: Diagnosis not present

## 2021-12-11 ENCOUNTER — Other Ambulatory Visit: Payer: Self-pay | Admitting: Endocrinology

## 2021-12-14 ENCOUNTER — Ambulatory Visit: Payer: Commercial Managed Care - HMO | Admitting: Endocrinology

## 2021-12-20 DIAGNOSIS — I6789 Other cerebrovascular disease: Secondary | ICD-10-CM | POA: Diagnosis not present

## 2021-12-20 DIAGNOSIS — Z0000 Encounter for general adult medical examination without abnormal findings: Secondary | ICD-10-CM | POA: Diagnosis not present

## 2021-12-20 DIAGNOSIS — E1165 Type 2 diabetes mellitus with hyperglycemia: Secondary | ICD-10-CM | POA: Diagnosis not present

## 2021-12-20 DIAGNOSIS — I10 Essential (primary) hypertension: Secondary | ICD-10-CM | POA: Diagnosis not present

## 2021-12-20 DIAGNOSIS — R569 Unspecified convulsions: Secondary | ICD-10-CM | POA: Diagnosis not present

## 2021-12-20 DIAGNOSIS — I69053 Hemiplegia and hemiparesis following nontraumatic subarachnoid hemorrhage affecting right non-dominant side: Secondary | ICD-10-CM | POA: Diagnosis not present

## 2021-12-20 DIAGNOSIS — E782 Mixed hyperlipidemia: Secondary | ICD-10-CM | POA: Diagnosis not present

## 2022-01-02 DIAGNOSIS — I10 Essential (primary) hypertension: Secondary | ICD-10-CM | POA: Diagnosis not present

## 2022-01-02 DIAGNOSIS — G8190 Hemiplegia, unspecified affecting unspecified side: Secondary | ICD-10-CM | POA: Diagnosis not present

## 2022-01-02 DIAGNOSIS — I6789 Other cerebrovascular disease: Secondary | ICD-10-CM | POA: Diagnosis not present

## 2022-01-03 ENCOUNTER — Other Ambulatory Visit: Payer: Self-pay

## 2022-01-03 DIAGNOSIS — E119 Type 2 diabetes mellitus without complications: Secondary | ICD-10-CM

## 2022-01-03 DIAGNOSIS — Z794 Long term (current) use of insulin: Secondary | ICD-10-CM

## 2022-01-03 MED ORDER — ACCU-CHEK GUIDE ME W/DEVICE KIT
PACK | 0 refills | Status: AC
Start: 2022-01-03 — End: ?

## 2022-01-03 MED ORDER — ACCU-CHEK SOFTCLIX LANCETS MISC: 3 refills | Status: AC

## 2022-01-04 DIAGNOSIS — I6330 Cerebral infarction due to thrombosis of unspecified cerebral artery: Secondary | ICD-10-CM | POA: Diagnosis not present

## 2022-01-04 DIAGNOSIS — Z7901 Long term (current) use of anticoagulants: Secondary | ICD-10-CM | POA: Diagnosis not present

## 2022-01-04 DIAGNOSIS — Z5181 Encounter for therapeutic drug level monitoring: Secondary | ICD-10-CM | POA: Diagnosis not present

## 2022-01-04 DIAGNOSIS — R791 Abnormal coagulation profile: Secondary | ICD-10-CM | POA: Diagnosis not present

## 2022-01-09 DIAGNOSIS — I10 Essential (primary) hypertension: Secondary | ICD-10-CM | POA: Diagnosis not present

## 2022-01-09 DIAGNOSIS — Z780 Asymptomatic menopausal state: Secondary | ICD-10-CM | POA: Diagnosis not present

## 2022-01-09 DIAGNOSIS — I6789 Other cerebrovascular disease: Secondary | ICD-10-CM | POA: Diagnosis not present

## 2022-01-09 DIAGNOSIS — M8588 Other specified disorders of bone density and structure, other site: Secondary | ICD-10-CM | POA: Diagnosis not present

## 2022-01-09 DIAGNOSIS — M810 Age-related osteoporosis without current pathological fracture: Secondary | ICD-10-CM | POA: Diagnosis not present

## 2022-01-09 DIAGNOSIS — G8190 Hemiplegia, unspecified affecting unspecified side: Secondary | ICD-10-CM | POA: Diagnosis not present

## 2022-01-15 DIAGNOSIS — Z1231 Encounter for screening mammogram for malignant neoplasm of breast: Secondary | ICD-10-CM | POA: Diagnosis not present

## 2022-01-16 ENCOUNTER — Ambulatory Visit: Payer: Medicare Other | Admitting: Podiatry

## 2022-01-23 DIAGNOSIS — H35033 Hypertensive retinopathy, bilateral: Secondary | ICD-10-CM | POA: Diagnosis not present

## 2022-01-23 DIAGNOSIS — H401121 Primary open-angle glaucoma, left eye, mild stage: Secondary | ICD-10-CM | POA: Diagnosis not present

## 2022-01-23 DIAGNOSIS — H401112 Primary open-angle glaucoma, right eye, moderate stage: Secondary | ICD-10-CM | POA: Diagnosis not present

## 2022-01-23 DIAGNOSIS — H53431 Sector or arcuate defects, right eye: Secondary | ICD-10-CM | POA: Diagnosis not present

## 2022-01-31 DIAGNOSIS — Z5181 Encounter for therapeutic drug level monitoring: Secondary | ICD-10-CM | POA: Diagnosis not present

## 2022-01-31 DIAGNOSIS — I6330 Cerebral infarction due to thrombosis of unspecified cerebral artery: Secondary | ICD-10-CM | POA: Diagnosis not present

## 2022-01-31 DIAGNOSIS — Z7901 Long term (current) use of anticoagulants: Secondary | ICD-10-CM | POA: Diagnosis not present

## 2022-02-28 DIAGNOSIS — Z5181 Encounter for therapeutic drug level monitoring: Secondary | ICD-10-CM | POA: Diagnosis not present

## 2022-02-28 DIAGNOSIS — Z7901 Long term (current) use of anticoagulants: Secondary | ICD-10-CM | POA: Diagnosis not present

## 2022-02-28 DIAGNOSIS — I6330 Cerebral infarction due to thrombosis of unspecified cerebral artery: Secondary | ICD-10-CM | POA: Diagnosis not present

## 2022-03-19 ENCOUNTER — Encounter: Payer: Self-pay | Admitting: Podiatry

## 2022-03-19 ENCOUNTER — Ambulatory Visit (INDEPENDENT_AMBULATORY_CARE_PROVIDER_SITE_OTHER): Payer: Medicare Other | Admitting: Podiatry

## 2022-03-19 DIAGNOSIS — M79675 Pain in left toe(s): Secondary | ICD-10-CM | POA: Diagnosis not present

## 2022-03-19 DIAGNOSIS — I69354 Hemiplegia and hemiparesis following cerebral infarction affecting left non-dominant side: Secondary | ICD-10-CM | POA: Diagnosis not present

## 2022-03-19 DIAGNOSIS — E1142 Type 2 diabetes mellitus with diabetic polyneuropathy: Secondary | ICD-10-CM

## 2022-03-19 DIAGNOSIS — M79674 Pain in right toe(s): Secondary | ICD-10-CM

## 2022-03-19 DIAGNOSIS — E119 Type 2 diabetes mellitus without complications: Secondary | ICD-10-CM | POA: Diagnosis not present

## 2022-03-19 DIAGNOSIS — B351 Tinea unguium: Secondary | ICD-10-CM | POA: Diagnosis not present

## 2022-03-19 DIAGNOSIS — M21372 Foot drop, left foot: Secondary | ICD-10-CM

## 2022-03-21 DIAGNOSIS — I10 Essential (primary) hypertension: Secondary | ICD-10-CM | POA: Diagnosis not present

## 2022-03-21 DIAGNOSIS — I6789 Other cerebrovascular disease: Secondary | ICD-10-CM | POA: Diagnosis not present

## 2022-03-21 DIAGNOSIS — E782 Mixed hyperlipidemia: Secondary | ICD-10-CM | POA: Diagnosis not present

## 2022-03-21 DIAGNOSIS — I69053 Hemiplegia and hemiparesis following nontraumatic subarachnoid hemorrhage affecting right non-dominant side: Secondary | ICD-10-CM | POA: Diagnosis not present

## 2022-03-21 DIAGNOSIS — E1165 Type 2 diabetes mellitus with hyperglycemia: Secondary | ICD-10-CM | POA: Diagnosis not present

## 2022-03-21 DIAGNOSIS — R569 Unspecified convulsions: Secondary | ICD-10-CM | POA: Diagnosis not present

## 2022-03-25 ENCOUNTER — Encounter: Payer: Self-pay | Admitting: Podiatry

## 2022-03-25 NOTE — Progress Notes (Signed)
  Subjective:  Patient ID: Kimberly [name], female    DOB: 22-Apr-1961,  MRN: [id number]  Kendelle Schweers presents to clinic today for annual diabetic foot examination and thick, elongated toenails b/l lower extremities which are tender when wearing enclosed shoe gear.  Patient has 12 year h/o diabetes.  She denies any h/o wounds. Denies any numbness, tingling or burning.  Patient states blood glucose was 187 mg/dl today.  Last A1c was 8.2%.  New problem(s): None.   PCP is Trey Sailors, [place] , and last visit was April, 2023.  She is accompanied by her caregiver and her husband on today's visit.  No Known Allergies  Review of Systems: Negative except as noted in the HPI.  Objective:  General: Patient is a pleasant 61 y.o. African American female in NAD. AAO x 3.   Neurovascular Examination: CFT <3 seconds b/l LE. Palpable DP pulses b/l LE.  Nonpalpable PT pulses secondary to edema b/l. Pedal hair absent. No pain with calf compression b/l. Lower extremity skin temperature gradient within normal limits. Dependent edema noted b/l LE. No cyanosis or clubbing noted b/l LE.  Pt has subjective symptoms of neuropathy. Protective sensation intact b/l. Vibratory sensation intact b/l.  Dermatological:  Pedal skin is warm and supple b/l LE. No open wounds b/l LE. No interdigital macerations noted b/l LE. Toenails 1-5 b/l elongated, discolored, dystrophic, thickened, crumbly with subungual debris and tenderness to dorsal palpation. No hyperkeratotic nor porokeratotic lesions present on today's visit. Incurvated nailplate right great toe lateral border(s) with tenderness to palpation. No erythema, no edema, no drainage noted.   Musculoskeletal:  Wearing AFO on right lower extremity. Hemiplegia RLE. Equinus RLE. No pain, crepitus or joint limitation noted with ROM bilateral LE. Utilizes motorized chair for mobility assistance.     Latest Ref Rng & Units 09/06/2021    1:26 PM  Hemoglobin A1C   Hemoglobin-A1c 4.0 - 5.6 % 10.7    Assessment/Plan: 1. Pain due to onychomycosis of toenails of both feet   2. Left foot drop   3. Spastic hemiplegia of left nondominant side as late effect of cerebral infarction ([place])   4. Diabetic peripheral neuropathy associated with type 2 diabetes mellitus ([place])   5. Encounter for diabetic foot exam [facility])   -Patient was evaluated and treated. All patient's and/or POA's questions/concerns answered on today's visit. -Diabetic foot examination performed today. -Continue foot and shoe inspections daily. Monitor blood glucose per PCP/Endocrinologist's recommendations. -Patient to continue soft, supportive shoe gear daily. -Toenails 1-5 b/l were debrided in length and girth with sterile nail nippers and dremel without iatrogenic bleeding.  -Offending nail border debrided and curretaged right foot utilizing sterile nail nipper and currette. Border cleansed with alcohol and triple antibiotic applied. No further treatment required by patient/caregiver. Call office if there are any concerns. -Patient/POA to call should there be question/concern in the interim.   Return in about 3 months (around 06/19/2022).  Marzetta Board, DPM

## 2022-04-04 DIAGNOSIS — Z5181 Encounter for therapeutic drug level monitoring: Secondary | ICD-10-CM | POA: Diagnosis not present

## 2022-04-04 DIAGNOSIS — Z7901 Long term (current) use of anticoagulants: Secondary | ICD-10-CM | POA: Diagnosis not present

## 2022-04-04 DIAGNOSIS — I6330 Cerebral infarction due to thrombosis of unspecified cerebral artery: Secondary | ICD-10-CM | POA: Diagnosis not present

## 2022-04-18 DIAGNOSIS — I10 Essential (primary) hypertension: Secondary | ICD-10-CM | POA: Diagnosis not present

## 2022-04-18 DIAGNOSIS — I6789 Other cerebrovascular disease: Secondary | ICD-10-CM | POA: Diagnosis not present

## 2022-04-18 DIAGNOSIS — R569 Unspecified convulsions: Secondary | ICD-10-CM | POA: Diagnosis not present

## 2022-04-18 DIAGNOSIS — I69053 Hemiplegia and hemiparesis following nontraumatic subarachnoid hemorrhage affecting right non-dominant side: Secondary | ICD-10-CM | POA: Diagnosis not present

## 2022-04-18 DIAGNOSIS — E782 Mixed hyperlipidemia: Secondary | ICD-10-CM | POA: Diagnosis not present

## 2022-04-18 DIAGNOSIS — E1165 Type 2 diabetes mellitus with hyperglycemia: Secondary | ICD-10-CM | POA: Diagnosis not present

## 2022-05-03 DIAGNOSIS — H401112 Primary open-angle glaucoma, right eye, moderate stage: Secondary | ICD-10-CM | POA: Diagnosis not present

## 2022-05-03 DIAGNOSIS — H35033 Hypertensive retinopathy, bilateral: Secondary | ICD-10-CM | POA: Diagnosis not present

## 2022-05-03 DIAGNOSIS — H2513 Age-related nuclear cataract, bilateral: Secondary | ICD-10-CM | POA: Diagnosis not present

## 2022-05-03 DIAGNOSIS — H401121 Primary open-angle glaucoma, left eye, mild stage: Secondary | ICD-10-CM | POA: Diagnosis not present

## 2022-05-16 ENCOUNTER — Other Ambulatory Visit: Payer: Self-pay

## 2022-05-16 DIAGNOSIS — I6330 Cerebral infarction due to thrombosis of unspecified cerebral artery: Secondary | ICD-10-CM | POA: Diagnosis not present

## 2022-05-16 DIAGNOSIS — Z794 Long term (current) use of insulin: Secondary | ICD-10-CM

## 2022-05-16 DIAGNOSIS — Z7901 Long term (current) use of anticoagulants: Secondary | ICD-10-CM | POA: Diagnosis not present

## 2022-05-16 DIAGNOSIS — Z5181 Encounter for therapeutic drug level monitoring: Secondary | ICD-10-CM | POA: Diagnosis not present

## 2022-06-06 DIAGNOSIS — Z0001 Encounter for general adult medical examination with abnormal findings: Secondary | ICD-10-CM | POA: Diagnosis not present

## 2022-06-06 DIAGNOSIS — I6789 Other cerebrovascular disease: Secondary | ICD-10-CM | POA: Diagnosis not present

## 2022-06-06 DIAGNOSIS — I10 Essential (primary) hypertension: Secondary | ICD-10-CM | POA: Diagnosis not present

## 2022-06-06 DIAGNOSIS — E1165 Type 2 diabetes mellitus with hyperglycemia: Secondary | ICD-10-CM | POA: Diagnosis not present

## 2022-06-06 DIAGNOSIS — I69053 Hemiplegia and hemiparesis following nontraumatic subarachnoid hemorrhage affecting right non-dominant side: Secondary | ICD-10-CM | POA: Diagnosis not present

## 2022-06-06 DIAGNOSIS — R569 Unspecified convulsions: Secondary | ICD-10-CM | POA: Diagnosis not present

## 2022-06-06 DIAGNOSIS — E782 Mixed hyperlipidemia: Secondary | ICD-10-CM | POA: Diagnosis not present

## 2022-06-26 DIAGNOSIS — E782 Mixed hyperlipidemia: Secondary | ICD-10-CM | POA: Diagnosis not present

## 2022-06-26 DIAGNOSIS — I10 Essential (primary) hypertension: Secondary | ICD-10-CM | POA: Diagnosis not present

## 2022-06-26 DIAGNOSIS — E1165 Type 2 diabetes mellitus with hyperglycemia: Secondary | ICD-10-CM | POA: Diagnosis not present

## 2022-07-02 ENCOUNTER — Ambulatory Visit (INDEPENDENT_AMBULATORY_CARE_PROVIDER_SITE_OTHER): Payer: Medicare Other | Admitting: Podiatry

## 2022-07-02 ENCOUNTER — Encounter: Payer: Self-pay | Admitting: Podiatry

## 2022-07-02 DIAGNOSIS — M21372 Foot drop, left foot: Secondary | ICD-10-CM | POA: Diagnosis not present

## 2022-07-02 DIAGNOSIS — M79674 Pain in right toe(s): Secondary | ICD-10-CM | POA: Diagnosis not present

## 2022-07-02 DIAGNOSIS — I69354 Hemiplegia and hemiparesis following cerebral infarction affecting left non-dominant side: Secondary | ICD-10-CM

## 2022-07-02 DIAGNOSIS — M216X2 Other acquired deformities of left foot: Secondary | ICD-10-CM | POA: Diagnosis not present

## 2022-07-02 DIAGNOSIS — M79675 Pain in left toe(s): Secondary | ICD-10-CM

## 2022-07-02 DIAGNOSIS — E1142 Type 2 diabetes mellitus with diabetic polyneuropathy: Secondary | ICD-10-CM | POA: Diagnosis not present

## 2022-07-02 DIAGNOSIS — B351 Tinea unguium: Secondary | ICD-10-CM

## 2022-07-02 NOTE — Progress Notes (Signed)
  Subjective:  Patient ID: Saher Davee, female    DOB: [birth year],  MRN: [id number]  Cloma Rahrig presents to clinic today for at risk foot care with history of diabetic neuropathy and painful thick toenails that are difficult to trim. Pain interferes with ambulation. Aggravating factors include wearing enclosed shoe gear. Pain is relieved with periodic professional debridement.  Chief Complaint  Patient presents with   Nail Problem    RFC Bilateral nail trim 1-5.   New problem(s): None.   PCP is [name], MD. Last visit 05/21/2022.  No Known Allergies  Review of Systems: Negative except as noted in the HPI.  Objective:   Keelyn Monjaras is a pleasant 61 y.o. female in NAD. AAO x 3.  Neurovascular Examination: CFT <3 seconds b/l LE. Palpable DP pulses b/l LE.  Nonpalpable PT pulses secondary to edema b/l. Pedal hair absent. No pain with calf compression b/l. Lower extremity skin temperature gradient within normal limits. Dependent edema noted b/l LE. No cyanosis or clubbing noted b/l LE.  Pt has subjective symptoms of neuropathy. Protective sensation intact b/l. Vibratory sensation intact b/l.  Dermatological:  Pedal skin is warm and supple b/l LE. No open wounds b/l LE. No interdigital macerations noted b/l LE. Toenails 1-5 b/l elongated, discolored, dystrophic, thickened, crumbly with subungual debris and tenderness to dorsal palpation. No hyperkeratotic nor porokeratotic lesions present on today's visit.   Musculoskeletal:  Wearing AFO on left lower extremity. Dropfoot deformity secondary to CVA LLE. Hemiplegia LLE. Equinus LLE. No pain, crepitus or joint limitation noted with ROM bilateral LE. Utilizes motorized chair for mobility assistance.  Assessment/Plan: 1. Pain due to onychomycosis of toenails of both feet   2. Left foot drop   3. Spastic hemiplegia of left nondominant side as late effect of cerebral infarction ([place])   4. Acquired equinus deformity of left foot    5. Diabetic peripheral neuropathy associated with type 2 diabetes mellitus ([place])     No orders of the defined types were placed in this encounter.   -Patient's family member present. All questions/concerns addressed on today's visit. -We discussed diabetic shoes. She will need shoes deep enough to accommodate AFO of LLE. -Continue supportive shoe gear daily. -Discussed diabetic shoe benefit available based on patient's diagnoses. Patient/POA would like to proceed. Order entered for one pair extra depth shoes and 3 pair total contact insoles. Patient qualifies based on diagnoses. -Mycotic toenails 1-5 bilaterally were debrided in length and girth with sterile nail nippers and dremel without incident. -Patient/POA to call should there be question/concern in the interim.   Return in about 3 months (around 10/02/2022).  Marzetta Board, DPM

## 2022-07-04 DIAGNOSIS — E1165 Type 2 diabetes mellitus with hyperglycemia: Secondary | ICD-10-CM | POA: Diagnosis not present

## 2022-07-06 NOTE — Progress Notes (Signed)
Will get this taken care of. Thank you!

## 2022-07-25 DIAGNOSIS — I10 Essential (primary) hypertension: Secondary | ICD-10-CM | POA: Diagnosis not present

## 2022-07-25 DIAGNOSIS — E1165 Type 2 diabetes mellitus with hyperglycemia: Secondary | ICD-10-CM | POA: Diagnosis not present

## 2022-07-25 DIAGNOSIS — E782 Mixed hyperlipidemia: Secondary | ICD-10-CM | POA: Diagnosis not present

## 2022-07-25 DIAGNOSIS — Z794 Long term (current) use of insulin: Secondary | ICD-10-CM | POA: Diagnosis not present

## 2022-07-26 DIAGNOSIS — E1165 Type 2 diabetes mellitus with hyperglycemia: Secondary | ICD-10-CM | POA: Diagnosis not present

## 2022-07-26 DIAGNOSIS — E782 Mixed hyperlipidemia: Secondary | ICD-10-CM | POA: Diagnosis not present

## 2022-07-26 DIAGNOSIS — I10 Essential (primary) hypertension: Secondary | ICD-10-CM | POA: Diagnosis not present

## 2022-08-01 DIAGNOSIS — Z7901 Long term (current) use of anticoagulants: Secondary | ICD-10-CM | POA: Diagnosis not present

## 2022-08-01 DIAGNOSIS — Z5181 Encounter for therapeutic drug level monitoring: Secondary | ICD-10-CM | POA: Diagnosis not present

## 2022-08-01 DIAGNOSIS — I6330 Cerebral infarction due to thrombosis of unspecified cerebral artery: Secondary | ICD-10-CM | POA: Diagnosis not present

## 2022-08-06 ENCOUNTER — Ambulatory Visit: Payer: Medicare Other | Admitting: *Deleted

## 2022-08-06 DIAGNOSIS — E1142 Type 2 diabetes mellitus with diabetic polyneuropathy: Secondary | ICD-10-CM

## 2022-08-06 DIAGNOSIS — G8110 Spastic hemiplegia affecting unspecified side: Secondary | ICD-10-CM

## 2022-08-06 DIAGNOSIS — M216X2 Other acquired deformities of left foot: Secondary | ICD-10-CM

## 2022-08-06 NOTE — Progress Notes (Signed)
Patient presents to the office today for diabetic shoe and insole measuring.  Patient was measured with brannock device to determine size and width for 1 pair of extra depth shoes and foam casted for 3 pair of insoles.   Documentation of medical necessity will be sent to patient's treating diabetic doctor to verify and sign.   Patient's diabetic provider: Dr. Iona Beard Osei-Bonsu  Shoes and insoles will be ordered at that time and patient will be notified for an appointment for fitting when they arrive.   Shoe size (per patient): Men's 11   Brannock measurement: RIGHT: 9.5 XW, LEFT: 9.5 XW  Patient shoe selection-   Shoe choice:   [id number]  Shoe size ordered: Men's 10.5 XX-Wide  Patient has brace on left foot, increased size and width to accommodate. Will need added fillers most likely to her right shoe to allow better fit.

## 2022-08-23 ENCOUNTER — Other Ambulatory Visit: Payer: Medicare Other

## 2022-08-26 DIAGNOSIS — E1165 Type 2 diabetes mellitus with hyperglycemia: Secondary | ICD-10-CM | POA: Diagnosis not present

## 2022-08-26 DIAGNOSIS — I10 Essential (primary) hypertension: Secondary | ICD-10-CM | POA: Diagnosis not present

## 2022-08-26 DIAGNOSIS — E782 Mixed hyperlipidemia: Secondary | ICD-10-CM | POA: Diagnosis not present

## 2022-08-31 ENCOUNTER — Ambulatory Visit (INDEPENDENT_AMBULATORY_CARE_PROVIDER_SITE_OTHER): Payer: Medicare Other

## 2022-08-31 DIAGNOSIS — M216X2 Other acquired deformities of left foot: Secondary | ICD-10-CM

## 2022-08-31 DIAGNOSIS — M21372 Foot drop, left foot: Secondary | ICD-10-CM

## 2022-08-31 DIAGNOSIS — E1142 Type 2 diabetes mellitus with diabetic polyneuropathy: Secondary | ICD-10-CM

## 2022-09-08 NOTE — Progress Notes (Signed)
Patient presents today to pick up diabetic shoes and insoles.   She tried on the shoes with the insoles and the fit was not satisfactory. The shoes were accommodative for her brace but was uncomfortable on her right foot.   New shoe ordered for patient G801M size 10XW.

## 2022-09-13 NOTE — Progress Notes (Signed)
Patient presents today to pick up diabetic shoes and insoles.  Patient was dispensed 1 pair of diabetic shoes and 3 pairs of foam casted diabetic insoles.   She tried on the shoes with the insoles and the fit was satisfactory.  She will need to take these to a shoe repair to have the strap lengthened.   Will follow up next year for new order.

## 2022-09-14 ENCOUNTER — Ambulatory Visit (INDEPENDENT_AMBULATORY_CARE_PROVIDER_SITE_OTHER): Payer: 59 | Admitting: *Deleted

## 2022-09-14 DIAGNOSIS — M21372 Foot drop, left foot: Secondary | ICD-10-CM

## 2022-09-14 DIAGNOSIS — M216X2 Other acquired deformities of left foot: Secondary | ICD-10-CM

## 2022-09-14 DIAGNOSIS — E1142 Type 2 diabetes mellitus with diabetic polyneuropathy: Secondary | ICD-10-CM

## 2022-09-26 DIAGNOSIS — I10 Essential (primary) hypertension: Secondary | ICD-10-CM | POA: Diagnosis not present

## 2022-09-26 DIAGNOSIS — Z5181 Encounter for therapeutic drug level monitoring: Secondary | ICD-10-CM | POA: Diagnosis not present

## 2022-09-26 DIAGNOSIS — Z7901 Long term (current) use of anticoagulants: Secondary | ICD-10-CM | POA: Diagnosis not present

## 2022-09-26 DIAGNOSIS — E1165 Type 2 diabetes mellitus with hyperglycemia: Secondary | ICD-10-CM | POA: Diagnosis not present

## 2022-09-26 DIAGNOSIS — E782 Mixed hyperlipidemia: Secondary | ICD-10-CM | POA: Diagnosis not present

## 2022-09-26 DIAGNOSIS — I6330 Cerebral infarction due to thrombosis of unspecified cerebral artery: Secondary | ICD-10-CM | POA: Diagnosis not present

## 2022-10-03 ENCOUNTER — Ambulatory Visit (INDEPENDENT_AMBULATORY_CARE_PROVIDER_SITE_OTHER): Payer: 59

## 2022-10-03 DIAGNOSIS — Z794 Long term (current) use of insulin: Secondary | ICD-10-CM | POA: Diagnosis not present

## 2022-10-03 DIAGNOSIS — E1142 Type 2 diabetes mellitus with diabetic polyneuropathy: Secondary | ICD-10-CM

## 2022-10-03 DIAGNOSIS — Z8673 Personal history of transient ischemic attack (TIA), and cerebral infarction without residual deficits: Secondary | ICD-10-CM | POA: Diagnosis not present

## 2022-10-03 DIAGNOSIS — M21372 Foot drop, left foot: Secondary | ICD-10-CM

## 2022-10-03 DIAGNOSIS — E119 Type 2 diabetes mellitus without complications: Secondary | ICD-10-CM | POA: Diagnosis not present

## 2022-10-03 DIAGNOSIS — M216X2 Other acquired deformities of left foot: Secondary | ICD-10-CM

## 2022-10-03 DIAGNOSIS — Z7984 Long term (current) use of oral hypoglycemic drugs: Secondary | ICD-10-CM | POA: Diagnosis not present

## 2022-10-15 NOTE — Progress Notes (Signed)
Patient presented to office with concerns about the velcro straps on her diabetic shoes. Advised patient that we were unable to make strap adjustments to shoes but suggested [employer] shoe repair.   Patient will reach out to shoe repair store and as her AFO brace makes the velcro too short and unfortunately [employer] is no longer making these adjustments.

## 2022-10-19 DIAGNOSIS — Z5181 Encounter for therapeutic drug level monitoring: Secondary | ICD-10-CM | POA: Diagnosis not present

## 2022-10-19 DIAGNOSIS — Z7901 Long term (current) use of anticoagulants: Secondary | ICD-10-CM | POA: Diagnosis not present

## 2022-10-19 DIAGNOSIS — I6330 Cerebral infarction due to thrombosis of unspecified cerebral artery: Secondary | ICD-10-CM | POA: Diagnosis not present

## 2022-10-22 ENCOUNTER — Encounter: Payer: Self-pay | Admitting: Podiatry

## 2022-10-22 ENCOUNTER — Ambulatory Visit (INDEPENDENT_AMBULATORY_CARE_PROVIDER_SITE_OTHER): Payer: 59 | Admitting: Podiatry

## 2022-10-22 VITALS — BP 119/69

## 2022-10-22 DIAGNOSIS — B351 Tinea unguium: Secondary | ICD-10-CM

## 2022-10-22 DIAGNOSIS — E1142 Type 2 diabetes mellitus with diabetic polyneuropathy: Secondary | ICD-10-CM | POA: Diagnosis not present

## 2022-10-22 DIAGNOSIS — M79674 Pain in right toe(s): Secondary | ICD-10-CM | POA: Diagnosis not present

## 2022-10-22 DIAGNOSIS — M79675 Pain in left toe(s): Secondary | ICD-10-CM

## 2022-10-22 NOTE — Progress Notes (Unsigned)
  Subjective:  Patient ID: Kimberly [name], female    DOB: 1961/05/23,  MRN: [id number]  Kimberly [name] presents to clinic today for {jgcomplaint:23593}  Chief Complaint  Patient presents with   Nail Problem    [facility] BS-did not check today A1C-9.0 PCP-Osei-Bonsu& Raelyn Number PCP VST-07/25/2022   New problem(s): None. {jgcomplaint:23593}  PCP is Osei-Bonsu, Iona Beard, MD.  No Known Allergies  Review of Systems: Negative except as noted in the HPI.  Objective: No changes noted in today's physical examination. Vitals:   10/22/22 1353  BP: 119/69   Kimberly [name] is a pleasant 62 y.o. female obese in NAD. AAO x 3.  Neurovascular Examination: CFT <3 seconds b/l LE. Palpable DP pulses b/l LE.  Nonpalpable PT pulses secondary to edema b/l. Pedal hair absent. No pain with calf compression b/l. Lower extremity skin temperature gradient within normal limits. Dependent edema noted b/l LE. No cyanosis or clubbing noted b/l LE.  Pt has subjective symptoms of neuropathy. Protective sensation intact b/l. Vibratory sensation intact b/l.  Dermatological:  Pedal skin is warm and supple b/l LE. No open wounds b/l LE. No interdigital macerations noted b/l LE. Toenails 1-5 b/l elongated, discolored, dystrophic, thickened, crumbly with subungual debris and tenderness to dorsal palpation. No hyperkeratotic nor porokeratotic lesions present on today's visit.   Musculoskeletal:  Wearing AFO on left lower extremity. Dropfoot deformity secondary to CVA LLE. Hemiplegia LLE. Equinus LLE. No pain, crepitus or joint limitation noted with ROM bilateral LE. Utilizes motorized chair for mobility assistance. Assessment/Plan: 1. Pain due to onychomycosis of toenails of both feet   2. Diabetic peripheral neuropathy associated with type 2 diabetes mellitus ([place])     No orders of the defined types were placed in this encounter.   None {Jgplan:23602::"-Patient/POA to call should there be question/concern in the  interim."}   Return in about 3 months (around 01/20/2023).  Marzetta Board, DPM

## 2022-10-25 DIAGNOSIS — E782 Mixed hyperlipidemia: Secondary | ICD-10-CM | POA: Diagnosis not present

## 2022-10-25 DIAGNOSIS — E1165 Type 2 diabetes mellitus with hyperglycemia: Secondary | ICD-10-CM | POA: Diagnosis not present

## 2022-10-25 DIAGNOSIS — I10 Essential (primary) hypertension: Secondary | ICD-10-CM | POA: Diagnosis not present

## 2022-11-07 DIAGNOSIS — E1165 Type 2 diabetes mellitus with hyperglycemia: Secondary | ICD-10-CM | POA: Diagnosis not present

## 2022-11-07 DIAGNOSIS — E782 Mixed hyperlipidemia: Secondary | ICD-10-CM | POA: Diagnosis not present

## 2022-11-07 DIAGNOSIS — Z794 Long term (current) use of insulin: Secondary | ICD-10-CM | POA: Diagnosis not present

## 2022-11-07 DIAGNOSIS — I10 Essential (primary) hypertension: Secondary | ICD-10-CM | POA: Diagnosis not present

## 2022-11-07 DIAGNOSIS — Z0001 Encounter for general adult medical examination with abnormal findings: Secondary | ICD-10-CM | POA: Diagnosis not present

## 2022-11-07 DIAGNOSIS — E1142 Type 2 diabetes mellitus with diabetic polyneuropathy: Secondary | ICD-10-CM | POA: Diagnosis not present

## 2022-11-21 DIAGNOSIS — Z7901 Long term (current) use of anticoagulants: Secondary | ICD-10-CM | POA: Diagnosis not present

## 2022-11-21 DIAGNOSIS — Z5181 Encounter for therapeutic drug level monitoring: Secondary | ICD-10-CM | POA: Diagnosis not present

## 2022-11-23 DIAGNOSIS — I10 Essential (primary) hypertension: Secondary | ICD-10-CM | POA: Diagnosis not present

## 2022-11-23 DIAGNOSIS — E782 Mixed hyperlipidemia: Secondary | ICD-10-CM | POA: Diagnosis not present

## 2022-11-23 DIAGNOSIS — E1165 Type 2 diabetes mellitus with hyperglycemia: Secondary | ICD-10-CM | POA: Diagnosis not present

## 2022-12-05 DIAGNOSIS — I10 Essential (primary) hypertension: Secondary | ICD-10-CM | POA: Diagnosis not present

## 2022-12-05 DIAGNOSIS — E1165 Type 2 diabetes mellitus with hyperglycemia: Secondary | ICD-10-CM | POA: Diagnosis not present

## 2022-12-05 DIAGNOSIS — E1142 Type 2 diabetes mellitus with diabetic polyneuropathy: Secondary | ICD-10-CM | POA: Diagnosis not present

## 2022-12-05 DIAGNOSIS — E782 Mixed hyperlipidemia: Secondary | ICD-10-CM | POA: Diagnosis not present

## 2022-12-06 LAB — COMPREHENSIVE METABOLIC PANEL: EGFR: 100

## 2022-12-06 LAB — HEMOGLOBIN A1C: A1c: 8.9

## 2022-12-11 DIAGNOSIS — Z7901 Long term (current) use of anticoagulants: Secondary | ICD-10-CM | POA: Diagnosis not present

## 2022-12-11 DIAGNOSIS — Z5181 Encounter for therapeutic drug level monitoring: Secondary | ICD-10-CM | POA: Diagnosis not present

## 2022-12-11 DIAGNOSIS — Z8673 Personal history of transient ischemic attack (TIA), and cerebral infarction without residual deficits: Secondary | ICD-10-CM | POA: Diagnosis not present

## 2022-12-19 DIAGNOSIS — E1165 Type 2 diabetes mellitus with hyperglycemia: Secondary | ICD-10-CM | POA: Diagnosis not present

## 2022-12-19 DIAGNOSIS — Z7984 Long term (current) use of oral hypoglycemic drugs: Secondary | ICD-10-CM | POA: Diagnosis not present

## 2022-12-24 DIAGNOSIS — E1142 Type 2 diabetes mellitus with diabetic polyneuropathy: Secondary | ICD-10-CM | POA: Diagnosis not present

## 2022-12-24 DIAGNOSIS — E782 Mixed hyperlipidemia: Secondary | ICD-10-CM | POA: Diagnosis not present

## 2022-12-24 DIAGNOSIS — Z794 Long term (current) use of insulin: Secondary | ICD-10-CM | POA: Diagnosis not present

## 2022-12-24 DIAGNOSIS — I10 Essential (primary) hypertension: Secondary | ICD-10-CM | POA: Diagnosis not present

## 2022-12-24 DIAGNOSIS — E1165 Type 2 diabetes mellitus with hyperglycemia: Secondary | ICD-10-CM | POA: Diagnosis not present

## 2022-12-25 DIAGNOSIS — E782 Mixed hyperlipidemia: Secondary | ICD-10-CM | POA: Diagnosis not present

## 2022-12-25 DIAGNOSIS — E1165 Type 2 diabetes mellitus with hyperglycemia: Secondary | ICD-10-CM | POA: Diagnosis not present

## 2022-12-25 DIAGNOSIS — I10 Essential (primary) hypertension: Secondary | ICD-10-CM | POA: Diagnosis not present

## 2023-01-25 DIAGNOSIS — E1165 Type 2 diabetes mellitus with hyperglycemia: Secondary | ICD-10-CM | POA: Diagnosis not present

## 2023-01-25 DIAGNOSIS — I10 Essential (primary) hypertension: Secondary | ICD-10-CM | POA: Diagnosis not present

## 2023-01-25 DIAGNOSIS — E782 Mixed hyperlipidemia: Secondary | ICD-10-CM | POA: Diagnosis not present

## 2023-02-08 DIAGNOSIS — Z7901 Long term (current) use of anticoagulants: Secondary | ICD-10-CM | POA: Diagnosis not present

## 2023-02-08 DIAGNOSIS — Z5181 Encounter for therapeutic drug level monitoring: Secondary | ICD-10-CM | POA: Diagnosis not present

## 2023-02-12 ENCOUNTER — Ambulatory Visit (INDEPENDENT_AMBULATORY_CARE_PROVIDER_SITE_OTHER): Payer: 59 | Admitting: Podiatry

## 2023-02-12 ENCOUNTER — Encounter: Payer: Self-pay | Admitting: Podiatry

## 2023-02-12 VITALS — BP 126/78 | HR 86

## 2023-02-12 DIAGNOSIS — B351 Tinea unguium: Secondary | ICD-10-CM

## 2023-02-12 DIAGNOSIS — M79675 Pain in left toe(s): Secondary | ICD-10-CM

## 2023-02-12 DIAGNOSIS — E1142 Type 2 diabetes mellitus with diabetic polyneuropathy: Secondary | ICD-10-CM | POA: Diagnosis not present

## 2023-02-12 DIAGNOSIS — M79674 Pain in right toe(s): Secondary | ICD-10-CM | POA: Diagnosis not present

## 2023-02-17 NOTE — Progress Notes (Signed)
  Subjective:  Patient ID: Kimberly [name], female    DOB: 1961-01-26,  MRN: [id number]  Kimberly [name] presents to clinic today for at risk foot care with history of diabetic neuropathy and painful thick toenails that are difficult to trim. Pain interferes with ambulation. Aggravating factors include wearing enclosed shoe gear. Pain is relieved with periodic professional debridement.  Chief Complaint  Patient presents with   Diabetes    RFC Diabetic Patient not know her morning sugar numbers Last A1C: 9.0 on 10/03/2022 Last PCP Visit: 11/07/2022   New problem(s):  Patient relates the padding on her AFO of LLE is deformed and folds when placing it in her shoe  PCP is Osei-Bonsu, Greggory Stallion, MD.  No Known Allergies  Review of Systems: Negative except as noted in the HPI.  Objective: No changes noted in today's physical examination. Vitals:   02/12/23 1413  BP: 126/78  Pulse: 86  SpO2: 97%   Kimberly [name] is a pleasant 62 y.o. female in NAD. AAO x 3.  Neurovascular Examination: CFT <3 seconds b/l LE. Palpable DP pulses b/l LE.  Nonpalpable PT pulses secondary to edema b/l. Pedal hair absent. No pain with calf compression b/l. Lower extremity skin temperature gradient within normal limits. Dependent edema noted b/l LE. No cyanosis or clubbing noted b/l LE.  Pt has subjective symptoms of neuropathy. Protective sensation intact b/l. Vibratory sensation intact b/l.  Dermatological:  Pedal skin is warm and supple b/l LE. No open wounds b/l LE. No interdigital macerations noted b/l LE. Toenails 1-5 b/l elongated, discolored, dystrophic, thickened, crumbly with subungual debris and tenderness to dorsal palpation. No hyperkeratotic nor porokeratotic lesions present on today's visit.   Musculoskeletal:  Wearing AFO on left lower extremity. Dropfoot deformity secondary to CVA LLE. Hemiplegia LLE. Equinus LLE. No pain, crepitus or joint limitation noted with ROM bilateral LE. Utilizes motorized chair for  mobility assistance.  Left AFO with padding noted, but padding is easily malleable and deforms under pressure.  Assessment/Plan: 1. Pain due to onychomycosis of toenails of both feet   2. Diabetic peripheral neuropathy associated with type 2 diabetes mellitus (HCC)    -Caregiver/provider present with patient on today's visit. -Toenails 1-5 b/l were debrided in length and girth with sterile nail nippers without iatrogenic bleeding.  -Patient to be scheduled with [facility] for padding of AFO LLE. -Patient/POA to call should there be question/concern in the interim.   Return in about 3 months (around 05/15/2023).  Freddie Breech, DPM

## 2023-02-18 DIAGNOSIS — Z794 Long term (current) use of insulin: Secondary | ICD-10-CM | POA: Diagnosis not present

## 2023-02-18 DIAGNOSIS — E782 Mixed hyperlipidemia: Secondary | ICD-10-CM | POA: Diagnosis not present

## 2023-02-18 DIAGNOSIS — E1142 Type 2 diabetes mellitus with diabetic polyneuropathy: Secondary | ICD-10-CM | POA: Diagnosis not present

## 2023-02-18 DIAGNOSIS — I10 Essential (primary) hypertension: Secondary | ICD-10-CM | POA: Diagnosis not present

## 2023-02-18 DIAGNOSIS — E1165 Type 2 diabetes mellitus with hyperglycemia: Secondary | ICD-10-CM | POA: Diagnosis not present

## 2023-02-25 ENCOUNTER — Encounter: Payer: Self-pay | Admitting: Registered"

## 2023-02-25 ENCOUNTER — Encounter: Payer: 59 | Attending: Internal Medicine | Admitting: Registered"

## 2023-02-25 DIAGNOSIS — Z794 Long term (current) use of insulin: Secondary | ICD-10-CM | POA: Diagnosis not present

## 2023-02-25 DIAGNOSIS — E1149 Type 2 diabetes mellitus with other diabetic neurological complication: Secondary | ICD-10-CM | POA: Diagnosis not present

## 2023-02-25 NOTE — Patient Instructions (Addendum)
Muffins and M&Ms Only 1/2 package of M&M's and watch what your blood sugar does for the next couple of hours. Try 1/2 muffin instead of whole and add protein to it.  Instead of raisin bran try bran flakes and add 1/2 small banana for sweetness and include protein.  Aim to eat balanced meals and snacks  Continue eating a variety of nuts.  Continue drinking mostly water, think about switching up your sweet tea.

## 2023-02-25 NOTE — Progress Notes (Signed)
Diabetes Self-Management Education  Visit Type: First/Initial  Appt. Start Time: 1525 Appt. End Time: 1630  02/25/2023  Kimberly [name], identified by name and date of birth, is a 62 y.o. female with a diagnosis of Diabetes: Type 2.    This patient is accompanied in the office by her spouse and CNA .  ASSESSMENT  Last menstrual period 06/15/2013. There is no height or weight on file to calculate BMI.  A1c 8.9% Outside referral lab  DM medications: Tresiba 55 u daily (Pt states Dr. Katrinka Blazing lowered dose at last visit), spouse injects insulin for patient. Marcelline Deist dapagliflozin 10 mg Glimepiride (Pt states Dr. Kathaleen Bury added back to her medication list last visit)  Other relevant medications and supplements: Warfarin MWF 1 tablet and other days takes 1.5 tablet Omega-3 1000 mg - Pt reports she takes to help absorb CoQ10 Vit D 1000 units Cinnamon 500 units  Pt reports foot exam last week at [facility] Pt reports followed by endocrinologist Dr. Katrinka Blazing in [facility]  Pt uses Dexcom. Pt states hypo alarm woke her up ~2 months ago with reading of 40 mg/dL; daughter gave orange juice.  Pt states she loves banana nut muffins and peanut M&M's. Pt states she drinks about 1 8-oz glass of sweet tea per day.  Pt is is wheelchair bound and not able to move L arm. DKA noted 2021.   Diabetes Self-Management Education - 02/25/23 1500       Visit Information   Visit Type First/Initial      Initial Visit   Diabetes Type Type 2    Date Diagnosed 2011    Are you currently following a meal plan? No    Are you taking your medications as prescribed? Yes      Psychosocial Assessment   Patient Belief/Attitude about Diabetes Other (comment)   sucks     Complications   Last HgB A1C per patient/outside source 8.6 %    Have you had a dilated eye exam in the past 12 months? Yes    Have you had a dental exam in the past 12 months? Yes    Are you checking your feet? No       Dietary Intake   Breakfast egg, bacon, whole wheat bread, water    Snack (morning) almonds    Lunch 1/2 tuna sandwich, carrots, chips, water    Snack (afternoon) none    Dinner pork tenderloin,1/2 c mashed potato, green beans, water, sweet tea    Snack (evening) none    Beverage(s) water, 8 oz sweet tea      Activity / Exercise   Activity / Exercise Type ADL's   limited d/t wheelchair bound     Patient Education   Previous Diabetes Education No    Disease Pathophysiology Definition of diabetes, type 1 and 2, and the diagnosis of diabetes    Healthy Eating Plate Method    Being Active Other (comment)   exercise with arms   Medications Taught/reviewed insulin/injectables, injection, site rotation, insulin/injectables storage and needle disposal.;Reviewed patients medication for diabetes, action, purpose, timing of dose and side effects.    Monitoring Taught/evaluated CGM (comment)    Acute complications Taught prevention, symptoms, and  treatment of hypoglycemia - the 15 rule.      Individualized Goals (developed by patient)   Nutrition Other (comment)    Medications take my medication as prescribed    Monitoring  Consistenly use CGM      Outcomes  Expected Outcomes Demonstrated interest in learning. Expect positive outcomes    Future DMSE PRN    Program Status Not Completed            Individualized Plan for Diabetes Self-Management Training:   Learning Objective:  Patient will have a greater understanding of diabetes self-management. Patient education plan is to attend individual and/or group sessions per assessed needs and concerns.  Patient Instructions  Muffins and M&Ms Only 1/2 package of M&M's and watch what your blood sugar does for the next couple of hours. Try 1/2 muffin instead of whole and add protein to it.  Instead of raisin bran try bran flakes and add 1/2 small banana for sweetness and include protein.  Aim to eat balanced meals and snacks  Continue  eating a variety of nuts.  Continue drinking mostly water, think about switching up your sweet tea.  Expected Outcomes:  Demonstrated interest in learning. Expect positive outcomes  Education material provided: Vitamin K and Mediations, Living with Diabetes booklet.  If problems or questions, patient to contact team via:  Phone  Future DSME appointment: PRN

## 2023-03-27 DIAGNOSIS — I10 Essential (primary) hypertension: Secondary | ICD-10-CM | POA: Diagnosis not present

## 2023-03-27 DIAGNOSIS — E1142 Type 2 diabetes mellitus with diabetic polyneuropathy: Secondary | ICD-10-CM | POA: Diagnosis not present

## 2023-03-27 DIAGNOSIS — E782 Mixed hyperlipidemia: Secondary | ICD-10-CM | POA: Diagnosis not present

## 2023-03-27 DIAGNOSIS — E1165 Type 2 diabetes mellitus with hyperglycemia: Secondary | ICD-10-CM | POA: Diagnosis not present

## 2023-03-27 DIAGNOSIS — Z794 Long term (current) use of insulin: Secondary | ICD-10-CM | POA: Diagnosis not present

## 2023-04-02 DIAGNOSIS — Z7901 Long term (current) use of anticoagulants: Secondary | ICD-10-CM | POA: Diagnosis not present

## 2023-04-02 DIAGNOSIS — Z5181 Encounter for therapeutic drug level monitoring: Secondary | ICD-10-CM | POA: Diagnosis not present

## 2023-04-02 DIAGNOSIS — Z8673 Personal history of transient ischemic attack (TIA), and cerebral infarction without residual deficits: Secondary | ICD-10-CM | POA: Diagnosis not present

## 2023-04-02 DIAGNOSIS — R791 Abnormal coagulation profile: Secondary | ICD-10-CM | POA: Diagnosis not present

## 2023-04-09 DIAGNOSIS — Z5181 Encounter for therapeutic drug level monitoring: Secondary | ICD-10-CM | POA: Diagnosis not present

## 2023-04-09 DIAGNOSIS — Z7901 Long term (current) use of anticoagulants: Secondary | ICD-10-CM | POA: Diagnosis not present

## 2023-04-09 DIAGNOSIS — Z8673 Personal history of transient ischemic attack (TIA), and cerebral infarction without residual deficits: Secondary | ICD-10-CM | POA: Diagnosis not present

## 2023-04-15 DIAGNOSIS — J40 Bronchitis, not specified as acute or chronic: Secondary | ICD-10-CM | POA: Diagnosis not present

## 2023-04-15 DIAGNOSIS — R051 Acute cough: Secondary | ICD-10-CM | POA: Diagnosis not present

## 2023-04-15 DIAGNOSIS — E1165 Type 2 diabetes mellitus with hyperglycemia: Secondary | ICD-10-CM | POA: Diagnosis not present

## 2023-04-15 DIAGNOSIS — R771 Abnormality of globulin: Secondary | ICD-10-CM | POA: Diagnosis not present

## 2023-04-15 DIAGNOSIS — Z794 Long term (current) use of insulin: Secondary | ICD-10-CM | POA: Diagnosis not present

## 2023-04-15 DIAGNOSIS — E782 Mixed hyperlipidemia: Secondary | ICD-10-CM | POA: Diagnosis not present

## 2023-04-15 DIAGNOSIS — E1142 Type 2 diabetes mellitus with diabetic polyneuropathy: Secondary | ICD-10-CM | POA: Diagnosis not present

## 2023-04-15 DIAGNOSIS — I10 Essential (primary) hypertension: Secondary | ICD-10-CM | POA: Diagnosis not present

## 2023-04-19 DIAGNOSIS — Z5181 Encounter for therapeutic drug level monitoring: Secondary | ICD-10-CM | POA: Diagnosis not present

## 2023-04-19 DIAGNOSIS — Z7901 Long term (current) use of anticoagulants: Secondary | ICD-10-CM | POA: Diagnosis not present

## 2023-04-19 DIAGNOSIS — Z8673 Personal history of transient ischemic attack (TIA), and cerebral infarction without residual deficits: Secondary | ICD-10-CM | POA: Diagnosis not present

## 2023-04-22 DIAGNOSIS — E1165 Type 2 diabetes mellitus with hyperglycemia: Secondary | ICD-10-CM | POA: Diagnosis not present

## 2023-04-22 DIAGNOSIS — E782 Mixed hyperlipidemia: Secondary | ICD-10-CM | POA: Diagnosis not present

## 2023-04-22 DIAGNOSIS — E1142 Type 2 diabetes mellitus with diabetic polyneuropathy: Secondary | ICD-10-CM | POA: Diagnosis not present

## 2023-04-22 DIAGNOSIS — I10 Essential (primary) hypertension: Secondary | ICD-10-CM | POA: Diagnosis not present

## 2023-04-22 DIAGNOSIS — Z794 Long term (current) use of insulin: Secondary | ICD-10-CM | POA: Diagnosis not present

## 2023-04-22 DIAGNOSIS — R771 Abnormality of globulin: Secondary | ICD-10-CM | POA: Diagnosis not present

## 2023-05-08 DIAGNOSIS — Z8673 Personal history of transient ischemic attack (TIA), and cerebral infarction without residual deficits: Secondary | ICD-10-CM | POA: Diagnosis not present

## 2023-05-08 DIAGNOSIS — E1165 Type 2 diabetes mellitus with hyperglycemia: Secondary | ICD-10-CM | POA: Diagnosis not present

## 2023-05-08 DIAGNOSIS — Z7984 Long term (current) use of oral hypoglycemic drugs: Secondary | ICD-10-CM | POA: Diagnosis not present

## 2023-05-08 DIAGNOSIS — Z794 Long term (current) use of insulin: Secondary | ICD-10-CM | POA: Diagnosis not present

## 2023-05-08 DIAGNOSIS — Z978 Presence of other specified devices: Secondary | ICD-10-CM | POA: Diagnosis not present

## 2023-05-15 DIAGNOSIS — H401112 Primary open-angle glaucoma, right eye, moderate stage: Secondary | ICD-10-CM | POA: Diagnosis not present

## 2023-05-15 DIAGNOSIS — H401121 Primary open-angle glaucoma, left eye, mild stage: Secondary | ICD-10-CM | POA: Diagnosis not present

## 2023-05-15 DIAGNOSIS — E113312 Type 2 diabetes mellitus with moderate nonproliferative diabetic retinopathy with macular edema, left eye: Secondary | ICD-10-CM | POA: Diagnosis not present

## 2023-05-29 ENCOUNTER — Encounter: Payer: Self-pay | Admitting: Podiatry

## 2023-05-29 ENCOUNTER — Ambulatory Visit (INDEPENDENT_AMBULATORY_CARE_PROVIDER_SITE_OTHER): Payer: 59 | Admitting: Podiatry

## 2023-05-29 DIAGNOSIS — M79675 Pain in left toe(s): Secondary | ICD-10-CM

## 2023-05-29 DIAGNOSIS — E1142 Type 2 diabetes mellitus with diabetic polyneuropathy: Secondary | ICD-10-CM

## 2023-05-29 DIAGNOSIS — M79674 Pain in right toe(s): Secondary | ICD-10-CM | POA: Diagnosis not present

## 2023-05-29 DIAGNOSIS — B351 Tinea unguium: Secondary | ICD-10-CM | POA: Diagnosis not present

## 2023-06-02 NOTE — Progress Notes (Signed)
  Subjective:  Patient ID: Kimberly [name], female    DOB: [birth year],  MRN: [id number]  Kimberly [name] presents to clinic today for at risk foot care with history of diabetic neuropathy and painful elongated mycotic toenails 1-5 bilaterally which are tender when wearing enclosed shoe gear. Pain is relieved with periodic professional debridement.  Chief Complaint  Patient presents with   Diabetes    [facility] BS-203 A1C-8.6 PVP-04/2023   New problem(s): None.   PCP is Osei-Bonsu, Greggory Stallion, MD.  No Known Allergies  Review of Systems: Negative except as noted in the HPI.  Objective: No changes noted in today's physical examination. There were no vitals filed for this visit. Kimberly [name] is a pleasant 62 y.o. female obese in NAD. AAO x 3.  Vascular Examination: Capillary refill time <3 seconds b/l.  Vascular status intact b/l with palpable DP pulses; nonpalpable PT pulses b/l. Pedal hair absent b/l. No pain with calf compression b/l. Skin temperature gradient WNL b/l. No cyanosis or clubbing b/l. No ischemia or gangrene noted b/l. Dependent edema noted b/l LE.  Neurological Examination: Sensation grossly intact b/l with 10 gram monofilament. Vibratory sensation intact b/l. Pt has subjective symptoms of neuropathy.  Dermatological Examination: Pedal skin with normal turgor, texture and tone b/l.  No open wounds. No interdigital macerations.   Toenails 1-5 b/l thick, discolored, elongated with subungual debris and pain on dorsal palpation.   No corns, calluses nor porokeratotic lesions noted.  Musculoskeletal Examination: Dropfoot left lower extremity. Equinus LLE. Utilizes motorized chair for mobility assistance.  Radiographs: None  Assessment/Plan: 1. Pain due to onychomycosis of toenails of both feet   2. Diabetic peripheral neuropathy associated with type 2 diabetes mellitus (HCC)     -Consent given for treatment as described below: -Examined patient. -Diabetic foot examination  performed today. -Patient to continue soft, supportive shoe gear daily. -Mycotic toenails 1-5 bilaterally were debrided in length and girth with sterile nail nippers and dremel without incident. -Patient/POA to call should there be question/concern in the interim.   Return in about 3 months (around 08/29/2023).  Freddie Breech, DPM

## 2023-06-04 DIAGNOSIS — Z7901 Long term (current) use of anticoagulants: Secondary | ICD-10-CM | POA: Diagnosis not present

## 2023-06-04 DIAGNOSIS — Z5181 Encounter for therapeutic drug level monitoring: Secondary | ICD-10-CM | POA: Diagnosis not present

## 2023-06-04 DIAGNOSIS — Z8673 Personal history of transient ischemic attack (TIA), and cerebral infarction without residual deficits: Secondary | ICD-10-CM | POA: Diagnosis not present

## 2023-06-06 DIAGNOSIS — I6990 Unspecified sequelae of unspecified cerebrovascular disease: Secondary | ICD-10-CM | POA: Diagnosis not present

## 2023-06-07 DIAGNOSIS — I10 Essential (primary) hypertension: Secondary | ICD-10-CM | POA: Diagnosis not present

## 2023-06-07 DIAGNOSIS — Z794 Long term (current) use of insulin: Secondary | ICD-10-CM | POA: Diagnosis not present

## 2023-06-07 DIAGNOSIS — E1165 Type 2 diabetes mellitus with hyperglycemia: Secondary | ICD-10-CM | POA: Diagnosis not present

## 2023-06-07 DIAGNOSIS — E782 Mixed hyperlipidemia: Secondary | ICD-10-CM | POA: Diagnosis not present

## 2023-06-07 DIAGNOSIS — Z0001 Encounter for general adult medical examination with abnormal findings: Secondary | ICD-10-CM | POA: Diagnosis not present

## 2023-06-07 DIAGNOSIS — E1142 Type 2 diabetes mellitus with diabetic polyneuropathy: Secondary | ICD-10-CM | POA: Diagnosis not present

## 2023-06-10 ENCOUNTER — Other Ambulatory Visit: Payer: 59

## 2023-06-13 DIAGNOSIS — S300XXA Contusion of lower back and pelvis, initial encounter: Secondary | ICD-10-CM | POA: Diagnosis not present

## 2023-06-13 DIAGNOSIS — M5431 Sciatica, right side: Secondary | ICD-10-CM | POA: Diagnosis not present

## 2023-06-13 DIAGNOSIS — Z9181 History of falling: Secondary | ICD-10-CM | POA: Diagnosis not present

## 2023-06-27 DIAGNOSIS — E1142 Type 2 diabetes mellitus with diabetic polyneuropathy: Secondary | ICD-10-CM | POA: Diagnosis not present

## 2023-06-27 DIAGNOSIS — E1165 Type 2 diabetes mellitus with hyperglycemia: Secondary | ICD-10-CM | POA: Diagnosis not present

## 2023-06-27 DIAGNOSIS — E782 Mixed hyperlipidemia: Secondary | ICD-10-CM | POA: Diagnosis not present

## 2023-06-27 DIAGNOSIS — Z794 Long term (current) use of insulin: Secondary | ICD-10-CM | POA: Diagnosis not present

## 2023-06-27 DIAGNOSIS — I10 Essential (primary) hypertension: Secondary | ICD-10-CM | POA: Diagnosis not present

## 2023-07-10 DIAGNOSIS — E113312 Type 2 diabetes mellitus with moderate nonproliferative diabetic retinopathy with macular edema, left eye: Secondary | ICD-10-CM | POA: Diagnosis not present

## 2023-07-10 DIAGNOSIS — H401112 Primary open-angle glaucoma, right eye, moderate stage: Secondary | ICD-10-CM | POA: Diagnosis not present

## 2023-07-10 DIAGNOSIS — H401121 Primary open-angle glaucoma, left eye, mild stage: Secondary | ICD-10-CM | POA: Diagnosis not present

## 2023-08-02 DIAGNOSIS — Z5181 Encounter for therapeutic drug level monitoring: Secondary | ICD-10-CM | POA: Diagnosis not present

## 2023-08-02 DIAGNOSIS — Z8673 Personal history of transient ischemic attack (TIA), and cerebral infarction without residual deficits: Secondary | ICD-10-CM | POA: Diagnosis not present

## 2023-08-02 DIAGNOSIS — Z7901 Long term (current) use of anticoagulants: Secondary | ICD-10-CM | POA: Diagnosis not present

## 2023-08-08 DIAGNOSIS — E113312 Type 2 diabetes mellitus with moderate nonproliferative diabetic retinopathy with macular edema, left eye: Secondary | ICD-10-CM | POA: Diagnosis not present

## 2023-08-08 DIAGNOSIS — H401112 Primary open-angle glaucoma, right eye, moderate stage: Secondary | ICD-10-CM | POA: Diagnosis not present

## 2023-08-08 DIAGNOSIS — H401121 Primary open-angle glaucoma, left eye, mild stage: Secondary | ICD-10-CM | POA: Diagnosis not present

## 2023-09-11 ENCOUNTER — Ambulatory Visit: Payer: 59

## 2023-09-11 ENCOUNTER — Encounter: Payer: Self-pay | Admitting: Podiatry

## 2023-09-11 ENCOUNTER — Ambulatory Visit (INDEPENDENT_AMBULATORY_CARE_PROVIDER_SITE_OTHER): Payer: 59 | Admitting: Podiatry

## 2023-09-11 VITALS — Ht 63.0 in | Wt 249.0 lb

## 2023-09-11 DIAGNOSIS — M21372 Foot drop, left foot: Secondary | ICD-10-CM | POA: Diagnosis not present

## 2023-09-11 DIAGNOSIS — B351 Tinea unguium: Secondary | ICD-10-CM

## 2023-09-11 DIAGNOSIS — M79674 Pain in right toe(s): Secondary | ICD-10-CM

## 2023-09-11 DIAGNOSIS — E1142 Type 2 diabetes mellitus with diabetic polyneuropathy: Secondary | ICD-10-CM | POA: Diagnosis not present

## 2023-09-11 DIAGNOSIS — M79675 Pain in left toe(s): Secondary | ICD-10-CM | POA: Diagnosis not present

## 2023-09-11 DIAGNOSIS — Z8673 Personal history of transient ischemic attack (TIA), and cerebral infarction without residual deficits: Secondary | ICD-10-CM

## 2023-09-11 DIAGNOSIS — I69354 Hemiplegia and hemiparesis following cerebral infarction affecting left non-dominant side: Secondary | ICD-10-CM

## 2023-09-11 NOTE — Progress Notes (Signed)
Patient is afraid she will get billed 20% for AFO as her 2ndary is [facility]  I will get referral sent to [facility].

## 2023-09-13 NOTE — Progress Notes (Signed)
  Subjective:  Patient ID: Kimberly [name], female    DOB: 09-08-1960,  MRN: [id number]  63 y.o. female presents to clinic with  at risk foot care with history of diabetic neuropathy and painful thick toenails that are difficult to trim. Pain interferes with ambulation. Aggravating factors include wearing enclosed shoe gear. Pain is relieved with periodic professional debridement.   Patient is in need of new AFO for LLE for spastic hemiplegia with dropfoot. Chief Complaint  Patient presents with   RFC    New problem(s): None   PCP is Osei-Bonsu, Greggory Stallion, MD.  No Known Allergies  Review of Systems: Negative except as noted in the HPI.   Objective:  Kimberly [name] is a pleasant 63 y.o. female obese in NAD. AAO x 3.  Vascular Examination: Capillary refill time <3 seconds b/l.  Vascular status intact b/l with palpable DP pulses; nonpalpable PT pulses b/l. Pedal hair absent b/l. No pain with calf compression b/l. Skin temperature gradient WNL b/l. No cyanosis or clubbing b/l. No ischemia or gangrene noted b/l. Dependent edema noted b/l LE.  Neurological Examination: Sensation grossly intact b/l with 10 gram monofilament. Vibratory sensation intact b/l. Pt has subjective symptoms of neuropathy.  Dermatological Examination: Pedal skin with normal turgor, texture and tone b/l.  No open wounds. No interdigital macerations.   Toenails 1-5 b/l thick, discolored, elongated with subungual debris and pain on dorsal palpation.   No corns, calluses nor porokeratotic lesions noted.  Musculoskeletal Examination: Dropfoot left lower extremity. Equinus LLE. Utilizes motorized chair for mobility assistance.  Radiographs: None  Last A1c:       No data to display           Assessment:   1. Pain due to onychomycosis of toenails of both feet   2. Left foot drop   3. Spastic hemiplegia of left nondominant side as late effect of cerebral infarction (HCC)   4. Status post CVA   5. Diabetic  peripheral neuropathy associated with type 2 diabetes mellitus ([facility])    Plan:   Orders Placed This Encounter  Procedures   For home use only DME Other see comment    Dispense one ankle foot orthosis for LLE. Diagnosis Dropfoot LLE, s/p CVA.   To: [facility] [address] [place], [place] [address]  [phone]    Length of Need:   [facility]  -Order written fro new AFO for LLE. To be dispensed by [facility], [address], [place], [place], [address]. -Patient to continue soft, supportive shoe gear daily. -Toenails 1-5 b/l were debrided in length and girth with sterile nail nippers and dremel without iatrogenic bleeding.  -Patient/POA to call should there be question/concern in the interim.  Return in about 3 months (around 12/10/2023).  Freddie Breech, DPM      [place] LOCATION: [address] [place] [address]                   Office [phone]   [facility] LOCATION: [address] [place], [place] [address] Office [phone]

## 2023-09-16 ENCOUNTER — Telehealth: Payer: Self-pay | Admitting: Podiatry

## 2023-09-16 NOTE — Telephone Encounter (Signed)
Pt called and stated she called her insurance and they told her she was fully covered and I explained that yes she is fully covered but we are not able to bill her secondary insurance( medicaid for dme thru our office.) if she goes to [facility] they are able to bill the [facility] and medicaid thru them and it will be fully covered.

## 2023-09-23 ENCOUNTER — Telehealth: Payer: Self-pay

## 2023-09-23 NOTE — Telephone Encounter (Signed)
Order and Demo sent to [facility] for AFO / Dr. Gloriajean Dell Cped, CFo, CFm

## 2023-09-25 DIAGNOSIS — Z5181 Encounter for therapeutic drug level monitoring: Secondary | ICD-10-CM | POA: Diagnosis not present

## 2023-09-25 DIAGNOSIS — Z8673 Personal history of transient ischemic attack (TIA), and cerebral infarction without residual deficits: Secondary | ICD-10-CM | POA: Diagnosis not present

## 2023-09-25 DIAGNOSIS — Z7901 Long term (current) use of anticoagulants: Secondary | ICD-10-CM | POA: Diagnosis not present

## 2023-10-03 DIAGNOSIS — E119 Type 2 diabetes mellitus without complications: Secondary | ICD-10-CM | POA: Diagnosis not present

## 2023-10-03 DIAGNOSIS — Z794 Long term (current) use of insulin: Secondary | ICD-10-CM | POA: Diagnosis not present

## 2023-10-03 DIAGNOSIS — Z7984 Long term (current) use of oral hypoglycemic drugs: Secondary | ICD-10-CM | POA: Diagnosis not present

## 2023-10-03 DIAGNOSIS — Z978 Presence of other specified devices: Secondary | ICD-10-CM | POA: Diagnosis not present

## 2023-10-06 ENCOUNTER — Encounter (HOSPITAL_BASED_OUTPATIENT_CLINIC_OR_DEPARTMENT_OTHER): Payer: Self-pay | Admitting: Emergency Medicine

## 2023-10-06 ENCOUNTER — Other Ambulatory Visit: Payer: Self-pay

## 2023-10-06 ENCOUNTER — Emergency Department (HOSPITAL_BASED_OUTPATIENT_CLINIC_OR_DEPARTMENT_OTHER)
Admission: EM | Admit: 2023-10-06 | Discharge: 2023-10-06 | Disposition: A | Discharge status: Home / Self Care | Payer: 59 | Attending: Emergency Medicine | Admitting: Emergency Medicine

## 2023-10-06 DIAGNOSIS — K644 Residual hemorrhoidal skin tags: Secondary | ICD-10-CM | POA: Insufficient documentation

## 2023-10-06 DIAGNOSIS — Z794 Long term (current) use of insulin: Secondary | ICD-10-CM | POA: Insufficient documentation

## 2023-10-06 DIAGNOSIS — E119 Type 2 diabetes mellitus without complications: Secondary | ICD-10-CM | POA: Diagnosis not present

## 2023-10-06 DIAGNOSIS — I10 Essential (primary) hypertension: Secondary | ICD-10-CM | POA: Insufficient documentation

## 2023-10-06 DIAGNOSIS — Z7901 Long term (current) use of anticoagulants: Secondary | ICD-10-CM | POA: Insufficient documentation

## 2023-10-06 DIAGNOSIS — K6289 Other specified diseases of anus and rectum: Secondary | ICD-10-CM | POA: Diagnosis present

## 2023-10-06 MED ORDER — LIDOCAINE-HYDROCORTISONE ACE 2.8-0.55 % RE GEL: 1.0000 | Freq: Two times a day (BID) | RECTAL | 0 refills | Status: DC | PRN

## 2023-10-06 NOTE — ED Provider Notes (Signed)
  EMERGENCY DEPARTMENT AT [facility] Provider Note   CSN: [id number] Arrival date & time: 10/06/23  1336     History  Chief Complaint  Patient presents with   Hemorrhoids    Kimberly [name] is a 63 y.o. female with history of prior stroke, diabetes, hypertension, seizures, who presents the emergency department complaining of rectal pain and hemorrhoid.  Patient states that she has had a hemorrhoid in the past, about a year and a half ago.  She was having diarrhea last week and had a stomach bug.  After the diarrhea stopped she did have an episode or 2 with some light pink bleeding per rectum.  This has resolved.  She has had more rectal pain for the past 3 days or so.  She tried putting Preparation H on the area.  She was worried because she is on Coumadin and was worried about ongoing bleeding. No bright red or dark stools.   HPI     Home Medications Prior to Admission medications   Medication Sig Start Date End Date Taking? Authorizing Provider  Lidocaine -Hydrocortisone  Ace 2.8-0.55 % GEL Place 1 Dose rectally 2 (two) times daily as needed. 10/06/23  Yes Kynisha Memon T, PA-C  ACCU-CHEK GUIDE test strip CHECK BLOOD SUGARS TWICE A DAY 04/05/21   Kassie Mallick, MD  Accu-Chek Softclix Lancets lancets Use to monitor glucose levels once daily; E11.9 01/03/22   Shamleffer, Ibtehal Jaralla, MD  ascorbic acid (VITAMIN C) 100 MG tablet Take 1 tablet by mouth daily.    [provider]  Blood Glucose Monitoring Suppl (ACCU-CHEK GUIDE ME) w/Device KIT Use as instructed to check blood sugar 2/daily 01/03/22   Shamleffer, Ibtehal Jaralla, MD  butalbital-acetaminophen -caffeine (FIORICET, ESGIC) 50-325-40 MG per tablet Take 1 tablet by mouth 2 (two) times daily as needed for headache.    [provider]  cholecalciferol (VITAMIN D) 1000 UNITS tablet Take 1,000 Units by mouth daily.    [provider]  Cholecalciferol 25 MCG (1000 UT) capsule Take by mouth.     [provider]  Cinnamon 500 MG capsule Take by mouth.    [provider]  Coenzyme Q10 50 MG CAPS Take 1 capsule by mouth daily.    [provider]  enoxaparin (LOVENOX) 150 MG/ML injection Take 150 mg subcutaneously every 24 hours except 1/2 dosage on 01/04/21 per bridge plan 12/28/20   [provider]  FARXIGA  10 MG TABS tablet TAKE 1 TABLET BY MOUTH EVERY DAY 12/12/21   Kassie Mallick, MD  glimepiride  (AMARYL ) 4 MG tablet Take 4 mg by mouth daily with breakfast.    [provider]  hydrocortisone  cream 0.5 % Apply topically as needed.    [provider]  ibuprofen (ADVIL) 800 MG tablet Take 800 mg by mouth every 8 (eight) hours as needed. 02/25/20   [provider]  insulin  degludec (TRESIBA  FLEXTOUCH) 200 UNIT/ML FlexTouch Pen Inject 66 Units into the skin daily. Patient taking differently: Inject 55 Units into the skin daily. 03/15/21   Kassie Mallick, MD  Insulin  Pen Needle (B-D UF III MINI PEN NEEDLES) 31G X 5 MM MISC 1 each by Other route daily. E11.9 09/06/21   Kassie Mallick, MD  lacosamide (VIMPAT) 200 MG TABS tablet SMARTSIG:Tablet(s) By Mouth 11/29/20   [provider]  Lancets Misc. (ACCU-CHEK SOFTCLIX LANCET DEV) KIT See admin instructions. 03/16/21   [provider]  latanoprost (XALATAN) 0.005 % ophthalmic solution INSTILL 1 DROP(S) IN EACH EYE DAILY IN THE  EVENING 07/16/18   [provider]  latanoprost (XALATAN) 0.005 % ophthalmic solution Apply to eye. 07/25/20   [provider]  neomycin -polymyxin-hydrocortisone  (CORTISPORIN) OTIC solution Apply 1 to 2 drops to toe BID after soaking 05/11/20   Regal, [place] RAMAN, DPM  NON FORMULARY once a week. Ultimate eye support supplement --taking once a week    [provider]  Omega-3 1000 MG CAPS Take by mouth.    [provider]  warfarin (COUMADIN) 5 MG tablet Take by mouth. 05/22/21   [provider]  zonisamide (ZONEGRAN)  100 MG capsule Take 300 mg by mouth. 02/12/14   [provider]      Allergies    Patient has no known allergies.    Review of Systems   Review of Systems  Gastrointestinal:        Rectal pain  All other systems reviewed and are negative.   Physical Exam Updated Vital Signs BP 135/75 (BP Location: Right Arm)   Pulse 84   Temp 97.8 F (36.6 C)   Resp 20   Ht 5' 3 (1.6 m)   Wt 112.9 kg   LMP 06/15/2013   SpO2 98%   BMI 44.09 kg/m  Physical Exam Vitals and nursing note reviewed. Exam conducted with a chaperone present.  Constitutional:      Appearance: Normal appearance.  HENT:     Head: Normocephalic and atraumatic.  Eyes:     Conjunctiva/sclera: Conjunctivae normal.  Pulmonary:     Effort: Pulmonary effort is normal. No respiratory distress.  Genitourinary:    Rectum: External hemorrhoid present.     Comments: No melanotic stool on exam, tender external hemorrhoid, not thrombosed Skin:    General: Skin is warm and dry.  Neurological:     Mental Status: She is alert.  Psychiatric:        Mood and Affect: Mood normal.        Behavior: Behavior normal.     ED Results / Procedures / Treatments   Labs (all labs ordered are listed, but only abnormal results are displayed) Labs Reviewed - No data to display  EKG None  Radiology No results found.  Procedures Procedures    Medications Ordered in ED Medications - No data to display  ED Course/ Medical Decision Making/ A&P                                 Medical Decision Making Risk Prescription drug management.   This patient is a 63 y.o. female who presents to the ED for concern of rectal pain.   Differential diagnoses prior to evaluation: Hemorrhoid, anal fissure, anal/rectal irritation, GI bleed  Past Medical History / Social History / Additional history: Chart reviewed. Pertinent results include: prior stroke, diabetes, hypertension, seizures  Physical Exam: Physical exam  performed. The pertinent findings include: Normal vitals, no acute distress.  GU exam performed chaperone.  No melanotic stool on exam, patient does have tender external hemorrhoid, not thrombosed.  Disposition: After consideration of the diagnostic results and the patients response to treatment, I feel that emergency department workup does not suggest an emergent condition requiring admission or immediate intervention beyond what has been performed at this time. The plan is: Discharged home.  Will prescribe hydrocortisone  lidocaine  gel.  Will recommend sitz bath and follow-up with gastroenterology for possible banding.. The patient is safe for discharge and has been instructed to return immediately for  worsening symptoms, change in symptoms or any other concerns.  Final Clinical Impression(s) / ED Diagnoses Final diagnoses:  External hemorrhoid    Rx / DC Orders ED Discharge Orders          Ordered    Lidocaine -Hydrocortisone  Ace 2.8-0.55 % GEL  2 times daily PRN        10/06/23 1744           Portions of this report may have been transcribed using voice recognition software. Every effort was made to ensure accuracy; however, inadvertent computerized transcription errors may be present.    Chellsie Gomer T, PA-C 10/06/23 1750    Yolande Lamar BROCKS, MD 10/07/23 [phone]

## 2023-10-06 NOTE — Discharge Instructions (Addendum)
 You were seen in the emergency department today for rectal pain and hemorrhoids.  As we discussed you do have an external hemorrhoid.  I have prescribed a different medication that you can use on the area twice a day.  You can take Tylenol  as needed for pain as well.  I recommend doing what is called a sitz bath, which can be done in either bathtub or on the toilet.  You can buy a plastic cat to put on the toilet and fill it with warm water and soak the area.  Continue taking stool softeners as you have been to make sure your bowel movements are regular, and you are not having to cause increased strain on the area.  I also recommend following up with the gastroenterologist to talk about definitive management of this.  I have attached the contact information for you to make a follow-up appointment.  Continue to monitor how you are doing and return to ER for new or worsening symptoms such as increase in bleeding, or intolerable pain.

## 2023-10-06 NOTE — ED Triage Notes (Signed)
 Pt c/o bleeding, painful hemorrhoid

## 2023-10-29 DIAGNOSIS — Z5181 Encounter for therapeutic drug level monitoring: Secondary | ICD-10-CM | POA: Diagnosis not present

## 2023-10-29 DIAGNOSIS — Z7901 Long term (current) use of anticoagulants: Secondary | ICD-10-CM | POA: Diagnosis not present

## 2023-11-06 DIAGNOSIS — Z7901 Long term (current) use of anticoagulants: Secondary | ICD-10-CM | POA: Diagnosis not present

## 2023-11-06 DIAGNOSIS — Z5181 Encounter for therapeutic drug level monitoring: Secondary | ICD-10-CM | POA: Diagnosis not present

## 2023-11-06 DIAGNOSIS — Z8673 Personal history of transient ischemic attack (TIA), and cerebral infarction without residual deficits: Secondary | ICD-10-CM | POA: Diagnosis not present

## 2023-11-07 DIAGNOSIS — E782 Mixed hyperlipidemia: Secondary | ICD-10-CM | POA: Diagnosis not present

## 2023-11-07 DIAGNOSIS — Z0000 Encounter for general adult medical examination without abnormal findings: Secondary | ICD-10-CM | POA: Diagnosis not present

## 2023-11-07 DIAGNOSIS — Z794 Long term (current) use of insulin: Secondary | ICD-10-CM | POA: Diagnosis not present

## 2023-11-07 DIAGNOSIS — E1165 Type 2 diabetes mellitus with hyperglycemia: Secondary | ICD-10-CM | POA: Diagnosis not present

## 2023-11-07 DIAGNOSIS — E1142 Type 2 diabetes mellitus with diabetic polyneuropathy: Secondary | ICD-10-CM | POA: Diagnosis not present

## 2023-11-07 DIAGNOSIS — I10 Essential (primary) hypertension: Secondary | ICD-10-CM | POA: Diagnosis not present

## 2023-11-14 DIAGNOSIS — Z1211 Encounter for screening for malignant neoplasm of colon: Secondary | ICD-10-CM | POA: Diagnosis not present

## 2023-11-14 DIAGNOSIS — Z1212 Encounter for screening for malignant neoplasm of rectum: Secondary | ICD-10-CM | POA: Diagnosis not present

## 2023-11-28 DIAGNOSIS — Z8673 Personal history of transient ischemic attack (TIA), and cerebral infarction without residual deficits: Secondary | ICD-10-CM | POA: Diagnosis not present

## 2023-11-28 DIAGNOSIS — Z7901 Long term (current) use of anticoagulants: Secondary | ICD-10-CM | POA: Diagnosis not present

## 2023-11-28 DIAGNOSIS — Z5181 Encounter for therapeutic drug level monitoring: Secondary | ICD-10-CM | POA: Diagnosis not present

## 2024-01-01 ENCOUNTER — Encounter: Payer: Self-pay | Admitting: Podiatry

## 2024-01-01 ENCOUNTER — Ambulatory Visit (INDEPENDENT_AMBULATORY_CARE_PROVIDER_SITE_OTHER): Payer: 59 | Admitting: Podiatry

## 2024-01-01 VITALS — Ht 63.0 in | Wt 248.9 lb

## 2024-01-01 DIAGNOSIS — M79675 Pain in left toe(s): Secondary | ICD-10-CM | POA: Diagnosis not present

## 2024-01-01 DIAGNOSIS — E1142 Type 2 diabetes mellitus with diabetic polyneuropathy: Secondary | ICD-10-CM

## 2024-01-01 DIAGNOSIS — B351 Tinea unguium: Secondary | ICD-10-CM

## 2024-01-01 DIAGNOSIS — M79674 Pain in right toe(s): Secondary | ICD-10-CM

## 2024-01-05 NOTE — Progress Notes (Signed)
  Subjective:  Patient ID: Kimberly [name], female    DOB: [birth year],  MRN: [id number]  Kimberly [name] presents to clinic today for at risk foot care with history of diabetic neuropathy and painful, elongated thickened toenails x 10 which are symptomatic when wearing enclosed shoe gear. This interferes with his/her daily activities. She is awaiting delivery of her new AFO from [employer]. Chief Complaint  Patient presents with   Nail Problem    Pt is here for [facility] last A1C 8.3 PCP is Dr Kathlene Paradise and LOV was in March.   New problem(s): None.   PCP is Osei-Bonsu, Caretha Chapel, MD.  No Known Allergies  Review of Systems: Negative except as noted in the HPI.  Objective: No changes noted in today's physical examination. There were no vitals filed for this visit. Kimberly [name] is a pleasant 63 y.o. female obese in NAD. AAO x 3.  Vascular Examination: Capillary refill time <3 seconds b/l.  Vascular status intact b/l with palpable DP pulses; nonpalpable PT pulses b/l. Pedal hair absent b/l. No pain with calf compression b/l. Skin temperature gradient WNL b/l. No cyanosis or clubbing b/l. No ischemia or gangrene noted b/l. Dependent edema noted b/l LE.  Neurological Examination: Sensation grossly intact b/l with 10 gram monofilament. Vibratory sensation intact b/l. Pt has subjective symptoms of neuropathy.  Dermatological Examination: Pedal skin with normal turgor, texture and tone b/l.  No open wounds. No interdigital macerations.   Toenails 1-5 b/l thick, discolored, elongated with subungual debris and pain on dorsal palpation.   No corns, calluses nor porokeratotic lesions noted.  Musculoskeletal Examination: Dropfoot left lower extremity. Equinus LLE. Utilizes motorized chair for mobility assistance.  Radiographs: None  Assessment/Plan: 1. Pain due to onychomycosis of toenails of both feet   2. Diabetic peripheral neuropathy associated with type 2 diabetes mellitus (HCC)     -Caregiver/provider present with patient on today's visit. -Continue foot and shoe inspections daily. Monitor blood glucose per PCP/Endocrinologist's recommendations. -Continue diabetic shoes daily. -Mycotic toenails 1-5 bilaterally were debrided in length and girth with sterile nail nippers and dremel without incident. -Patient/POA to call should there be question/concern in the interim.   Return in about 3 months (around 04/02/2024).  Kimberly [name], DPM      [place] LOCATION: [address] [place] [address]                   Office [phone]   [facility] LOCATION: [address] [place], [place] [address] Office [phone]

## 2024-01-06 ENCOUNTER — Telehealth: Payer: Self-pay

## 2024-01-06 NOTE — Progress Notes (Signed)
   01/06/2024  Patient ID: Kimberly [name], female   DOB: 05-29-1961, 63 y.o.   MRN: [id number]  Contacted patient regarding referral for diabetes from [employer] [facility]: Diabetes   Appointment scheduled 01/07/24 at 3:00pm.  Livia Riffle, PharmD Clinical Pharmacist  [phone]

## 2024-01-07 DIAGNOSIS — R600 Localized edema: Secondary | ICD-10-CM | POA: Diagnosis not present

## 2024-01-07 DIAGNOSIS — M21372 Foot drop, left foot: Secondary | ICD-10-CM | POA: Diagnosis not present

## 2024-01-07 DIAGNOSIS — E1142 Type 2 diabetes mellitus with diabetic polyneuropathy: Secondary | ICD-10-CM | POA: Diagnosis not present

## 2024-01-07 NOTE — Progress Notes (Signed)
   01/07/2024  Patient ID: Kimberly [name], female   DOB: [birth year], 63 y.o.   MRN: [id number]  Contacted patient regarding referral for diabetes from [employer] [facility]: Diabetes. Appointment scheduled 01/07/24 at 3:00pm.  Patient was unavailable, requested rescheduling for Friday. New appointment scheduled 01/10/24 at 11:00am  Livia Riffle, PharmD Clinical Pharmacist  [phone]

## 2024-01-09 DIAGNOSIS — Z7901 Long term (current) use of anticoagulants: Secondary | ICD-10-CM | POA: Diagnosis not present

## 2024-01-09 DIAGNOSIS — Z5181 Encounter for therapeutic drug level monitoring: Secondary | ICD-10-CM | POA: Diagnosis not present

## 2024-01-10 ENCOUNTER — Other Ambulatory Visit: Payer: Self-pay

## 2024-01-10 NOTE — Progress Notes (Addendum)
 01/10/2024 Name: Kimberly [name] MRN: [id number] DOB: 1960-10-17  Chief Complaint  Patient presents with   Diabetes    Kimberly [name] is a 63 y.o. year old female who presented for a telephone visit.   They were referred to the pharmacist by a quality report for assistance in managing diabetes. Patient on [facility] Roster for an A1c > 8%.   Subjective: Patient reports managing diabetes well with her husband and endocrinologist. Her husband helps her with all of her medications and she has the Dexcom CGM. She does report not wanting to be on insulin  or any new medications.   Care Team: Primary Care Provider: Vernette [name], [place]; Next Scheduled Visit: 02/07/24 Endocrinologist Smith,William Lenita Quinones., MD; Next Scheduled Visit: 02/27/24  Medication Access/Adherence  Current Pharmacy:  [id number] - [place], [place] - [address] [place] [address] Phone: [phone] Fax: [phone]  [id number] - [place], [place] - [address] [place] [address] Phone: [phone] Fax: [phone]   Patient reports affordability concerns with their medications: No  Patient reports access/transportation concerns to their pharmacy: No  Patient reports adherence concerns with their medications:  No     Diabetes:  Current medications: Tresiba  55 units daily, Farxiga  10 mg daily **Glimepiride  4 mg - stopped by endo in 2024, patient reports still taking Medications tried in the past: Mounjaro, Rybelsus  & Trulicity (intolerant d/t GI side effects), Invokana   Average Glucose: 187 mg/dL Glucose Management Indicator: 7.8  Time in Goal:  - Time in range 70-180: 50% - Time above range: 50% - Time below range: 0%   Patient denies hypoglycemic s/sx including dizziness, shakiness, sweating. Patient denies hyperglycemic symptoms including polyuria, polydipsia, polyphagia, nocturia, neuropathy, blurred vision.  Current  meal patterns:  - Breakfast: greek yogurt, banana, apple cinnamon muffin  - Lunch typically lighter - sandwich (ham/tuna) on 1 piece of wheat bread, grapes - Supper salads on Fridays, mostly protein (baked chicken) and a vegetable (green beans), sometimes brown rice - Snacks peanut butter cookies (3-4) a few times a week, fruits  - Drinks mainly water ~90 oz daily  Current physical activity: walks daily, PT monitors, chair exercises daily   Current medication access support: pt has dual coverage   Objective:   Medications Reviewed Today     Reviewed by Livia Riffle, [facility] (Pharmacist) on 01/10/24 at 1235  Med List Status: <None>   Medication Order Taking? Sig Documenting Provider Last Dose Status Informant  ACCU-CHEK GUIDE test strip [id number] Yes CHECK BLOOD SUGARS TWICE A Evelynn Hirschfeld, MD Taking Active   Accu-Chek Softclix Lancets lancets [id number] Yes Use to monitor glucose levels once daily; E11.9 Shamleffer, Ibtehal Jaralla, MD Taking Active   albuterol (VENTOLIN HFA) 108 (90 Base) MCG/ACT inhaler [id number] No Inhale 2 puffs into the lungs every 6 (six) hours as needed.  Patient not taking: Reported on 01/10/2024   [provider] Not Taking Active   ascorbic acid (VITAMIN C) 100 MG tablet [id number] Yes Take 1 tablet by mouth daily. [provider] Taking Active   atorvastatin (LIPITOR) 80 MG tablet [id number] No Take 80 mg by mouth daily.  Patient not taking: Reported on 01/10/2024   [provider] Not Taking Active   BD PEN NEEDLE NANO 2ND GEN 32G X 4 MM MISC [id number] Yes Use with insulin  as directed [provider] Taking Active   Blood Glucose Monitoring Suppl (ACCU-CHEK GUIDE ME) w/Device KIT [id number] Yes Use as instructed  to check blood sugar 2/daily Shamleffer, Julian Obey, MD Taking Active   butalbital-acetaminophen -caffeine (FIORICET, ESGIC) 50-325-40 MG per tablet [id number] No Take 1 tablet by mouth 2 (two) times daily as  needed for headache.  Patient not taking: Reported on 01/10/2024   [provider] Not Taking Active   cholecalciferol (VITAMIN D) 1000 UNITS tablet [id number] Yes Take 1,000 Units by mouth daily. [provider] Taking Active Self  Cinnamon 500 MG capsule [id number] Yes Take 500 mg by mouth daily. [provider] Taking Active   Coenzyme Q10 50 MG CAPS [id number] Yes Take 1 capsule by mouth daily. [provider] Taking Active   Continuous Glucose Sensor (DEXCOM G7 SENSOR) MISC [id number] Yes SMARTSIG:1 Each Every 10 Days [provider] Taking Active   FARXIGA  10 MG TABS tablet [id number] Yes TAKE 1 TABLET BY MOUTH EVERY DAY Gwyndolyn Lerner, MD Taking Active   glimepiride  (AMARYL ) 4 MG tablet [id number] Yes Take 4 mg by mouth daily with breakfast. [provider] Taking Active   insulin  degludec (TRESIBA  FLEXTOUCH) 200 UNIT/ML FlexTouch Pen [id number] Yes Inject 66 Units into the skin daily.  Patient taking differently: Inject 55 Units into the skin daily.   Gwyndolyn Lerner, MD Taking Active   lacosamide (VIMPAT) 200 MG TABS tablet [id number] Yes Take 200 mg by mouth 2 (two) times daily. [provider] Taking Active   latanoprost (XALATAN) 0.005 % ophthalmic solution [id number] Yes INSTILL 1 DROP(S) IN EACH EYE DAILY IN THE EVENING [provider] Taking Active   metoprolol succinate (TOPROL-XL) 50 MG 24 hr tablet [id number] No 1 tab(s) orally once a day for 90 days  Patient not taking: Reported on 01/10/2024   [provider] Not Taking Active   Omega-3 1000 MG CAPS [id number] No Take by mouth.  Patient not taking: Reported on 01/10/2024   [provider] Not Taking Active   timolol (TIMOPTIC) 0.5 % ophthalmic solution [id number] Yes SMARTSIG:1 Drop(s) In Eye(s) Every Morning [provider] Taking Active   warfarin (COUMADIN) 5 MG tablet [id number] Yes TAKE 1.5 TABLETS BY MOUTH DAILY EXCEPT 1 TABLET ON MONDAYS AND  WEDNESDAYS AND FRIDAYS OR AS DIRECTED [provider] Taking Active   zonisamide (ZONEGRAN) 100 MG capsule [id number] Yes Take 5 capsules by mouth daily. [provider] Taking Active               Assessment/Plan:   Diabetes: - Currently trending in the right direction, GMI is down and TIR is up from last endo visit in Feb. Congratulated patient on progress  - Reviewed long term cardiovascular and renal outcomes of uncontrolled blood sugar - Reviewed goal A1c, goal fasting, and goal 2 hour post prandial glucose - Reviewed dietary modifications including reducing carb intake, cutting back on muffins with breakfast - Reviewed lifestyle modifications including: increasing physical activity - Recommend discontinuation of glimepiride . Patient has been filling glimepiride  consistently since July 2024, reports taking in the morning with breakfast daily. Discussed increased hypoglycemic risk and that endo stopped last year. Will collaborate with PCP to discontinue and update medication list.  - Discussed Clarity app in detail, patient doesn't fully understand how to navigate it. Will meet in-person to review. - Discuss statin nonadherence, will assist will rx renewal.  Follow Up Plan: Will follow-up with patient in 2 weeks for CGM training.   Livia Riffle, PharmD Clinical Pharmacist  [phone]

## 2024-01-21 ENCOUNTER — Telehealth: Payer: Self-pay

## 2024-01-21 NOTE — Progress Notes (Signed)
   01/21/2024  Patient ID: Kimberly [name], female   DOB: [birth year], 63 y.o.   MRN: [id number]  Called patient to discuss rescheduling appointment for CGM training due to being out of office. Left HIPAA compliant voicemail, will call patient back to reschedule.

## 2024-02-07 DIAGNOSIS — E1165 Type 2 diabetes mellitus with hyperglycemia: Secondary | ICD-10-CM | POA: Diagnosis not present

## 2024-02-07 DIAGNOSIS — Z794 Long term (current) use of insulin: Secondary | ICD-10-CM | POA: Diagnosis not present

## 2024-02-07 DIAGNOSIS — E782 Mixed hyperlipidemia: Secondary | ICD-10-CM | POA: Diagnosis not present

## 2024-02-07 DIAGNOSIS — I10 Essential (primary) hypertension: Secondary | ICD-10-CM | POA: Diagnosis not present

## 2024-02-11 NOTE — Progress Notes (Signed)
   02/11/2024  Patient ID: Kimberly [name], female   DOB: 10-06-1960, 64 y.o.   MRN: [id number]  Called patient to discuss CGM training. Left HIPAA compliant voicemail.

## 2024-02-13 ENCOUNTER — Other Ambulatory Visit: Payer: Self-pay | Admitting: Physician Assistant

## 2024-02-13 DIAGNOSIS — Z1231 Encounter for screening mammogram for malignant neoplasm of breast: Secondary | ICD-10-CM

## 2024-02-27 DIAGNOSIS — Z794 Long term (current) use of insulin: Secondary | ICD-10-CM | POA: Diagnosis not present

## 2024-02-27 DIAGNOSIS — E1165 Type 2 diabetes mellitus with hyperglycemia: Secondary | ICD-10-CM | POA: Diagnosis not present

## 2024-03-02 NOTE — Progress Notes (Signed)
   03/02/2024  Patient ID: Kimberly [name], female   DOB: 25-Jul-1961, 64 y.o.   MRN: [id number]  Called patient to discuss CGM training. I was able to speak to the patient today, she in no longer interested in CGM training at this time. She will follow up with me if she has any questions at a later time. The patient declines future pharmacy engagement at this time.   The Population Health team is pleased to engage with this patient at any time in the future upon receipt of referral and should he/she be interested in assistance from the [facility] team.  Heather Factor, PharmD Clinical Pharmacist  [phone]

## 2024-03-05 DIAGNOSIS — Z8673 Personal history of transient ischemic attack (TIA), and cerebral infarction without residual deficits: Secondary | ICD-10-CM | POA: Diagnosis not present

## 2024-03-05 DIAGNOSIS — Z7901 Long term (current) use of anticoagulants: Secondary | ICD-10-CM | POA: Diagnosis not present

## 2024-03-05 DIAGNOSIS — Z5181 Encounter for therapeutic drug level monitoring: Secondary | ICD-10-CM | POA: Diagnosis not present

## 2024-03-19 DIAGNOSIS — L819 Disorder of pigmentation, unspecified: Secondary | ICD-10-CM | POA: Diagnosis not present

## 2024-03-19 DIAGNOSIS — Z1231 Encounter for screening mammogram for malignant neoplasm of breast: Secondary | ICD-10-CM | POA: Diagnosis not present

## 2024-04-07 DIAGNOSIS — Z1231 Encounter for screening mammogram for malignant neoplasm of breast: Secondary | ICD-10-CM | POA: Diagnosis not present

## 2024-04-07 DIAGNOSIS — R92323 Mammographic fibroglandular density, bilateral breasts: Secondary | ICD-10-CM | POA: Diagnosis not present

## 2024-04-09 DIAGNOSIS — D229 Melanocytic nevi, unspecified: Secondary | ICD-10-CM | POA: Diagnosis not present

## 2024-04-14 ENCOUNTER — Ambulatory Visit (INDEPENDENT_AMBULATORY_CARE_PROVIDER_SITE_OTHER): Admitting: Podiatry

## 2024-04-14 ENCOUNTER — Encounter: Payer: Self-pay | Admitting: Podiatry

## 2024-04-14 DIAGNOSIS — M79674 Pain in right toe(s): Secondary | ICD-10-CM | POA: Diagnosis not present

## 2024-04-14 DIAGNOSIS — M79675 Pain in left toe(s): Secondary | ICD-10-CM | POA: Diagnosis not present

## 2024-04-14 DIAGNOSIS — E1142 Type 2 diabetes mellitus with diabetic polyneuropathy: Secondary | ICD-10-CM | POA: Diagnosis not present

## 2024-04-14 DIAGNOSIS — B351 Tinea unguium: Secondary | ICD-10-CM | POA: Diagnosis not present

## 2024-04-16 DIAGNOSIS — Z5181 Encounter for therapeutic drug level monitoring: Secondary | ICD-10-CM | POA: Diagnosis not present

## 2024-04-16 DIAGNOSIS — Z7901 Long term (current) use of anticoagulants: Secondary | ICD-10-CM | POA: Diagnosis not present

## 2024-04-16 DIAGNOSIS — Z8673 Personal history of transient ischemic attack (TIA), and cerebral infarction without residual deficits: Secondary | ICD-10-CM | POA: Diagnosis not present

## 2024-04-19 ENCOUNTER — Encounter: Payer: Self-pay | Admitting: Podiatry

## 2024-04-19 NOTE — Progress Notes (Signed)
  Subjective:  Patient ID: Kimberly [name], female    DOB: Mar 26, 1961,  MRN: [id number]  Kimberly [name] presents to clinic today for at risk foot care with history of diabetic neuropathy and painful thick toenails that are difficult to trim. Pain interferes with ambulation. Aggravating factors include wearing enclosed shoe gear. Pain is relieved with periodic professional debridement. Patient states she has a new AFO for her LLE and a new motorized chair which is lighter for her husband to lift. Chief Complaint  Patient presents with   [facility]    Rm17 Diabetic foot care/ Dr. Joelyn Lares Last visit March 13,2025/A1C8   New problem(s): None.   PCP is Osei-Bonsu, Zachary, MD.  No Known Allergies  Review of Systems: Negative except as noted in the HPI.  Objective: No changes noted in today's physical examination. There were no vitals filed for this visit. Kimberly [name] is a pleasant 63 y.o. female obese in NAD. AAO x 3.  Vascular Examination: Capillary refill time <3 seconds b/l.  Vascular status intact b/l with palpable DP pulses; nonpalpable PT pulses b/l. Pedal hair absent b/l. No pain with calf compression b/l. Skin temperature gradient WNL b/l. No cyanosis or clubbing b/l. No ischemia or gangrene noted b/l. Dependent edema noted b/l LE.  Neurological Examination: Sensation grossly intact b/l with 10 gram monofilament. Vibratory sensation intact b/l. Pt has subjective symptoms of neuropathy.  Dermatological Examination: Pedal skin with normal turgor, texture and tone b/l.  No open wounds. No interdigital macerations.   Toenails 1-5 b/l thick, discolored, elongated with subungual debris and pain on dorsal palpation.   No corns, calluses nor porokeratotic lesions noted.  Musculoskeletal Examination: Dropfoot left lower extremity. Equinus LLE. Has new AFO LLE and new motorized chair.   Radiographs: None  Assessment/Plan: 1. Pain due to onychomycosis of toenails of both feet   2. Diabetic  peripheral neuropathy associated with type 2 diabetes mellitus (HCC)   -Caregiver/provider present with patient on today's visit. -Continue foot and shoe inspections daily. Monitor blood glucose per PCP/Endocrinologist's recommendations. -Toenails 1-5 b/l were debrided in length and girth with sterile nail nippers and dremel without iatrogenic bleeding.  -Patient/POA to call should there be question/concern in the interim.   Return in about 3 months (around 07/15/2024).  Delon LITTIE Merlin, DPM      [place] LOCATION: [address] [place] [address]                   Office [phone]   [facility] LOCATION: [address] [place], [place] [address] Office [phone]

## 2024-05-08 DIAGNOSIS — Z794 Long term (current) use of insulin: Secondary | ICD-10-CM | POA: Diagnosis not present

## 2024-05-08 DIAGNOSIS — I10 Essential (primary) hypertension: Secondary | ICD-10-CM | POA: Diagnosis not present

## 2024-05-08 DIAGNOSIS — E782 Mixed hyperlipidemia: Secondary | ICD-10-CM | POA: Diagnosis not present

## 2024-05-08 DIAGNOSIS — E1165 Type 2 diabetes mellitus with hyperglycemia: Secondary | ICD-10-CM | POA: Diagnosis not present

## 2024-05-26 DIAGNOSIS — Z7901 Long term (current) use of anticoagulants: Secondary | ICD-10-CM | POA: Diagnosis not present

## 2024-05-26 DIAGNOSIS — Z5181 Encounter for therapeutic drug level monitoring: Secondary | ICD-10-CM | POA: Diagnosis not present

## 2024-05-28 DIAGNOSIS — H401112 Primary open-angle glaucoma, right eye, moderate stage: Secondary | ICD-10-CM | POA: Diagnosis not present

## 2024-05-28 DIAGNOSIS — E113312 Type 2 diabetes mellitus with moderate nonproliferative diabetic retinopathy with macular edema, left eye: Secondary | ICD-10-CM | POA: Diagnosis not present

## 2024-05-28 DIAGNOSIS — H401121 Primary open-angle glaucoma, left eye, mild stage: Secondary | ICD-10-CM | POA: Diagnosis not present

## 2024-06-08 DIAGNOSIS — I6930 Unspecified sequelae of cerebral infarction: Secondary | ICD-10-CM | POA: Diagnosis not present

## 2024-07-29 ENCOUNTER — Encounter: Payer: Self-pay | Admitting: Podiatry

## 2024-07-29 ENCOUNTER — Ambulatory Visit: Admitting: Podiatry

## 2024-07-29 DIAGNOSIS — E119 Type 2 diabetes mellitus without complications: Secondary | ICD-10-CM

## 2024-07-29 DIAGNOSIS — E1142 Type 2 diabetes mellitus with diabetic polyneuropathy: Secondary | ICD-10-CM

## 2024-07-29 DIAGNOSIS — M216X2 Other acquired deformities of left foot: Secondary | ICD-10-CM

## 2024-07-29 DIAGNOSIS — B351 Tinea unguium: Secondary | ICD-10-CM | POA: Diagnosis not present

## 2024-07-29 DIAGNOSIS — Z0189 Encounter for other specified special examinations: Secondary | ICD-10-CM

## 2024-07-29 DIAGNOSIS — M79674 Pain in right toe(s): Secondary | ICD-10-CM | POA: Diagnosis not present

## 2024-07-29 DIAGNOSIS — M21372 Foot drop, left foot: Secondary | ICD-10-CM | POA: Diagnosis not present

## 2024-07-29 DIAGNOSIS — M79675 Pain in left toe(s): Secondary | ICD-10-CM

## 2024-08-02 NOTE — Progress Notes (Signed)
  Subjective:  Patient ID: Kimberly [name], female    DOB: Nov 04, 1960,  MRN: [id number]  Kimberly [name] presents to clinic today for for annual diabetic foot examination, at risk foot care with history of diabetic neuropathy, and painful mycotic toenails of both feet that are difficult to trim. Pain interferes with daily activities and wearing enclosed shoe gear comfortably.  Chief Complaint  Patient presents with   [facility]    Rm15 Diabeitc foot care/ A1c 7.8/ Dr. Zachary Berg Bonsu   New problem(s): None.   PCP is Catalina Zachary, MD. Kimberly [name] 04/09/24.  No Known Allergies  Review of Systems: Negative except as noted in the HPI.  Objective: No changes noted in today's physical examination. There were no vitals filed for this visit. Kimberly [name] is a pleasant 63 y.o. female obese in NAD. AAO x 3.   Diabetic foot exam was performed with the following findings:   Vascular Examination: CFT less than 3 seconds. DP pulses palpable b/l. PT pulses nonpalpable b/l. Digital hair absent. Skin temperature gradient warm to warn b/l. No ischemia or gangrene. No cyanosis or clubbing noted b/l. Dependent edema noted b/l LE.   Neurological Examination: Sensation grossly intact b/l with 10 gram monofilament. Vibratory sensation intact b/l. Pt has subjective symptoms of neuropathy.  Dermatological Examination: Pedal skin thin, shiny and atrophic b/l. No open wounds. No interdigital macerations.   Toenails 1-5 b/l thick, discolored, elongated with subungual debris and pain on dorsal palpation.   No hyperkeratotic nor porokeratotic lesions.  Musculoskeletal Examination: Dropfoot left lower extremity. Wearing AFO on left lower extremity. Utilizes motorized chair for mobility assistance.  Radiographs: None     Assessment/Plan: 1. Pain due to onychomycosis of toenails of both feet   2. Left foot drop   3. Acquired equinus deformity of left foot   4. Diabetic peripheral neuropathy associated with type 2  diabetes mellitus (HCC)   5. Encounter for diabetic foot exam [facility])   Consent given for treatment. Diabetic foot examination performed.. All patient's and/or POA's questions/concerns addressed on today's visit. Mycotic toenails 1-5 b/l debrided in length and girth without incident. Continue foot and shoe inspections daily. Monitor blood glucose per PCP/Endocrinologist's recommendations.Continue soft, supportive shoe gear daily. Report any pedal injuries to medical professional. Call office if there are any quesitons/concerns. -Patient/POA to call should there be question/concern in the interim.   Return in about 3 months (around 10/27/2024).  Kimberly [name], DPM      [place] LOCATION: [address], [place] [address]                   Office [phone]   [facility] LOCATION: [address] [place], [place] [address] Office [phone]

## 2024-09-01 NOTE — Progress Notes (Signed)
 Kimberly [name]                                          MRN: [id number]   09/01/2024   The [facility] Quality Team Specialist reviewed this patient medical record for the purposes of chart review for care gap closure. The following were reviewed: abstraction for care gap closure-glycemic status assessment.    [facility] Quality Team

## 2024-11-09 ENCOUNTER — Ambulatory Visit: Admitting: Physician Assistant

## 2024-11-17 ENCOUNTER — Ambulatory Visit: Admitting: Podiatry
